# Patient Record
Sex: Female | Born: 1965 | Race: White | Hispanic: No | Marital: Married | Smoking: Former smoker
Health system: Southern US, Community
[De-identification: ages and names within clinical notes are randomized; demographics above are authoritative.]

## PROBLEM LIST (undated history)

## (undated) DIAGNOSIS — Z923 Personal history of irradiation: Secondary | ICD-10-CM

## (undated) DIAGNOSIS — K219 Gastro-esophageal reflux disease without esophagitis: Secondary | ICD-10-CM

## (undated) DIAGNOSIS — C50512 Malignant neoplasm of lower-outer quadrant of left female breast: Secondary | ICD-10-CM

## (undated) DIAGNOSIS — C801 Malignant (primary) neoplasm, unspecified: Secondary | ICD-10-CM

## (undated) DIAGNOSIS — Z9221 Personal history of antineoplastic chemotherapy: Secondary | ICD-10-CM

## (undated) DIAGNOSIS — Z803 Family history of malignant neoplasm of breast: Secondary | ICD-10-CM

## (undated) HISTORY — PX: BREAST LUMPECTOMY: SHX2

## (undated) HISTORY — PX: DILATION AND CURETTAGE OF UTERUS: SHX78

## (undated) HISTORY — DX: Malignant (primary) neoplasm, unspecified: C80.1

## (undated) HISTORY — DX: Family history of malignant neoplasm of breast: Z80.3

## (undated) HISTORY — DX: Gastro-esophageal reflux disease without esophagitis: K21.9

## (undated) HISTORY — DX: Malignant neoplasm of lower-outer quadrant of left female breast: C50.512

## (undated) HISTORY — PX: URETHRAL DILATION: SUR417

---

## 1998-08-23 ENCOUNTER — Ambulatory Visit (HOSPITAL_COMMUNITY): Admission: RE | Admit: 1998-08-23 | Discharge: 1998-08-23 | Payer: Self-pay | Admitting: Obstetrics and Gynecology

## 1998-12-08 ENCOUNTER — Inpatient Hospital Stay (HOSPITAL_COMMUNITY): Admission: AD | Admit: 1998-12-08 | Discharge: 1998-12-08 | Payer: Self-pay | Admitting: Obstetrics and Gynecology

## 1998-12-10 ENCOUNTER — Ambulatory Visit (HOSPITAL_COMMUNITY): Admission: RE | Admit: 1998-12-10 | Discharge: 1998-12-10 | Payer: Self-pay | Admitting: Obstetrics and Gynecology

## 1998-12-17 ENCOUNTER — Ambulatory Visit (HOSPITAL_COMMUNITY): Admission: AD | Admit: 1998-12-17 | Discharge: 1998-12-17 | Payer: Self-pay | Admitting: *Deleted

## 1999-01-26 ENCOUNTER — Other Ambulatory Visit: Admission: RE | Admit: 1999-01-26 | Discharge: 1999-01-26 | Payer: Self-pay | Admitting: Obstetrics and Gynecology

## 1999-10-15 ENCOUNTER — Inpatient Hospital Stay (HOSPITAL_COMMUNITY): Admission: AD | Admit: 1999-10-15 | Discharge: 1999-10-15 | Payer: Self-pay | Admitting: Obstetrics and Gynecology

## 1999-10-18 ENCOUNTER — Inpatient Hospital Stay (HOSPITAL_COMMUNITY): Admission: EM | Admit: 1999-10-18 | Discharge: 1999-10-18 | Payer: Self-pay | Admitting: Obstetrics and Gynecology

## 1999-10-18 ENCOUNTER — Encounter: Payer: Self-pay | Admitting: Obstetrics and Gynecology

## 1999-10-26 ENCOUNTER — Inpatient Hospital Stay (HOSPITAL_COMMUNITY): Admission: AD | Admit: 1999-10-26 | Discharge: 1999-10-29 | Payer: Self-pay | Admitting: Obstetrics and Gynecology

## 1999-11-02 ENCOUNTER — Encounter: Admission: RE | Admit: 1999-11-02 | Discharge: 2000-01-31 | Payer: Self-pay | Admitting: Obstetrics and Gynecology

## 1999-12-06 ENCOUNTER — Other Ambulatory Visit: Admission: RE | Admit: 1999-12-06 | Discharge: 1999-12-06 | Payer: Self-pay | Admitting: Obstetrics and Gynecology

## 2000-02-02 ENCOUNTER — Encounter: Admission: RE | Admit: 2000-02-02 | Discharge: 2000-05-02 | Payer: Self-pay | Admitting: Obstetrics and Gynecology

## 2001-04-07 ENCOUNTER — Other Ambulatory Visit: Admission: RE | Admit: 2001-04-07 | Discharge: 2001-04-07 | Payer: Self-pay | Admitting: Family Medicine

## 2001-05-29 ENCOUNTER — Encounter: Admission: RE | Admit: 2001-05-29 | Discharge: 2001-05-29 | Payer: Self-pay | Admitting: Family Medicine

## 2001-05-29 ENCOUNTER — Encounter: Payer: Self-pay | Admitting: Family Medicine

## 2001-09-18 ENCOUNTER — Other Ambulatory Visit: Admission: RE | Admit: 2001-09-18 | Discharge: 2001-09-18 | Payer: Self-pay | Admitting: Family Medicine

## 2002-10-29 ENCOUNTER — Other Ambulatory Visit: Admission: RE | Admit: 2002-10-29 | Discharge: 2002-10-29 | Payer: Self-pay | Admitting: Family Medicine

## 2003-11-25 ENCOUNTER — Other Ambulatory Visit: Admission: RE | Admit: 2003-11-25 | Discharge: 2003-11-25 | Payer: Self-pay | Admitting: Family Medicine

## 2005-01-18 ENCOUNTER — Other Ambulatory Visit: Admission: RE | Admit: 2005-01-18 | Discharge: 2005-01-18 | Payer: Self-pay | Admitting: Family Medicine

## 2006-02-14 ENCOUNTER — Other Ambulatory Visit: Admission: RE | Admit: 2006-02-14 | Discharge: 2006-02-14 | Payer: Self-pay | Admitting: Family Medicine

## 2007-03-27 ENCOUNTER — Other Ambulatory Visit: Admission: RE | Admit: 2007-03-27 | Discharge: 2007-03-27 | Payer: Self-pay | Admitting: Family Medicine

## 2013-07-02 ENCOUNTER — Ambulatory Visit (INDEPENDENT_AMBULATORY_CARE_PROVIDER_SITE_OTHER): Payer: BC Managed Care – PPO | Admitting: Nurse Practitioner

## 2013-07-02 ENCOUNTER — Encounter: Payer: Self-pay | Admitting: Nurse Practitioner

## 2013-07-02 VITALS — BP 126/77 | HR 72 | Temp 97.9°F | Ht 65.5 in | Wt 208.0 lb

## 2013-07-02 DIAGNOSIS — Z01419 Encounter for gynecological examination (general) (routine) without abnormal findings: Secondary | ICD-10-CM

## 2013-07-02 DIAGNOSIS — Z124 Encounter for screening for malignant neoplasm of cervix: Secondary | ICD-10-CM

## 2013-07-02 DIAGNOSIS — Z0000 Encounter for general adult medical examination without abnormal findings: Secondary | ICD-10-CM

## 2013-07-02 LAB — POCT URINALYSIS DIPSTICK
Bilirubin, UA: NEGATIVE
Blood, UA: NEGATIVE
Glucose, UA: NEGATIVE
Ketones, UA: NEGATIVE
Leukocytes, UA: NEGATIVE
Nitrite, UA: NEGATIVE
Spec Grav, UA: 1.01
Urobilinogen, UA: NEGATIVE
pH, UA: 7.5

## 2013-07-02 LAB — POCT CBC
Granulocyte percent: 63.3 %G (ref 37–80)
HCT, POC: 40.2 % (ref 37.7–47.9)
Hemoglobin: 14.3 g/dL (ref 12.2–16.2)
Lymph, poc: 2.9 (ref 0.6–3.4)
MCH, POC: 32.2 pg — AB (ref 27–31.2)
MCHC: 35.7 g/dL — AB (ref 31.8–35.4)
MCV: 90.2 fL (ref 80–97)
MPV: 8 fL (ref 0–99.8)
POC Granulocyte: 5.2 (ref 2–6.9)
POC LYMPH PERCENT: 35.4 %L (ref 10–50)
Platelet Count, POC: 299 10*3/uL (ref 142–424)
RBC: 4.5 M/uL (ref 4.04–5.48)
RDW, POC: 14.2 %
WBC: 8.2 10*3/uL (ref 4.6–10.2)

## 2013-07-02 LAB — POCT UA - MICROSCOPIC ONLY
Crystals, Ur, HPF, POC: NEGATIVE
Yeast, UA: NEGATIVE

## 2013-07-02 NOTE — Patient Instructions (Addendum)

## 2013-07-02 NOTE — Progress Notes (Signed)
  Subjective:    Patient ID: Shannon Logan, female    DOB: 1966/07/19, 47 y.o.   MRN: [id number]  HPI Pt here for annual physical with pap. Pt has no concerns or complaints at this time.     Review of Systems  All other systems reviewed and are negative.       Objective:   Physical Exam  Constitutional: She is oriented to person, place, and time. She appears well-developed and well-nourished.  HENT:  Head: Normocephalic.  Right Ear: External ear normal.  Left Ear: External ear normal.  Eyes: Pupils are equal, round, and reactive to light.  Neck: Normal range of motion. Neck supple. No thyromegaly present.  Cardiovascular: Normal rate, regular rhythm, normal heart sounds and intact distal pulses.   Pulmonary/Chest: Effort normal and breath sounds normal.  Abdominal: Soft. Bowel sounds are normal.  Musculoskeletal: Normal range of motion. She exhibits no edema and no tenderness.  Neurological: She is alert and oriented to person, place, and time. She has normal reflexes.  Skin: Skin is warm and dry.  Psychiatric: She has a normal mood and affect. Her behavior is normal. Judgment and thought content normal.      BP 126/77  Pulse 72  Temp(Src) 97.9 F (36.6 C) (Oral)  Ht 5' 5.5" (1.664 m)  Wt 208 lb (94.348 kg)  BMI 34.07 kg/m2     Assessment & Plan:  1. Annual physical exam Low fat diet and exercise encouraged - POCT urinalysis dipstick - POCT UA - Microscopic Only  2. Encounter for routine gynecological examination Pap pending   Orders Placed This Encounter  Procedures  . COMPLETE METABOLIC PANEL WITH GFR  . NMR Lipoprofile with Lipids  . Thyroid Panel With TSH  . POCT urinalysis dipstick  . POCT UA - Microscopic Only  . POCT CBC    Mary-Margaret Daphine Deutscher, FNP

## 2013-07-03 LAB — THYROID PANEL WITH TSH
Free Thyroxine Index: 2.9 (ref 1.0–3.9)
T3 Uptake: 35.7 % (ref 22.5–37.0)
T4, Total: 8.2 ug/dL (ref 5.0–12.5)
TSH: 3.704 u[IU]/mL (ref 0.350–4.500)

## 2013-07-03 LAB — COMPLETE METABOLIC PANEL WITH GFR
ALT: 24 U/L (ref 0–35)
AST: 23 U/L (ref 0–37)
Albumin: 4.3 g/dL (ref 3.5–5.2)
Alkaline Phosphatase: 117 U/L (ref 39–117)
BUN: 15 mg/dL (ref 6–23)
CO2: 32 mEq/L (ref 19–32)
Calcium: 9.5 mg/dL (ref 8.4–10.5)
Chloride: 106 mEq/L (ref 96–112)
Creat: 0.78 mg/dL (ref 0.50–1.10)
GFR, Est African American: >89 mL/min
GFR, Est Non African American: >89 mL/min
Glucose, Bld: 95 mg/dL (ref 70–99)
Potassium: 4.3 mEq/L (ref 3.5–5.3)
Sodium: 142 mEq/L (ref 135–145)
Total Bilirubin: 0.8 mg/dL (ref 0.3–1.2)
Total Protein: 7.2 g/dL (ref 6.0–8.3)

## 2013-07-05 LAB — PAP IG W/ RFLX HPV ASCU

## 2013-07-06 LAB — NMR LIPOPROFILE WITH LIPIDS
Cholesterol, Total: 190 mg/dL (ref <200)
HDL Particle Number: 31 umol/L (ref >=30.5)
HDL Size: 8.4 nm — ABNORMAL LOW (ref >=9.2)
HDL-C: 41 mg/dL (ref >=40)
LDL (calc): 114 mg/dL — ABNORMAL HIGH (ref <100)
LDL Particle Number: 1707 nmol/L — ABNORMAL HIGH (ref <1000)
LDL Size: 20.3 nm — ABNORMAL LOW (ref >20.5)
LP-IR Score: 81 — ABNORMAL HIGH (ref <=45)
Large HDL-P: 2 umol/L — ABNORMAL LOW (ref >=4.8)
Large VLDL-P: 5.6 nmol/L — ABNORMAL HIGH (ref <=2.7)
Small LDL Particle Number: 1059 nmol/L — ABNORMAL HIGH (ref <=527)
Triglycerides: 176 mg/dL — ABNORMAL HIGH (ref <150)
VLDL Size: 52.4 nm — ABNORMAL HIGH (ref <=46.6)

## 2013-08-27 ENCOUNTER — Telehealth: Payer: Self-pay | Admitting: Nurse Practitioner

## 2013-08-27 DIAGNOSIS — G8929 Other chronic pain: Secondary | ICD-10-CM

## 2013-08-27 DIAGNOSIS — D229 Melanocytic nevi, unspecified: Secondary | ICD-10-CM

## 2013-08-27 NOTE — Telephone Encounter (Signed)
Referrals made

## 2013-10-13 ENCOUNTER — Encounter: Payer: Self-pay | Admitting: Nurse Practitioner

## 2013-10-15 ENCOUNTER — Encounter: Payer: Self-pay | Admitting: Nurse Practitioner

## 2013-10-15 ENCOUNTER — Ambulatory Visit (INDEPENDENT_AMBULATORY_CARE_PROVIDER_SITE_OTHER): Payer: BC Managed Care – PPO | Admitting: Nurse Practitioner

## 2013-10-15 VITALS — BP 139/82 | HR 76 | Temp 97.5°F | Ht 65.5 in | Wt 204.5 lb

## 2013-10-15 DIAGNOSIS — Z1273 Encounter for screening for malignant neoplasm of ovary: Secondary | ICD-10-CM

## 2013-10-15 DIAGNOSIS — E785 Hyperlipidemia, unspecified: Secondary | ICD-10-CM

## 2013-10-15 NOTE — Patient Instructions (Signed)
Ovarian Cancer The ovaries are the parts of the female reproductive system that produce eggs. Women have two ovaries. They are located on either side of the uterus. Ovarian cancer is an abnormal growth of tissue (tumor) in one or both ovaries that is cancerous (malignant). Unlike noncancerous (benign) tumors, malignant tumors can spread to other parts of your body.  CAUSES  The exact cause of ovarian cancer is unknown. RISK FACTORS There are a number of risk factors that can increase your chances of getting ovarian cancer. They include:  Age. Ovarian cancer is most common in women aged 49 75 years.  Being Caucasian.  Personal or family history of endometrial, colon, breast, or ovarian cancer.  Having the BRCA 1 and BRCA 2 genes.  Using fertility medicines.  Starting menstruation before the age of 12 years.  Starting menopause after the age of 14 years.  Becoming pregnant for the first time at the age of 39 years or older.  Never being pregnant.  Having hormone replacement therapy.  Eating high amounts of animal fat. SYMPTOMS  Early ovarian cancer often does not cause symptoms. As the cancer grows, symptoms may include:  Unexplained weight loss.  Abdominal pain, swelling, or bloating.  Pain and pressure in the back and pelvis.  Abnormal vaginal bleeding.  Loss of appetite.  Frequent urination.  Pain during intercourse.  Fatigue. DIAGNOSIS  Your caregiver will ask about your medical history. He or she may also perform a number of procedures, such as:  A pelvic exam. Your caregiver will feel the organs in the pelvis for any lumps or changes in their shape or size.  Imaging tests, such as computed tomography (CT) scans, ultrasound tests, or magnetic resonance imaging (MRI).  Blood tests. Your cancer will be staged to determine its severity and extent. Staging is a careful attempt to find out the size of the tumor, whether the cancer has spread, and if so, to what  parts of the body. You may need to have more tests to determine the stage of your cancer. The test results will help determine what treatment plan is best for you. STAGES   Stage I. The cancer is found in only one or both ovaries.  Stage II. The cancer has spread to other parts of the pelvis, such as the uterus or fallopian tubes.  Stage III. The cancer has spread outside the pelvis to the abdominal cavity or to the lymph nodes in the abdomen.  Stage IV. The cancer has spread outside the abdomen to areas such as the liver or lungs. TREATMENT  Most women with ovarian cancer are treated with a combination of surgery and chemotherapy.   If the cancer is found at an early stage, surgery may be done to remove one ovary and its fallopian tube.  For more advanced cases, surgery may be done to remove the uterus, cervix, fallopian tubes, and ovaries. Your caregiver may also remove the lymph nodes near the tumor and some tissue in the abdomen.  Chemotherapy is the use of drugs to kill cancer cells. Chemotherapy may be used before or after the surgery. HOME CARE INSTRUCTIONS   Only take over-the-counter or prescription medicines as directed by your caregiver.  Maintain a healthy diet.  Consider joining a support group. If you are feeling stressed because you have ovarian cancer, a support group may help you learn to cope with the disease.  Seek advice to help you manage the treatment of side effects.  Keep all follow-up appointments as  directed by your caregiver. SEEK IMMEDIATE MEDICAL CARE IF:   You have new or worsening symptoms.  You have a fever during your chemotherapy treatment. Document Released: 10/22/2004 Document Revised: 11/18/2012 Document Reviewed: 08/20/2012 [facility] Patient Information 2014 [place], [place].

## 2013-10-15 NOTE — Progress Notes (Signed)
  Subjective:    Patient ID: Shannon Logan, female    DOB: [birth year], 47 y.o.   MRN: [id number]  Hyperlipidemia This is a chronic problem. The current episode started more than 1 year ago. The problem is uncontrolled. Recent lipid tests were reviewed and are high. She has no history of diabetes, hypothyroidism, liver disease, obesity or nephrotic syndrome. There are no known factors aggravating her hyperlipidemia. Pertinent negatives include no focal sensory loss, focal weakness, leg pain, myalgias or shortness of breath. Current antihyperlipidemic treatment includes diet change.   *patient concerned about blotting and slight pain with intercourse- would like to be checked for ovarian Cancer.    Review of Systems  Respiratory: Negative for shortness of breath.   Musculoskeletal: Negative for myalgias.  Neurological: Negative for focal weakness.  All other systems reviewed and are negative.       Objective:   Physical Exam  Constitutional: She is oriented to person, place, and time. She appears well-developed and well-nourished.  HENT:  Right Ear: External ear normal.  Left Ear: External ear normal.  Nose: Nose normal.  Mouth/Throat: Oropharynx is clear and moist.  Eyes: EOM are normal. Pupils are equal, round, and reactive to light.  Neck: Normal range of motion. Neck supple.  Cardiovascular: Normal rate and normal heart sounds.   Pulmonary/Chest: Effort normal and breath sounds normal.  Abdominal: Soft. Bowel sounds are normal.  Musculoskeletal: Normal range of motion.  Neurological: She is alert and oriented to person, place, and time.  Psychiatric: She has a normal mood and affect. Her behavior is normal. Judgment and thought content normal.    BP 139/82  Pulse 76  Temp(Src) 97.5 F (36.4 C) (Oral)  Ht 5' 5.5" (1.664 m)  Wt 204 lb 8 oz (92.761 kg)  BMI 33.5 kg/m2       Assessment & Plan:   1. Hyperlipidemia LDL goal < 100   2. Ovarian cancer screening     Orders Placed This Encounter  Procedures  . CMP14+EGFR  . NMR, lipoprofile  . CA 125     Continue all meds Labs pending Diet and exercise encouraged Health maintenance reviewed Follow up in 6 months  Mary-Margaret Daphine Deutscher, FNP

## 2013-10-17 LAB — NMR, LIPOPROFILE
Cholesterol: 171 mg/dL
HDL Cholesterol by NMR: 46 mg/dL
HDL Particle Number: 33.2 umol/L
LDL Particle Number: 1442 nmol/L — ABNORMAL HIGH
LDL Size: 20.3 nm — ABNORMAL LOW
LDLC SERPL CALC-MCNC: 103 mg/dL — ABNORMAL HIGH
LP-IR Score: 71 — ABNORMAL HIGH
Small LDL Particle Number: 742 nmol/L — ABNORMAL HIGH
Triglycerides by NMR: 109 mg/dL

## 2013-10-17 LAB — CMP14+EGFR
ALT: 30 [IU]/L (ref 0–32)
AST: 24 [IU]/L (ref 0–40)
Albumin/Globulin Ratio: 1.9 (ref 1.1–2.5)
Albumin: 4.6 g/dL (ref 3.5–5.5)
Alkaline Phosphatase: 121 [IU]/L — ABNORMAL HIGH (ref 39–117)
BUN/Creatinine Ratio: 19 (ref 9–23)
BUN: 15 mg/dL (ref 6–24)
CO2: 26 mmol/L (ref 18–29)
Calcium: 10.3 mg/dL — ABNORMAL HIGH (ref 8.7–10.2)
Chloride: 101 mmol/L (ref 97–108)
Creatinine, Ser: 0.78 mg/dL (ref 0.57–1.00)
GFR calc Af Amer: 104 mL/min/{1.73_m2}
GFR calc non Af Amer: 90 mL/min/{1.73_m2}
Globulin, Total: 2.4 g/dL (ref 1.5–4.5)
Glucose: 99 mg/dL (ref 65–99)
Potassium: 4.8 mmol/L (ref 3.5–5.2)
Sodium: 142 mmol/L (ref 134–144)
Total Bilirubin: 0.7 mg/dL (ref 0.0–1.2)
Total Protein: 7 g/dL (ref 6.0–8.5)

## 2013-12-27 ENCOUNTER — Telehealth: Payer: Self-pay | Admitting: Nurse Practitioner

## 2013-12-27 NOTE — Telephone Encounter (Signed)
appt made

## 2013-12-31 ENCOUNTER — Ambulatory Visit (INDEPENDENT_AMBULATORY_CARE_PROVIDER_SITE_OTHER): Payer: Self-pay | Admitting: Family Medicine

## 2013-12-31 ENCOUNTER — Encounter: Payer: Self-pay | Admitting: Family Medicine

## 2013-12-31 ENCOUNTER — Ambulatory Visit (INDEPENDENT_AMBULATORY_CARE_PROVIDER_SITE_OTHER): Payer: BC Managed Care – PPO

## 2013-12-31 VITALS — BP 125/77 | HR 74 | Temp 98.0°F | Wt 203.6 lb

## 2013-12-31 DIAGNOSIS — R0781 Pleurodynia: Secondary | ICD-10-CM

## 2013-12-31 DIAGNOSIS — R079 Chest pain, unspecified: Secondary | ICD-10-CM

## 2013-12-31 MED ORDER — NAPROXEN 500 MG PO TABS: 500.0000 mg | ORAL_TABLET | Freq: Two times a day (BID) | ORAL | Status: DC

## 2013-12-31 MED ORDER — CYCLOBENZAPRINE HCL 10 MG PO TABS: 10.0000 mg | ORAL_TABLET | Freq: Three times a day (TID) | ORAL | Status: DC | PRN

## 2013-12-31 NOTE — Progress Notes (Signed)
   Subjective:    Patient ID: Shannon Logan, female    DOB: 1966/04/25, 48 y.o.   MRN: [id number]  HPI  This 48 y.o. female presents for evaluation of pain in her left arm pit after she fell a few days ago. She has been seen by urgent care and she was rx'd naprosyn and muscle relaxer flexeril. She had xray of ribs and no fx.  Review of Systems  C/o left lateral chest wall discomfort No chest pain, SOB, HA, dizziness, vision change, N/V, diarrhea, constipation, dysuria, urinary urgency or frequency, myalgias, arthralgias or rash.     Objective:   Physical Exam  Vital signs noted  Well developed well nourished female.  HEENT - Head atraumatic Normocephalic                Eyes - PERRLA, Conjuctiva - clear Sclera- Clear EOMI Respiratory - Lungs CTA bilateral Cardiac - RRR S1 and S2 without murmur GI - Abdomen soft Nontender and bowel sounds active x 4 MS - TTP Left lateral intercostal area proximal left axilla    Left rib series - Negative for fx Assessment & Plan:  Rib pain on left side - Plan: DG Ribs Unilateral Left, naproxen (NAPROSYN) 500 MG tablet, cyclobenzaprine (FLEXERIL) 10 MG tablet  Lysbeth Penner FNP

## 2013-12-31 NOTE — Patient Instructions (Signed)

## 2015-07-11 ENCOUNTER — Encounter: Payer: Self-pay | Admitting: Family Medicine

## 2015-07-11 ENCOUNTER — Ambulatory Visit (INDEPENDENT_AMBULATORY_CARE_PROVIDER_SITE_OTHER): Payer: 59 | Admitting: Family Medicine

## 2015-07-11 VITALS — BP 116/70 | HR 69 | Temp 97.8°F | Ht 65.5 in | Wt 211.0 lb

## 2015-07-11 DIAGNOSIS — R300 Dysuria: Secondary | ICD-10-CM | POA: Diagnosis not present

## 2015-07-11 DIAGNOSIS — R1011 Right upper quadrant pain: Secondary | ICD-10-CM | POA: Insufficient documentation

## 2015-07-11 LAB — POCT URINALYSIS DIPSTICK
Bilirubin, UA: NEGATIVE
GLUCOSE UA: NEGATIVE
Ketones, UA: NEGATIVE
Leukocytes, UA: NEGATIVE
Nitrite, UA: NEGATIVE
PROTEIN UA: NEGATIVE
RBC UA: NEGATIVE
Urobilinogen, UA: NEGATIVE

## 2015-07-11 LAB — POCT UA - MICROSCOPIC ONLY
BACTERIA, U MICROSCOPIC: NEGATIVE
CASTS, UR, LPF, POC: NEGATIVE
CRYSTALS, UR, HPF, POC: NEGATIVE
Mucus, UA: NEGATIVE
YEAST UA: NEGATIVE

## 2015-07-11 MED ORDER — CIPROFLOXACIN HCL 250 MG PO TABS: 250.0000 mg | ORAL_TABLET | Freq: Two times a day (BID) | ORAL | Status: DC

## 2015-07-11 NOTE — Progress Notes (Signed)
   Subjective:    Patient ID: Shannon Logan, female    DOB: Jul 27, 1966, 49 y.o.   MRN: [id number]  HPI 49 year old female with dysuria and scant urination for the past 24 hours. She has a history of bladder infections. She's had no fever chills nausea or vomiting. She has been taking Azo for relief of symptoms. There are no active problems to display for this patient.  Outpatient Encounter Prescriptions as of 07/11/2015  Medication Sig  . Phenazopyridine HCl (AZO TABS PO) Take by mouth.  . [DISCONTINUED] cyclobenzaprine (FLEXERIL) 10 MG tablet Take 1 tablet (10 mg total) by mouth 3 (three) times daily as needed for muscle spasms.  . [DISCONTINUED] naproxen (NAPROSYN) 500 MG tablet Take 1 tablet (500 mg total) by mouth 2 (two) times daily with a meal.   No facility-administered encounter medications on file as of 07/11/2015.      Review of Systems  Respiratory: Negative.   Cardiovascular: Negative.   Gastrointestinal: Positive for abdominal pain.  Genitourinary: Positive for dysuria.       Objective:   Physical Exam  Constitutional: She appears well-developed and well-nourished.  Pulmonary/Chest: Effort normal.  Abdominal: Soft. She exhibits no distension. There is no tenderness.    BP 116/70 mmHg  Pulse 69  Temp(Src) 97.8 F (36.6 C) (Oral)  Ht 5' 5.5" (1.664 m)  Wt 211 lb (95.709 kg)  BMI 34.57 kg/m2       Assessment & Plan:  1. Dysuria Again Cipro pending result of culture since this has worked before. Asked her to drink plenty water and hold off on the days ago in an effort to gauge the effectiveness of Cipro - POCT urinalysis dipstick - POCT UA - Microscopic Only - Urine culture 2. Pain Pain is mostly right upper quadrant associated with bloating after meals. Will check for gallbladder disease. If ultrasound is negative refer to GI for further evaluation  Wardell Honour MD

## 2015-07-13 LAB — URINE CULTURE

## 2015-07-17 ENCOUNTER — Telehealth: Payer: Self-pay | Admitting: *Deleted

## 2015-07-17 NOTE — Telephone Encounter (Signed)
-----   Message from Wardell Honour, MD sent at 07/15/2015 11:05 PM EDT ----- E coli; Cipro will work

## 2015-07-17 NOTE — Telephone Encounter (Signed)
Tried to call pt regarding test results but vm full couldn't leave a message.

## 2015-07-20 ENCOUNTER — Telehealth: Payer: Self-pay | Admitting: *Deleted

## 2015-07-20 NOTE — Telephone Encounter (Signed)
Multiple attempts to call patient.  This encounter will now be closed

## 2015-07-20 NOTE — Telephone Encounter (Signed)
-----   Message from Wardell Honour, MD sent at 07/15/2015 11:05 PM EDT ----- E coli; Cipro will work

## 2015-07-21 ENCOUNTER — Telehealth: Payer: Self-pay | Admitting: Family Medicine

## 2015-07-28 ENCOUNTER — Telehealth: Payer: Self-pay | Admitting: Family Medicine

## 2015-07-28 ENCOUNTER — Ambulatory Visit (HOSPITAL_COMMUNITY)
Admission: RE | Admit: 2015-07-28 | Discharge: 2015-07-28 | Disposition: A | Discharge status: Home / Self Care | Payer: 59 | Source: Ambulatory Visit | Attending: Family Medicine | Admitting: Family Medicine

## 2015-07-28 DIAGNOSIS — K769 Liver disease, unspecified: Secondary | ICD-10-CM | POA: Insufficient documentation

## 2015-07-28 DIAGNOSIS — R140 Abdominal distension (gaseous): Secondary | ICD-10-CM | POA: Insufficient documentation

## 2015-07-28 DIAGNOSIS — R1011 Right upper quadrant pain: Secondary | ICD-10-CM | POA: Diagnosis not present

## 2015-07-28 DIAGNOSIS — R300 Dysuria: Secondary | ICD-10-CM

## 2015-07-28 NOTE — Telephone Encounter (Signed)
[facility] to [facility]

## 2015-07-31 ENCOUNTER — Telehealth: Payer: Self-pay | Admitting: Family Medicine

## 2015-07-31 NOTE — Telephone Encounter (Signed)
Patient called and stated she is still having abdominal bloating and pain.  Ultrasound was negative.  She would like to know what she needs to do now.

## 2015-07-31 NOTE — Telephone Encounter (Signed)
[facility] to X-ray

## 2015-08-01 NOTE — Telephone Encounter (Signed)
Plan was to refer to GI for further evaluation if ultrasound showed no gallstones

## 2015-08-02 ENCOUNTER — Other Ambulatory Visit: Payer: Self-pay | Admitting: *Deleted

## 2015-08-02 DIAGNOSIS — R1011 Right upper quadrant pain: Secondary | ICD-10-CM

## 2015-08-03 ENCOUNTER — Encounter (INDEPENDENT_AMBULATORY_CARE_PROVIDER_SITE_OTHER): Payer: Self-pay | Admitting: *Deleted

## 2015-08-31 ENCOUNTER — Telehealth: Payer: Self-pay | Admitting: Family Medicine

## 2015-09-01 ENCOUNTER — Encounter (INDEPENDENT_AMBULATORY_CARE_PROVIDER_SITE_OTHER): Payer: Self-pay | Admitting: *Deleted

## 2015-09-01 ENCOUNTER — Ambulatory Visit (INDEPENDENT_AMBULATORY_CARE_PROVIDER_SITE_OTHER): Payer: 59 | Admitting: Internal Medicine

## 2015-09-01 ENCOUNTER — Encounter (INDEPENDENT_AMBULATORY_CARE_PROVIDER_SITE_OTHER): Payer: Self-pay | Admitting: Internal Medicine

## 2015-09-01 VITALS — BP 128/76 | HR 72 | Temp 98.6°F | Ht 64.5 in | Wt 212.8 lb

## 2015-09-01 DIAGNOSIS — K760 Fatty (change of) liver, not elsewhere classified: Secondary | ICD-10-CM

## 2015-09-01 DIAGNOSIS — R1013 Epigastric pain: Secondary | ICD-10-CM | POA: Diagnosis not present

## 2015-09-01 LAB — HEPATITIS PANEL, ACUTE
HCV Ab: NEGATIVE
HEP A IGM: NONREACTIVE
HEP B C IGM: NONREACTIVE
Hepatitis B Surface Ag: NEGATIVE

## 2015-09-01 NOTE — Patient Instructions (Addendum)
Diet and exercise. Hida scan., Acute Hepatitis Panel

## 2015-09-01 NOTE — Progress Notes (Addendum)
   Subjective:    Patient ID: Shannon Logan, female    DOB: 1966/11/15, 49 y.o.   MRN: [id number]  HPI Referred to our office by Dr. Lillette Boxer. Miller for rt upper quadrant pain.  She has bloating after she eats. She says the bloating occurs immediately after eating. She says she will bloat so bad she will have pressure in her back. Symptoms for a couple of years.  Sometimes she has ruq pain.  She says she will have acid reflux depending on her eating habits. Appetite is good. No weight loss.  No lower abdominal pain.  She usually has a BM one a day and then she may not go but 1-2 times a week. No melena. She has tried Miralax but she will have diarrhea. Her father died with ischemic bowel.  One tattoo on rt foot and a belly ring.  07/28/2015 [place] RUQ:  Rt upper quadrant pain.  IMPRESSION: Liver demonstrates increase echogenicity with coarse echotexture consistent with fatty infiltration and/or hepatocellular disease. Exam otherwise negative. No gallstones noted.  CMP     Component Value Date/Time   NA 142 10/15/2013 1025   NA 142 07/02/2013 1448   K 4.8 10/15/2013 1025   CL 101 10/15/2013 1025   CO2 26 10/15/2013 1025   GLUCOSE 99 10/15/2013 1025   GLUCOSE 95 07/02/2013 1448   BUN 15 10/15/2013 1025   BUN 15 07/02/2013 1448   CREATININE 0.78 10/15/2013 1025   CREATININE 0.78 07/02/2013 1448   CALCIUM 10.3* 10/15/2013 1025   PROT 7.0 10/15/2013 1025   PROT 7.2 07/02/2013 1448   ALBUMIN 4.3 07/02/2013 1448   AST 24 10/15/2013 1025   ALT 30 10/15/2013 1025   ALKPHOS 121* 10/15/2013 1025   BILITOT 0.7 10/15/2013 1025   GFRNONAA 90 10/15/2013 1025   GFRNONAA >89 07/02/2013 1448   GFRAA 104 10/15/2013 1025   GFRAA >89 07/02/2013 1448        Review of Systems No past medical history on file.  Past Surgical History  Procedure Laterality Date  . Urethral dilation      Allergies  Allergen Reactions  . Asa [Aspirin]   . Prednisone   . Sulfa Antibiotics     No  current outpatient prescriptions on file prior to visit.   No current facility-administered medications on file prior to visit.        Objective:   Physical ExamBlood pressure 128/76, pulse 72, temperature 98.6 F (37 C), height 5' 4.5" (1.638 m), weight 212 lb 12.8 oz (96.525 kg). Alert and oriented. Skin warm and dry. Oral mucosa is moist.   . Sclera anicteric, conjunctivae is pink. Thyroid not enlarged. No cervical lymphadenopathy. Lungs clear. Heart regular rate and rhythm.  Abdomen is soft. Bowel sounds are positive. No hepatomegaly. No abdominal masses felt. No tenderness.  No edema to lower extremities.  .        Assessment & Plan:  Epigastric pain, bloating. Am going to rule out GB disease. She will be due for a screenin colonoscopy in October. Am going to hold colonoscopy till I do a HIDA. If HIDA scan normal, proceed with EGD and colonoscopy. GERD diet given to patient. Take Zantac 150mg  Daily 30 minutes before breakfast. Elevated ALP: Am going to get an Acute Hepatitis Panel.

## 2015-09-04 ENCOUNTER — Ambulatory Visit (INDEPENDENT_AMBULATORY_CARE_PROVIDER_SITE_OTHER): Payer: 59 | Admitting: Internal Medicine

## 2015-09-08 ENCOUNTER — Encounter (HOSPITAL_COMMUNITY)
Admission: RE | Admit: 2015-09-08 | Discharge: 2015-09-08 | Disposition: A | Discharge status: Home / Self Care | Payer: 59 | Source: Ambulatory Visit | Attending: Internal Medicine | Admitting: Internal Medicine

## 2015-09-08 ENCOUNTER — Encounter (HOSPITAL_COMMUNITY): Payer: Self-pay

## 2015-09-08 DIAGNOSIS — K760 Fatty (change of) liver, not elsewhere classified: Secondary | ICD-10-CM

## 2015-09-08 DIAGNOSIS — R1013 Epigastric pain: Secondary | ICD-10-CM

## 2015-09-08 MED ORDER — TECHNETIUM TC 99M MEBROFENIN IV KIT
5.0000 | PACK | Freq: Once | INTRAVENOUS | Status: DC | PRN
  Administered 2015-09-08: 5.1 via INTRAVENOUS
  Filled 2015-09-08: qty 6

## 2015-09-08 MED ORDER — SODIUM CHLORIDE 0.9 % IJ SOLN
INTRAMUSCULAR | Status: AC
  Filled 2015-09-08: qty 12

## 2015-09-08 MED ORDER — STERILE WATER FOR INJECTION IJ SOLN
INTRAMUSCULAR | Status: AC
  Administered 2015-09-08: 1.88 mL via INTRAVENOUS
  Filled 2015-09-08: qty 10

## 2015-09-08 MED ORDER — SINCALIDE 5 MCG IJ SOLR
INTRAMUSCULAR | Status: AC
  Administered 2015-09-08: 1.88 ug via INTRAVENOUS
  Filled 2015-09-08: qty 5

## 2015-09-13 ENCOUNTER — Telehealth (INDEPENDENT_AMBULATORY_CARE_PROVIDER_SITE_OTHER): Payer: Self-pay | Admitting: *Deleted

## 2015-09-13 ENCOUNTER — Encounter (INDEPENDENT_AMBULATORY_CARE_PROVIDER_SITE_OTHER): Payer: Self-pay | Admitting: *Deleted

## 2015-09-13 DIAGNOSIS — K760 Fatty (change of) liver, not elsewhere classified: Secondary | ICD-10-CM

## 2015-09-13 DIAGNOSIS — R1013 Epigastric pain: Secondary | ICD-10-CM

## 2015-09-13 NOTE — Telephone Encounter (Signed)
.  Per Shannon Logan patient is to have labs in 1 month.

## 2015-09-21 ENCOUNTER — Encounter (INDEPENDENT_AMBULATORY_CARE_PROVIDER_SITE_OTHER): Payer: Self-pay | Admitting: *Deleted

## 2015-09-22 ENCOUNTER — Ambulatory Visit (INDEPENDENT_AMBULATORY_CARE_PROVIDER_SITE_OTHER): Payer: 59 | Admitting: Pediatrics

## 2015-09-22 ENCOUNTER — Encounter: Payer: Self-pay | Admitting: Pediatrics

## 2015-09-22 VITALS — BP 135/80 | HR 69 | Temp 97.5°F | Ht 65.0 in | Wt 207.8 lb

## 2015-09-22 DIAGNOSIS — Z0000 Encounter for general adult medical examination without abnormal findings: Secondary | ICD-10-CM

## 2015-09-22 DIAGNOSIS — Z6834 Body mass index (BMI) 34.0-34.9, adult: Secondary | ICD-10-CM

## 2015-09-22 DIAGNOSIS — Z124 Encounter for screening for malignant neoplasm of cervix: Secondary | ICD-10-CM

## 2015-09-22 DIAGNOSIS — Z23 Encounter for immunization: Secondary | ICD-10-CM

## 2015-09-22 DIAGNOSIS — M25551 Pain in right hip: Secondary | ICD-10-CM | POA: Diagnosis not present

## 2015-09-22 DIAGNOSIS — Z6832 Body mass index (BMI) 32.0-32.9, adult: Secondary | ICD-10-CM | POA: Insufficient documentation

## 2015-09-22 NOTE — Patient Instructions (Signed)
Epley Maneuver Self-Care WHAT IS THE EPLEY MANEUVER? The Epley maneuver is an exercise you can do to relieve symptoms of benign paroxysmal positional vertigo (BPPV). This condition is often just referred to as vertigo. BPPV is caused by the movement of tiny crystals (canaliths) inside your inner ear. The accumulation and movement of canaliths in your inner ear causes a sudden spinning sensation (vertigo) when you move your head to certain positions. Vertigo usually lasts about 30 seconds. BPPV usually occurs in just one ear. If you get vertigo when you lie on your left side, you probably have BPPV in your left ear. Your health care provider can tell you which ear is involved.  BPPV may be caused by a head injury. Many people older than 50 get BPPV for unknown reasons. If you have been diagnosed with BPPV, your health care provider may teach you how to do this maneuver. BPPV is not life threatening (benign) and usually goes away in time.  WHEN SHOULD I PERFORM THE EPLEY MANEUVER? You can do this maneuver at home whenever you have symptoms of vertigo. You may do the Epley maneuver up to 3 times a day until your symptoms of vertigo go away. HOW SHOULD I DO THE EPLEY MANEUVER?  Sit on the edge of a bed or table with your back straight. Your legs should be extended or hanging over the edge of the bed or table.   Turn your head halfway toward the affected ear.   Lie backward quickly with your head turned until you are lying flat on your back. You may want to position a pillow under your shoulders.   Hold this position for 30 seconds. You may experience an attack of vertigo. This is normal. Hold this position until the vertigo stops.  Then turn your head to the opposite direction until your unaffected ear is facing the floor.   Hold this position for 30 seconds. You may experience an attack of vertigo. This is normal. Hold this position until the vertigo stops.  Now turn your whole body to the same  side as your head. Hold for another 30 seconds.   You can then sit back up. ARE THERE RISKS TO THIS MANEUVER? In some cases, you may have other symptoms (such as changes in your vision, weakness, or numbness). If you have these symptoms, stop doing the maneuver and call your health care provider. Even if doing these maneuvers relieves your vertigo, you may still have dizziness. Dizziness is the sensation of light-headedness but without the sensation of movement. Even though the Epley maneuver may relieve your vertigo, it is possible that your symptoms will return within 5 years. WHAT SHOULD I DO AFTER THIS MANEUVER? After doing the Epley maneuver, you can return to your normal activities. Ask your doctor if there is anything you should do at home to prevent vertigo. This may include:  Sleeping with two or more pillows to keep your head elevated.  Not sleeping on the side of your affected ear.  Getting up slowly from bed.  Avoiding sudden movements during the day.  Avoiding extreme head movement, like looking up or bending over.  Wearing a cervical collar to prevent sudden head movements. WHAT SHOULD I DO IF MY SYMPTOMS GET WORSE? Call your health care provider if your vertigo gets worse. Call your provider right way if you have other symptoms, including:   Nausea.  Vomiting.  Headache.  Weakness.  Numbness.  Vision changes.   This information is not intended  to replace advice given to you by your health care provider. Make sure you discuss any questions you have with your health care provider.   Document Released: 12/07/2013 Document Reviewed: 12/07/2013 Elsevier Interactive Patient Education 2016 [place] Band Syndrome With Rehab The iliotibial (IT) band is a tendon that connects the hip muscles to the shinbone (tibia) and to one of the bones of the pelvis (ileum). The IT band passes by the knee and is often irritated by the outer portion of the knee (lateral  femoral condyle). A fluid filled sac (bursa) exists between the tendon and the bone, to cushion and reduce friction. Overuse of the tendon may cause excessive friction, which results in IT band syndrome. This condition involves inflammation of the bursa (bursitis) and/or inflammation of the IT band (tendinitis). SYMPTOMS   Pain, tenderness, swelling, warmth, or redness over the IT band, at the outer knee (above the joint).  Pain that travels up or down the thigh or leg.  Initially, pain at the beginning of an exercise, that decreases once warmed up. Eventually, pain throughout the activity, getting worse as the activity continues. May cause the athlete to stop in the middle of training or competing.  Pain that gets worse when running down hills or stairs, on banked tracks, or next to the curb on the street.  Pain that increases when the foot of the affected leg hits the ground.  Possibly, a crackling sound (crepitation) when the tendon or bursa is moved or touched. CAUSES  IT band syndrome is caused by irritation of the IT band and the underlying bursa. This eventually results in inflammation and pain. IT band syndrome is an overuse injury.  RISK INCREASES WITH:  Sports with repetitive knee-bending activities (distance running, cycling).  Incorrect training techniques, including sudden changes in the intensity, frequency, or duration of training.  Not enough rest between workouts.  Poor strength and flexibility, especially a tight IT band.  Failure to warm up properly before activity.  Bow legs.  Arthritis of the knee. PREVENTION   Warm up and stretch properly before activity.  Allow for adequate recovery between workouts.  Maintain physical fitness:  Strength, flexibility, and endurance.  Cardiovascular fitness.  Learn and use proper training technique, including reducing running mileage, shortening stride, and avoiding running on hills and banked surfaces.  Wear arch  supports (orthotics), if you have flat feet. PROGNOSIS  If treated properly, IT band syndrome usually goes away within 6 weeks of treatment. RELATED COMPLICATIONS   Longer healing time, if not properly treated, or if not given enough time to heal.  Recurring inflammation of the tendon and bursa, that may result in a chronic condition.  Recurring symptoms, if activity is resumed too soon, with overuse, with a direct blow, or with poor training technique.  Inability to complete training or competition. TREATMENT  Treatment first involves the use of ice and medicine, to reduce pain and inflammation. The use of strengthening and stretching exercises may help reduce pain with activity. These exercises may be performed at home or with a therapist. For individuals with flat feet, an arch support (orthotic) may be helpful. Some individuals find that wearing a knee sleeve or compression bandage around the knee during workouts provides some relief. Certain training techniques, such as adjusting stride length, avoiding running on hills or stairs, changing the direction you run on a circular or banked track, or changing the side of the road you run on, if you run next to the curb,  may help decrease symptoms of IT band syndrome. Cyclists may need to change the seat height or foot position on their bicycles. An injection of cortisone into the bursa may be recommended. Surgery to remove the inflamed bursa and/or part of the IT band is only considered after at least 6 months of non-surgical treatment.  MEDICATION   If pain medicine is needed, nonsteroidal anti-inflammatory medicines (aspirin and ibuprofen), or other minor pain relievers (acetaminophen), are often advised.  Do not take pain medicine for 7 days before surgery.  Prescription pain relievers may be given, if your caregiver thinks they are needed. Use only as directed and only as much as you need.  Corticosteroid injections may be given by your  caregiver. These injections should be reserved for the most serious cases, because they may only be given a certain number of times. HEAT AND COLD  Cold treatment (icing) should be applied for 10 to 15 minutes every 2 to 3 hours for inflammation and pain, and immediately after activity that aggravates your symptoms. Use ice packs or an ice massage.  Heat treatment may be used before performing stretching and strengthening activities prescribed by your caregiver, physical therapist, or athletic trainer. Use a heat pack or a warm water soak. SEEK MEDICAL CARE IF:   Symptoms get worse or do not improve in 2 to 4 weeks, despite treatment.  New, unexplained symptoms develop. (Drugs used in treatment may produce side effects.) EXERCISES  RANGE OF MOTION (ROM) AND STRETCHING EXERCISES - Iliotibial Band Syndrome These exercises may help you when beginning to rehabilitate your injury. Your symptoms may go away with or without further involvement from your physician, physical therapist or athletic trainer. While completing these exercises, remember:   Restoring tissue flexibility helps normal motion to return to the joints. This allows healthier, less painful movement and activity.  An effective stretch should be held for at least 30 seconds.  A stretch should never be painful. You should only feel a gentle lengthening or release in the stretched tissue. STRETCH - Quadriceps, Prone   Lie on your stomach on a firm surface, such as a bed or padded floor.  Bend your right / left knee and grasp your ankle. If you are unable to reach your ankle or pant leg, use a belt around your foot to lengthen your reach.  Gently pull your heel toward your buttocks. Your knee should not slide out to the side. You should feel a stretch in the front of your thigh and knee.  Hold this position for __________ seconds. Repeat __________ times. Complete this stretch __________ times per day.  STRETCH - Iliotibial  Band  On the floor or bed, lie on your side, so your right / left leg is on top. Bend your knee and grab your ankle.  Slowly bring your knee back so that your thigh is in line with your trunk. Keep your heel at your buttocks and gently arch your back, so your head, shoulders and hips line up.  Slowly lower your leg so that your knee approaches the floor or bed, until you feel a gentle stretch on the outside of your right / left thigh. If you do not feel a stretch and your knee will not fall farther, place the heel of your opposite foot on top of your knee, and pull your thigh down farther.  Hold this stretch for __________ seconds. Repeat __________ times. Complete this stretch __________ times per day. STRENGTHENING EXERCISES - Iliotibial Band Syndrome Improving  the flexibility of the IT band will best relieve your discomfort due to IT band syndrome. Strengthening exercises, however, can help improve both muscle endurance and joint mechanics, reducing the factors that can contribute to this condition. Your physician, physical therapist or athletic trainer may provide you with exercises that train specific muscle groups that are especially weak. The following exercises target muscles that are often weak in people who have IT band syndrome. STRENGTH - Hip Abductors, Straight Leg Raises  Be aware of your form throughout the entire exercise, so that you exercise the correct muscles. Poor form means that you are not strengthening the correct muscles.  Lie on your side, so that your head, shoulders, knee and hip line up. You may bend your lower knee to help maintain your balance. Your right / left leg should be on top.  Roll your hips slightly forward, so that your hips are stacked directly over each other and your right / left knee is facing forward.  Lift your top leg up 4-6 inches, leading with your heel. Be sure that your foot does not drift forward and that your knee does not roll toward the  ceiling.  Hold this position for __________ seconds. You should feel the muscles in your outer hip lifting (you may not notice this until your leg begins to tire).  Slowly lower your leg to the starting position. Allow the muscles to fully relax before beginning the next repetition. Repeat __________ times. Complete this exercise __________ times per day.  STRENGTH - Quad/VMO, Isometric  Sit in a chair with your right / left knee slightly bent. With your fingertips, feel the VMO muscle (just above the inside of your knee). The VMO is important in controlling the position of your kneecap.  Keeping your fingertips on this muscle. Without actually moving your leg, attempt to drive your knee down, as if straightening your leg. You should feel your VMO tense. If you have a difficult time, you may wish to try the same exercise on your healthy knee first.  Tense this muscle as hard as you can, without increasing any knee pain.  Hold for __________ seconds. Relax the muscles slowly and completely between each repetition. Repeat __________ times. Complete this exercise __________ times per day.    This information is not intended to replace advice given to you by your health care provider. Make sure you discuss any questions you have with your health care provider.   Document Released: 12/02/2005 Document Revised: 12/23/2014 Document Reviewed: 03/16/2009 Elsevier Interactive Patient Education [employer].

## 2015-09-22 NOTE — Progress Notes (Signed)
    Subjective:    Patient ID: Shannon Logan, female    DOB: [birth year], 49 y.o.   MRN: [id number]  HPI: KIAYA Logan is a 49 y.o. female presenting on 09/22/2015 for Annual Exam  Pain in R shoulder after throwing shingles for several hours two days ago. Pain in R hip comes and goes, ibuprofen and tylenol arthritis help. Ongoing for some time. Walks slower than usual. Burning in her toes comes and goes Plantar fasciitis that prevents her form walking more than 1-2 miles Abnormal pap smear many years ago. Last one was normal. LMP several years ago   Relevant past medical, surgical, family and social history reviewed and updated as indicated. Interim medical history since our last visit reviewed. Allergies and medications reviewed and updated.   ROS: Per HPI unless specifically indicated above  Past Medical History Patient Active Problem List   Diagnosis Date Noted  . BMI 34.0-34.9,adult 09/22/2015  . Abdominal pain, right upper quadrant 07/11/2015    Current Outpatient Prescriptions  Medication Sig Dispense Refill  . ibuprofen (ADVIL,MOTRIN) 200 MG tablet Take 200 mg by mouth every 6 (six) hours as needed.    . ranitidine (ZANTAC) 150 MG tablet Take 150 mg by mouth daily.      No current facility-administered medications for this visit.       Objective:    BP 135/80 mmHg  Pulse 69  Temp(Src) 97.5 F (36.4 C) (Oral)  Ht 5\' 5"  (1.651 m)  Wt 207 lb 12.8 oz (94.257 kg)  BMI 34.58 kg/m2  Wt Readings from Last 3 Encounters:  09/22/15 207 lb 12.8 oz (94.257 kg)  09/01/15 212 lb 12.8 oz (96.525 kg)  07/11/15 211 lb (95.709 kg)     Gen: NAD, alert, cooperative with exam, NCAT EYES: EOMI, no scleral injection or icterus ENT:  TMs pearly gray b/l, OP without erythema LYMPH: no cervical LAD CV: NRRR, normal S1/S2, no murmur, DP pulses 2+ b/l Resp: CTABL, no wheezes, normal WOB Abd: +BS, soft, NTND. no guarding or organomegaly Ext: No edema, warm Neuro: Alert and  oriented, CNIII-XII intact. strength equal b/l UE and LE, coodrination grossly normal MSK: normal ROM hips b/l, no pain. No point tenderness over bursitis. Pain with R hip abduction.     Assessment & Plan:   Shannon Logan was seen today for annual exam. Overall doing well, has started eating better, losing weight. Follows with GI for fatty liver.   BMI 34.0-34.9,adult Has lost 9 lbs over past 3 weeks, started low fat diet, toldshe has fatty liver by her GI doctor. Avoiding fried foods Walking some, has a hard time with plantar fascitiis Getting 10,000 steps a day  Screening for cervical cancer -     Pap IG and HPV (high risk) DNA detection  Other orders -     Flu Vaccine QUAD 36+ mos IM  Dizzyness-- lasts for a couple of seconds, sometimes comes on when she turns her head fast. Noticing it more last two weeks. Gave handout for epley maneuver  R IT band weakness: gave exercises, RTC if not improving  Follow up plan: Return in about 6 months (around 03/22/2016) for follow up.  Assunta Found, MD [facility] 09/22/2015, 10:25 AM

## 2015-09-22 NOTE — Assessment & Plan Note (Signed)
Has lost 9 lbs over past 3 weeks, started low fat diet. Avoiding fried foods WAlking some, has a hard time with plantar fascitiis Getting 10,000 steps a day

## 2015-09-22 NOTE — Addendum Note (Signed)
Addended by: [name] on: 09/22/2015 05:11 PM   Modules accepted: Orders, SmartSet

## 2015-09-23 LAB — BMP8+EGFR
BUN/Creatinine Ratio: 20 (ref 9–23)
BUN: 14 mg/dL (ref 6–24)
CALCIUM: 9.5 mg/dL (ref 8.7–10.2)
CO2: 23 mmol/L (ref 18–29)
Chloride: 100 mmol/L (ref 97–108)
Creatinine, Ser: 0.71 mg/dL (ref 0.57–1.00)
GFR calc Af Amer: 116 mL/min/{1.73_m2} (ref >59)
GFR, EST NON AFRICAN AMERICAN: 100 mL/min/{1.73_m2} (ref >59)
Glucose: 106 mg/dL — ABNORMAL HIGH (ref 65–99)
POTASSIUM: 4.4 mmol/L (ref 3.5–5.2)
Sodium: 140 mmol/L (ref 134–144)

## 2015-09-23 LAB — LIPID PANEL
CHOLESTEROL TOTAL: 173 mg/dL (ref 100–199)
Chol/HDL Ratio: 4.6 ratio units — ABNORMAL HIGH (ref 0.0–4.4)
HDL: 38 mg/dL — ABNORMAL LOW (ref >39)
LDL CALC: 96 mg/dL (ref 0–99)
Triglycerides: 194 mg/dL — ABNORMAL HIGH (ref 0–149)
VLDL Cholesterol Cal: 39 mg/dL (ref 5–40)

## 2015-09-25 ENCOUNTER — Telehealth (INDEPENDENT_AMBULATORY_CARE_PROVIDER_SITE_OTHER): Payer: Self-pay | Admitting: *Deleted

## 2015-09-25 NOTE — Telephone Encounter (Signed)
Jeane said she was to call Terri if she had anymore problems to add the EGD to the TCS. The return phone number is [phone]. If Karna Christmas would please give her a call.

## 2015-09-25 NOTE — Telephone Encounter (Signed)
I have spoke with patient

## 2015-09-26 ENCOUNTER — Other Ambulatory Visit (INDEPENDENT_AMBULATORY_CARE_PROVIDER_SITE_OTHER): Payer: Self-pay | Admitting: Internal Medicine

## 2015-09-26 ENCOUNTER — Telehealth (INDEPENDENT_AMBULATORY_CARE_PROVIDER_SITE_OTHER): Payer: Self-pay | Admitting: *Deleted

## 2015-09-26 DIAGNOSIS — R1011 Right upper quadrant pain: Secondary | ICD-10-CM

## 2015-09-26 DIAGNOSIS — Z1211 Encounter for screening for malignant neoplasm of colon: Secondary | ICD-10-CM

## 2015-09-26 MED ORDER — SUPREP BOWEL PREP KIT 17.5-3.13-1.6 GM/177ML PO SOLN: 1.0000 | Freq: Once | ORAL | Status: DC

## 2015-09-26 NOTE — Telephone Encounter (Signed)
Patient needs suprep 

## 2015-09-28 LAB — PAP IG AND HPV HIGH-RISK
HPV, HIGH-RISK: NEGATIVE
PAP Smear Comment: 0

## 2015-10-09 ENCOUNTER — Telehealth (INDEPENDENT_AMBULATORY_CARE_PROVIDER_SITE_OTHER): Payer: Self-pay | Admitting: *Deleted

## 2015-10-09 ENCOUNTER — Encounter (INDEPENDENT_AMBULATORY_CARE_PROVIDER_SITE_OTHER): Payer: Self-pay | Admitting: *Deleted

## 2015-10-09 DIAGNOSIS — Z1211 Encounter for screening for malignant neoplasm of colon: Secondary | ICD-10-CM

## 2015-10-09 NOTE — Telephone Encounter (Signed)
Needs trilyte, suprep not covered

## 2015-10-10 MED ORDER — PEG 3350-KCL-NA BICARB-NACL 420 G PO SOLR: 4000.0000 mL | Freq: Once | ORAL | Status: DC

## 2015-10-13 ENCOUNTER — Encounter: Payer: Self-pay | Admitting: *Deleted

## 2015-10-13 LAB — HEPATIC FUNCTION PANEL
ALT: 20 U/L (ref 6–29)
AST: 18 U/L (ref 10–35)
Albumin: 4.3 g/dL (ref 3.6–5.1)
Alkaline Phosphatase: 111 U/L (ref 33–130)
BILIRUBIN INDIRECT: 0.9 mg/dL (ref 0.2–1.2)
Bilirubin, Direct: 0.1 mg/dL (ref <=0.2)
TOTAL PROTEIN: 6.9 g/dL (ref 6.1–8.1)
Total Bilirubin: 1 mg/dL (ref 0.2–1.2)

## 2015-10-26 ENCOUNTER — Ambulatory Visit (HOSPITAL_COMMUNITY)
Admission: RE | Admit: 2015-10-26 | Discharge: 2015-10-26 | Disposition: A | Discharge status: Home / Self Care | Payer: 59 | Source: Ambulatory Visit | Attending: Internal Medicine | Admitting: Internal Medicine

## 2015-10-26 ENCOUNTER — Encounter (HOSPITAL_COMMUNITY)
Admission: RE | Disposition: A | Discharge status: Home / Self Care | Payer: Self-pay | Source: Ambulatory Visit | Attending: Internal Medicine

## 2015-10-26 ENCOUNTER — Encounter (HOSPITAL_COMMUNITY): Payer: Self-pay | Admitting: *Deleted

## 2015-10-26 DIAGNOSIS — K648 Other hemorrhoids: Secondary | ICD-10-CM | POA: Diagnosis not present

## 2015-10-26 DIAGNOSIS — K6389 Other specified diseases of intestine: Secondary | ICD-10-CM | POA: Diagnosis not present

## 2015-10-26 DIAGNOSIS — K644 Residual hemorrhoidal skin tags: Secondary | ICD-10-CM | POA: Diagnosis not present

## 2015-10-26 DIAGNOSIS — Z87891 Personal history of nicotine dependence: Secondary | ICD-10-CM | POA: Insufficient documentation

## 2015-10-26 DIAGNOSIS — R140 Abdominal distension (gaseous): Secondary | ICD-10-CM | POA: Diagnosis not present

## 2015-10-26 DIAGNOSIS — R1011 Right upper quadrant pain: Secondary | ICD-10-CM | POA: Diagnosis not present

## 2015-10-26 DIAGNOSIS — K449 Diaphragmatic hernia without obstruction or gangrene: Secondary | ICD-10-CM | POA: Diagnosis not present

## 2015-10-26 DIAGNOSIS — Z79899 Other long term (current) drug therapy: Secondary | ICD-10-CM | POA: Diagnosis not present

## 2015-10-26 DIAGNOSIS — Z1211 Encounter for screening for malignant neoplasm of colon: Secondary | ICD-10-CM

## 2015-10-26 DIAGNOSIS — K2210 Ulcer of esophagus without bleeding: Secondary | ICD-10-CM | POA: Diagnosis not present

## 2015-10-26 HISTORY — PX: COLONOSCOPY: SHX5424

## 2015-10-26 HISTORY — PX: ESOPHAGOGASTRODUODENOSCOPY: SHX5428

## 2015-10-26 SURGERY — EGD (ESOPHAGOGASTRODUODENOSCOPY)
Anesthesia: Moderate Sedation

## 2015-10-26 MED ORDER — MEPERIDINE HCL 50 MG/ML IJ SOLN
INTRAMUSCULAR | Status: AC
  Filled 2015-10-26: qty 1

## 2015-10-26 MED ORDER — MIDAZOLAM HCL 5 MG/5ML IJ SOLN
INTRAMUSCULAR | Status: AC
  Filled 2015-10-26: qty 10

## 2015-10-26 MED ORDER — SODIUM CHLORIDE 0.9 % IV SOLN
INTRAVENOUS | Status: DC
  Administered 2015-10-26: 12:00:00 via INTRAVENOUS

## 2015-10-26 MED ORDER — PANTOPRAZOLE SODIUM 40 MG PO TBEC: 40.0000 mg | DELAYED_RELEASE_TABLET | Freq: Every day | ORAL | Status: DC

## 2015-10-26 MED ORDER — MEPERIDINE HCL 50 MG/ML IJ SOLN
INTRAMUSCULAR | Status: DC | PRN
  Administered 2015-10-26 (×2): 25 mg

## 2015-10-26 MED ORDER — MIDAZOLAM HCL 5 MG/5ML IJ SOLN
INTRAMUSCULAR | Status: DC | PRN
Start: 2015-10-26 — End: 2015-10-26
  Administered 2015-10-26 (×2): 2 mg via INTRAVENOUS
  Administered 2015-10-26 (×2): 1 mg via INTRAVENOUS
  Administered 2015-10-26 (×2): 2 mg via INTRAVENOUS

## 2015-10-26 MED ORDER — BUTAMBEN-TETRACAINE-BENZOCAINE 2-2-14 % EX AERO
INHALATION_SPRAY | CUTANEOUS | Status: DC | PRN
  Administered 2015-10-26: 2 via TOPICAL

## 2015-10-26 NOTE — Op Note (Signed)
EGD PROCEDURE REPORT  PATIENT:  Shannon Logan  MR#:  [id number] Birthdate:  11/08/1966, 49 y.o., female Endoscopist:  Dr. Rogene Houston, MD Referred By:  Dr. [name], MD Procedure Date: 10/26/2015  Procedure:   EGD & Colonoscopy  Indications: Patient is 49 year old Caucasian female who presents with chronic right upper quadrant postprandial pain along with bloating. Ultrasound negative for cholelithiasis and HIDA scan revealed normal EF. She also has intermittent heartburn controlled with when necessary Zantac. She is undergoing diagnostic EGD followed by average risk screening colonoscopy.             Informed Consent:  The risks, benefits, alternatives & imponderables which include, but are not limited to, bleeding, infection, perforation, drug reaction and potential missed lesion have been reviewed.  The potential for biopsy, lesion removal, esophageal dilation, etc. have also been discussed.  Questions have been answered.  All parties agreeable.  Please see history & physical in medical record for more information.  Medications:  Demerol 50 mg IV Versed 100 mg IV Cetacaine spray topically for oropharyngeal anesthesia  EGD  Description of procedure:  The endoscope was introduced through the mouth and advanced to the second portion of the duodenum without difficulty or limitations. The mucosal surfaces were surveyed very carefully during advancement of the scope and upon withdrawal.  Findings:  Esophagus: Mucosa of the proximal and middle third was normal. Distally 3 erosions noted. One erosion is at GE junction and the other 2 erosions involving distal esophagus extending to GE junction. GEJ:  37 cm Hiatus:  39 cm Stomach:  Stomach was empty and distended very well with insufflation. Folds in the proximal stomach were normal. Examination mucosa at body, antrum, pyloric channel, angularis fundus and cardia was normal. Duodenum:  Normal bulbar and post bulbar mucosa  appeared  Therapeutic/Diagnostic Maneuvers Performed:  None  COLONOSCOPY Description of procedure:  After a digital rectal exam was performed, that colonoscope was advanced from the anus through the rectum and colon to the area of the cecum, ileocecal valve and appendiceal orifice. The cecum was deeply intubated. These structures were well-seen and photographed for the record. From the level of the cecum and ileocecal valve, the scope was slowly and cautiously withdrawn. The mucosal surfaces were carefully surveyed utilizing scope tip to flexion to facilitate fold flattening as needed. The scope was pulled down into the rectum where a thorough exam including retroflexion was performed.  Findings:   Prep excellent. Normal mucosa of cecum, ascending colon, hepatic flexure, transverse colon, splenic flexure, descending and sigmoid colon. Normal rectal mucosa. Hemorrhoids noted below the dentate line along with two papillae.  Therapeutic/Diagnostic Maneuvers Performed: None  Complications:  None   Cecal Withdrawal Time:  9 minutes  EBL; None  Impression:  ED findings: Erosive reflux esophagitis. Small sliding hiatal hernia. No evidence of gastritis or peptic ulcer disease.  Colonoscopy findings: Normal colonoscopy except external hemorrhoids and anal papillae.  Recommendations:  Anti-reflux measures reinforced. Discontinue Zantac. Pantoprazole 40 mg by mouth 30 minutes before breakfast daily. Office visit in 4 months  Shannon Logan  10/26/2015 1:13 PM  CC: Dr. [name], MD & Dr. Rayne Du ref. provider found

## 2015-10-26 NOTE — Discharge Instructions (Signed)
Discontinue ranitidine or Zantac. Resume other medications as before. Pantoprazole 40 mg by mouth 30 minutes before breakfast daily. Resume usual diet. Anti-reflux measures. No driving for 24 hours. Office visit in 4 months.         Colonoscopy, Care After These instructions give you information on caring for yourself after your procedure. Your doctor may also give you more specific instructions. Call your doctor if you have any problems or questions after your procedure. HOME CARE  Do not drive for 24 hours.  Do not sign important papers or use machinery for 24 hours.  You may shower.  You may go back to your usual activities, but go slower for the first 24 hours.  Take rest breaks often during the first 24 hours.  Walk around or use warm packs on your belly (abdomen) if you have belly cramping or gas.  Drink enough fluids to keep your pee (urine) clear or pale yellow.  Resume your normal diet. Avoid heavy or fried foods.  Avoid drinking alcohol for 24 hours or as told by your doctor.  Only take medicines as told by your doctor. If a tissue sample (biopsy) was taken during the procedure:   Do not take aspirin or blood thinners for 7 days, or as told by your doctor.  Do not drink alcohol for 7 days, or as told by your doctor.  Eat soft foods for the first 24 hours. GET HELP IF: You still have a small amount of blood in your poop (stool) 2-3 days after the procedure. GET HELP RIGHT AWAY IF:  You have more than a small amount of blood in your poop.  You see clumps of tissue (blood clots) in your poop.  Your belly is puffy (swollen).  You feel sick to your stomach (nauseous) or throw up (vomit).  You have a fever.  You have belly pain that gets worse and medicine does not help. MAKE SURE YOU:  Understand these instructions.  Will watch your condition.  Will get help right away if you are not doing well or get worse.   This information is not intended  to replace advice given to you by your health care provider. Make sure you discuss any questions you have with your health care provider.   Document Released: 01/04/2011 Document Revised: 12/07/2013 Document Reviewed: 08/09/2013 Elsevier Interactive Patient Education 2016 [place].   Esophagogastroduodenoscopy, Care After Refer to this sheet in the next few weeks. These instructions provide you with information about caring for yourself after your procedure. Your health care provider may also give you more specific instructions. Your treatment has been planned according to current medical practices, but problems sometimes occur. Call your health care provider if you have any problems or questions after your procedure. WHAT TO EXPECT AFTER THE PROCEDURE After your procedure, it is typical to feel:  Soreness in your throat.  Pain with swallowing.  Sick to your stomach (nauseous).  Bloated.  Dizzy.  Fatigued. HOME CARE INSTRUCTIONS  Do not eat or drink anything until the numbing medicine (local anesthetic) has worn off and your gag reflex has returned. You will know that the local anesthetic has worn off when you can swallow comfortably.  Do not drive or operate machinery until directed by your health care provider.  Take medicines only as directed by your health care provider. SEEK MEDICAL CARE IF:   You cannot stop coughing.  You are not urinating at all or less than usual. SEEK IMMEDIATE MEDICAL CARE IF:  You have difficulty swallowing.  You cannot eat or drink.  You have worsening throat or chest pain.  You have dizziness or lightheadedness or you faint.  You have nausea or vomiting.  You have chills.  You have a fever.  You have severe abdominal pain.  You have black, tarry, or bloody stools.   This information is not intended to replace advice given to you by your health care provider. Make sure you discuss any questions you have with your health care  provider.   Document Released: 11/18/2012 Document Revised: 12/23/2014 Document Reviewed: 11/18/2012 Elsevier Interactive Patient Education 2016 [place].     Gastroesophageal Reflux Disease, Adult Normally, food travels down the esophagus and stays in the stomach to be digested. However, when a person has gastroesophageal reflux disease (GERD), food and stomach acid move back up into the esophagus. When this happens, the esophagus becomes sore and inflamed. Over time, GERD can create small holes (ulcers) in the lining of the esophagus.  CAUSES This condition is caused by a problem with the muscle between the esophagus and the stomach (lower esophageal sphincter, or LES). Normally, the LES muscle closes after food passes through the esophagus to the stomach. When the LES is weakened or abnormal, it does not close properly, and that allows food and stomach acid to go back up into the esophagus. The LES can be weakened by certain dietary substances, medicines, and medical conditions, including:  Tobacco use.  Pregnancy.  Having a hiatal hernia.  Heavy alcohol use.  Certain foods and beverages, such as coffee, chocolate, onions, and peppermint. RISK FACTORS This condition is more likely to develop in:  People who have an increased body weight.  People who have connective tissue disorders.  People who use NSAID medicines. SYMPTOMS Symptoms of this condition include:  Heartburn.  Difficult or painful swallowing.  The feeling of having a lump in the throat.  Abitter taste in the mouth.  Bad breath.  Having a large amount of saliva.  Having an upset or bloated stomach.  Belching.  Chest pain.  Shortness of breath or wheezing.  Ongoing (chronic) cough or a night-time cough.  Wearing away of tooth enamel.  Weight loss. Different conditions can cause chest pain. Make sure to see your health care provider if you experience chest pain. DIAGNOSIS Your health care  provider will take a medical history and perform a physical exam. To determine if you have mild or severe GERD, your health care provider may also monitor how you respond to treatment. You may also have other tests, including:  An endoscopy toexamine your stomach and esophagus with a small camera.  A test thatmeasures the acidity level in your esophagus.  A test thatmeasures how much pressure is on your esophagus.  A barium swallow or modified barium swallow to show the shape, size, and functioning of your esophagus. TREATMENT The goal of treatment is to help relieve your symptoms and to prevent complications. Treatment for this condition may vary depending on how severe your symptoms are. Your health care provider may recommend:  Changes to your diet.  Medicine.  Surgery. HOME CARE INSTRUCTIONS Diet  Follow a diet as recommended by your health care provider. This may involve avoiding foods and drinks such as:  Coffee and tea (with or without caffeine).  Drinks that containalcohol.  Energy drinks and sports drinks.  Carbonated drinks or sodas.  Chocolate and cocoa.  Peppermint and mint flavorings.  Garlic and onions.  Horseradish.  Spicy and  acidic foods, including peppers, chili powder, curry powder, vinegar, hot sauces, and barbecue sauce.  Citrus fruit juices and citrus fruits, such as oranges, lemons, and limes.  Tomato-based foods, such as red sauce, chili, salsa, and pizza with red sauce.  Fried and fatty foods, such as donuts, french fries, potato chips, and high-fat dressings.  High-fat meats, such as hot dogs and fatty cuts of red and white meats, such as rib eye steak, sausage, ham, and bacon.  High-fat dairy items, such as whole milk, butter, and cream cheese.  Eat small, frequent meals instead of large meals.  Avoid drinking large amounts of liquid with your meals.  Avoid eating meals during the 2-3 hours before bedtime.  Avoid lying down right  after you eat.  Do not exercise right after you eat. General Instructions  Pay attention to any changes in your symptoms.  Take over-the-counter and prescription medicines only as told by your health care provider. Do not take aspirin, ibuprofen, or other NSAIDs unless your health care provider told you to do so.  Do not use any tobacco products, including cigarettes, chewing tobacco, and e-cigarettes. If you need help quitting, ask your health care provider.  Wear loose-fitting clothing. Do not wear anything tight around your waist that causes pressure on your abdomen.  Raise (elevate) the head of your bed 6 inches (15cm).  Try to reduce your stress, such as with yoga or meditation. If you need help reducing stress, ask your health care provider.  If you are overweight, reduce your weight to an amount that is healthy for you. Ask your health care provider for guidance about a safe weight loss goal.  Keep all follow-up visits as told by your health care provider. This is important. SEEK MEDICAL CARE IF:  You have new symptoms.  You have unexplained weight loss.  You have difficulty swallowing, or it hurts to swallow.  You have wheezing or a persistent cough.  Your symptoms do not improve with treatment.  You have a hoarse voice. SEEK IMMEDIATE MEDICAL CARE IF:  You have pain in your arms, neck, jaw, teeth, or back.  You feel sweaty, dizzy, or light-headed.  You have chest pain or shortness of breath.  You vomit and your vomit looks like blood or coffee grounds.  You faint.  Your stool is bloody or black.  You cannot swallow, drink, or eat.   This information is not intended to replace advice given to you by your health care provider. Make sure you discuss any questions you have with your health care provider.   Document Released: 09/11/2005 Document Revised: 08/23/2015 Document Reviewed: 03/29/2015 Elsevier Interactive Patient Education [employer].

## 2015-10-26 NOTE — H&P (Signed)
Shannon Logan is an 49 y.o. female.   Chief Complaint: Patient is here for EGD and colonoscopy. HPI: Patient is 49 year old Caucasian female who presents with several month history of right upper quadrant abdominal pain and postprandial bloating. Workup for liver retractor disease has been negative. She had ultrasound negative for cholelithiasis and HIDA scan with CCK revealing EF of over 90% without symptom reproduction. She is feeling better since she is made dietary changes and eating less fatty foods. She denies nausea vomiting melena or rectal bleeding. She is undergoing diagnostic EGD and screening colonoscopy. Family history is negative for CRC.  History reviewed. No pertinent past medical history.  Past Surgical History  Procedure Laterality Date  . Urethral dilation    . Dilation and curettage of uterus      Family History  Problem Relation Age of Onset  . Hypertension Mother   . Hyperlipidemia Mother   . Heart disease Father   . Hypertension Father   . Diabetes Father    Social History:  reports that she has quit smoking. She has never used smokeless tobacco. She reports that she drinks alcohol. She reports that she does not use illicit drugs.  Allergies:  Allergies  Allergen Reactions  . Asa [Aspirin]   . Prednisone   . Sulfa Antibiotics     Medications Prior to Admission  Medication Sig Dispense Refill  . ibuprofen (ADVIL,MOTRIN) 200 MG tablet Take 200 mg by mouth every 6 (six) hours as needed.    . polyethylene glycol-electrolytes (TRILYTE) 420 G solution Take 4,000 mLs by mouth once. 4000 mL 0  . ranitidine (ZANTAC) 150 MG tablet Take 150 mg by mouth daily.     [place] Gunning BOWEL PREP SOLN Take 1 kit by mouth once. 1 Bottle 0    No results found for this or any previous visit (from the past 48 hour(s)). No results found.  ROS  Blood pressure 128/78, temperature 98.2 F (36.8 C), temperature source Oral, resp. rate 20, height 5' 4.5" (1.638 m), weight 198 lb  (89.812 kg), SpO2 99 %. Physical Exam  Constitutional: She appears well-developed and well-nourished.  HENT:  Mouth/Throat: Oropharynx is clear and moist.  Eyes: Conjunctivae are normal. No scleral icterus.  Neck: No thyromegaly present.  Cardiovascular: Normal rate, regular rhythm and normal heart sounds.   No murmur heard. Respiratory: Effort normal and breath sounds normal.  GI:  Abdomen is symmetrical and soft with mild tenderness below the right costal margin and midepigastrium to the right midline. No organomegaly or masses.  Musculoskeletal: She exhibits no edema.  Lymphadenopathy:    She has no cervical adenopathy.  Neurological: She is alert.  Skin: Skin is warm and dry.     Assessment/Plan Right upper quadrant abdominal pain and postprandial bloating. Ultrasound and HIDA scan he CCK unremarkable. Diagnostic EGD and average risk screening colonoscopy.  Saturnino Liew U 10/26/2015, 12:27 PM

## 2015-10-31 ENCOUNTER — Encounter (HOSPITAL_COMMUNITY): Payer: Self-pay | Admitting: Internal Medicine

## 2015-11-02 ENCOUNTER — Encounter (INDEPENDENT_AMBULATORY_CARE_PROVIDER_SITE_OTHER): Payer: Self-pay | Admitting: *Deleted

## 2016-01-26 ENCOUNTER — Encounter: Payer: Self-pay | Admitting: Family Medicine

## 2016-01-26 ENCOUNTER — Ambulatory Visit (INDEPENDENT_AMBULATORY_CARE_PROVIDER_SITE_OTHER): Payer: BLUE CROSS/BLUE SHIELD | Admitting: Family Medicine

## 2016-01-26 VITALS — BP 121/74 | HR 86 | Temp 97.1°F | Ht 64.5 in | Wt 208.0 lb

## 2016-01-26 DIAGNOSIS — J0111 Acute recurrent frontal sinusitis: Secondary | ICD-10-CM

## 2016-01-26 MED ORDER — FLUCONAZOLE 150 MG PO TABS: 150.0000 mg | ORAL_TABLET | Freq: Once | ORAL | Status: DC

## 2016-01-26 MED ORDER — AMOXICILLIN-POT CLAVULANATE 875-125 MG PO TABS: 1.0000 | ORAL_TABLET | Freq: Two times a day (BID) | ORAL | Status: DC

## 2016-01-26 MED ORDER — FLUCONAZOLE 150 MG PO TABS
150.0000 mg | ORAL_TABLET | Freq: Once | ORAL | Status: DC
Start: 2016-01-26 — End: 2016-01-26

## 2016-01-26 NOTE — Patient Instructions (Addendum)
Great to meet you!  Be sure to finish all of your antibiotics, this helps reduce resistance Eat yogurt once daily while taking it.    Use fluconazole if you develop a yeast infection  Sinusitis, Adult Sinusitis is redness, soreness, and puffiness (inflammation) of the air pockets in the bones of your face (sinuses). The redness, soreness, and puffiness can cause air and mucus to get trapped in your sinuses. This can allow germs to grow and cause an infection.  HOME CARE   Drink enough fluids to keep your pee (urine) clear or pale yellow.  Use a humidifier in your home.  Run a hot shower to create steam in the bathroom. Sit in the bathroom with the door closed. Breathe in the steam 3-4 times a day.  Put a warm, moist washcloth on your face 3-4 times a day, or as told by your doctor.  Use salt water sprays (saline sprays) to wet the thick fluid in your nose. This can help the sinuses drain.  Only take medicine as told by your doctor. GET HELP RIGHT AWAY IF:   Your pain gets worse.  You have very bad headaches.  You are sick to your stomach (nauseous).  You throw up (vomit).  You are very sleepy (drowsy) all the time.  Your face is puffy (swollen).  Your vision changes.  You have a stiff neck.  You have trouble breathing. MAKE SURE YOU:   Understand these instructions.  Will watch your condition.  Will get help right away if you are not doing well or get worse.   This information is not intended to replace advice given to you by your health care provider. Make sure you discuss any questions you have with your health care provider.   Document Released: 05/20/2008 Document Revised: 12/23/2014 Document Reviewed: 07/07/2012 Elsevier Interactive Patient Education [employer].

## 2016-01-26 NOTE — Progress Notes (Signed)
   HPI  Patient presents today here for acute visit.  Patient's lines overlie 6 weeks she's had off and on sinus pressure, cough, and fatigue.  She explains that often on she would get better and then repeat symptoms again in 3-4 days. She last improved about 6 days ago and had onset of symptoms again about 4 days ago. She describes bilateral sinus tenderness and pressure. Her cough has improved since the very beginning of the illness. She denies dyspnea, chest pain. She has persistent cough productive of thick sputum.  PMH: Smoking status noted ROS: Per HPI  Objective: BP 121/74 mmHg  Pulse 86  Temp(Src) 97.1 F (36.2 C) (Oral)  Ht 5' 4.5" (1.638 m)  Wt 208 lb (94.348 kg)  BMI 35.16 kg/m2 Gen: NAD, alert, cooperative with exam HEENT: NCAT, right-sided frontal sinus tenderness to palpation, TMs normal bilaterally, nares clear, oropharynx clear CV: RRR, good S1/S2, no murmur Resp: Nonlabored, soft crackles in the left base Ext: No edema, warm Neuro: Alert and oriented, No gross deficits  Assessment and plan:  # Acute sinusitis With crackles in her left base that are persistent after several deep inspirations I'll go ahead and cover for pneumonia as well. Augmentin Discussed daily over, given Diflucan in case she develops signs of yeast infection Return to clinic if worsening or does not improve as expected     Meds ordered this encounter  Medications  . amoxicillin-clavulanate (AUGMENTIN) 875-125 MG tablet    Sig: Take 1 tablet by mouth 2 (two) times daily.    Dispense:  20 tablet    Refill:  0  . DISCONTD: fluconazole (DIFLUCAN) 150 MG tablet    Sig: Take 1 tablet (150 mg total) by mouth once. Repeat in 3 days    Dispense:  1 tablet    Refill:  0  . fluconazole (DIFLUCAN) 150 MG tablet    Sig: Take 1 tablet (150 mg total) by mouth once. Repeat in 3 days    Dispense:  2 tablet    Refill:  [place], MD [facility] 01/26/2016,  1:17 PM

## 2016-03-01 ENCOUNTER — Encounter (INDEPENDENT_AMBULATORY_CARE_PROVIDER_SITE_OTHER): Payer: Self-pay | Admitting: Internal Medicine

## 2016-03-01 ENCOUNTER — Ambulatory Visit (INDEPENDENT_AMBULATORY_CARE_PROVIDER_SITE_OTHER): Payer: BLUE CROSS/BLUE SHIELD | Admitting: Internal Medicine

## 2016-03-01 VITALS — BP 130/76 | HR 72 | Temp 98.6°F | Ht 65.0 in | Wt 211.0 lb

## 2016-03-01 DIAGNOSIS — K2100 Gastro-esophageal reflux disease with esophagitis, without bleeding: Secondary | ICD-10-CM

## 2016-03-01 DIAGNOSIS — R748 Abnormal levels of other serum enzymes: Secondary | ICD-10-CM | POA: Diagnosis not present

## 2016-03-01 MED ORDER — PANTOPRAZOLE SODIUM 40 MG PO TBEC: 40.0000 mg | DELAYED_RELEASE_TABLET | Freq: Every day | ORAL | Status: DC

## 2016-03-01 NOTE — Patient Instructions (Signed)
OV in 1 year.  

## 2016-03-01 NOTE — Progress Notes (Signed)
Subjective:    Patient ID: Shannon Logan, female    DOB: 1966-03-21, 50 y.o.   MRN: [id number]  HPI Here today for f/u. She was last seen in January. Referred by Dr. Alain Honey for rt upper quadrant pain. She had had symptoms for years.  HIDA scan and US abdomen normal.  She underwent an EGD/screening colonoscopy in November of 2016.  EGD revealed erosive reflux esophagitis. No evidence of gastritis or PUD. Small sliding hernia.  She tells me she is doing good.  She is taking her Protonix every day. Her rt upper quadrant pain has resolved. She avoid fatty foods. Her appetite is good. No weight.    Her father died with ischemic bowel.  One tattoo on rt foot and a belly ring.    11/10/2-16: Procedure: EGD & Colonoscopy  Indications: Patient is 50 year old Caucasian female who presents with chronic right upper quadrant postprandial pain along with bloating. Ultrasound negative for cholelithiasis and HIDA scan revealed normal EF. She also has intermittent heartburn controlled with when necessary Zantac. She is undergoing diagnostic EGD followed by average risk screening colonoscopy.   Impression:  ED findings: Erosive reflux esophagitis. Small sliding hiatal hernia. No evidence of gastritis or peptic ulcer disease.  Colonoscopy findings: Normal colonoscopy except external hemorrhoids and anal papillae.   09/08/2015 HIDA scan:  IMPRESSION: No cystic duct obstruction. No CBD obstruction. Post CCK gallbladder ejection fraction 96%.   07/28/2015 [place] RUQ: Rt upper quadrant pain.  IMPRESSION: Liver demonstrates increase echogenicity with coarse echotexture consistent with fatty infiltration and/or hepatocellular disease. Exam otherwise negative. No gallstones noted.   Review of Systems Past Medical History  Diagnosis Date  . GERD (gastroesophageal reflux disease)     Past Surgical History  Procedure Laterality Date  . Urethral dilation    . Dilation and curettage of  uterus    . Esophagogastroduodenoscopy N/A 10/26/2015    Procedure: ESOPHAGOGASTRODUODENOSCOPY (EGD);  Surgeon: Rogene Houston, MD;  Location: [facility];  Service: Endoscopy;  Laterality: N/A;  1:00  . Colonoscopy N/A 10/26/2015    Procedure: COLONOSCOPY;  Surgeon: Rogene Houston, MD;  Location: [facility];  Service: Endoscopy;  Laterality: N/A;    Allergies  Allergen Reactions  . Asa [Aspirin]   . Prednisone   . Sulfa Antibiotics     Current Outpatient Prescriptions on File Prior to Visit  Medication Sig Dispense Refill  . ibuprofen (ADVIL,MOTRIN) 200 MG tablet Take 200 mg by mouth every 6 (six) hours as needed.     No current facility-administered medications on file prior to visit.   Current Outpatient Prescriptions  Medication Sig Dispense Refill  . acetaminophen (TYLENOL) 500 MG tablet Take 500 mg by mouth every 6 (six) hours as needed.    Marland Kitchen ibuprofen (ADVIL,MOTRIN) 200 MG tablet Take 200 mg by mouth every 6 (six) hours as needed.    . pantoprazole (PROTONIX) 40 MG tablet Take 1 tablet (40 mg total) by mouth daily before breakfast. 90 tablet 6   No current facility-administered medications for this visit.         Objective:   Physical Exam Blood pressure 130/76, pulse 72, temperature 98.6 F (37 C), height 5\' 5"  (1.651 m), weight 211 lb (95.709 kg).  Alert and oriented. Skin warm and dry. Oral mucosa is moist.   . Sclera anicteric, conjunctivae is pink. Thyroid not enlarged. No cervical lymphadenopathy. Lungs clear. Heart regular rate and rhythm.  Abdomen is soft. Bowel sounds are positive. No hepatomegaly.  No abdominal masses felt. No tenderness.  No edema to lower extremities.        Assessment & Plan:  Erosive esophagitis. She is doing much better since starting the Protonix. She is following a GERD diet for the most part. Elevated liver enzymes: Last enzymes by me were normal. Will continue to monitor.

## 2016-03-14 ENCOUNTER — Encounter (INDEPENDENT_AMBULATORY_CARE_PROVIDER_SITE_OTHER): Payer: Self-pay | Admitting: Internal Medicine

## 2016-03-14 ENCOUNTER — Ambulatory Visit: Payer: 59 | Admitting: Pediatrics

## 2016-03-15 ENCOUNTER — Ambulatory Visit: Payer: 59 | Admitting: Pediatrics

## 2016-03-21 ENCOUNTER — Ambulatory Visit (INDEPENDENT_AMBULATORY_CARE_PROVIDER_SITE_OTHER): Payer: BLUE CROSS/BLUE SHIELD | Admitting: Internal Medicine

## 2016-03-22 ENCOUNTER — Ambulatory Visit: Payer: 59 | Admitting: Pediatrics

## 2016-04-05 ENCOUNTER — Encounter: Payer: Self-pay | Admitting: Family Medicine

## 2016-04-05 ENCOUNTER — Ambulatory Visit (INDEPENDENT_AMBULATORY_CARE_PROVIDER_SITE_OTHER): Payer: BLUE CROSS/BLUE SHIELD | Admitting: Family Medicine

## 2016-04-05 VITALS — BP 118/71 | HR 73 | Temp 98.4°F | Ht 65.0 in | Wt 206.0 lb

## 2016-04-05 DIAGNOSIS — E781 Pure hyperglyceridemia: Secondary | ICD-10-CM

## 2016-04-05 DIAGNOSIS — Z6834 Body mass index (BMI) 34.0-34.9, adult: Secondary | ICD-10-CM | POA: Diagnosis not present

## 2016-04-05 DIAGNOSIS — Z1231 Encounter for screening mammogram for malignant neoplasm of breast: Secondary | ICD-10-CM

## 2016-04-05 DIAGNOSIS — R7301 Impaired fasting glucose: Secondary | ICD-10-CM | POA: Diagnosis not present

## 2016-04-05 LAB — BAYER DCA HB A1C WAIVED: HB A1C: 5.9 % (ref <7.0)

## 2016-04-05 NOTE — Progress Notes (Signed)
BP 118/71 mmHg  Pulse 73  Temp(Src) 98.4 F (36.9 C) (Oral)  Ht '5\' 5"'  (1.651 m)  Wt 206 lb (93.441 kg)  BMI 34.28 kg/m2   Subjective:    Patient ID: Shannon Logan, female    DOB: 12/19/1965, 50 y.o.   MRN: [id number]  HPI: Shannon Logan is a 50 y.o. female presenting on 04/05/2016 for No chief complaint on file.   HPI Recheck on cholesterol and elevated glucose On her past labs 6 months ago she had a slightly elevated fasting glucose and elevated triglyceride levels. She was recommended diet and exercise and she has been trying to change her diet has started bleeding. She has lost 6 pounds over the past 6 months per her and is trying to continue that. During her office visit in October with [place] though her weight was 207 and today it is 206.  Relevant past medical, surgical, family and social history reviewed and updated as indicated. Interim medical history since our last visit reviewed. Allergies and medications reviewed and updated.  Review of Systems  Constitutional: Negative for fever and chills.  HENT: Negative for congestion, ear discharge and ear pain.   Eyes: Negative for redness and visual disturbance.  Respiratory: Negative for chest tightness and shortness of breath.   Cardiovascular: Negative for chest pain and leg swelling.  Genitourinary: Negative for dysuria and difficulty urinating.  Musculoskeletal: Negative for back pain and gait problem.  Skin: Negative for rash.  Neurological: Negative for light-headedness and headaches.  Psychiatric/Behavioral: Negative for behavioral problems and agitation.  All other systems reviewed and are negative.   Per HPI unless specifically indicated above     Medication List       This list is accurate as of: 04/05/16  8:50 AM.  Always use your most recent med list.               acetaminophen 500 MG tablet  Commonly known as:  TYLENOL  Take 500 mg by mouth every 6 (six) hours as needed.     ibuprofen 200 MG  tablet  Commonly known as:  ADVIL,MOTRIN  Take 200 mg by mouth every 6 (six) hours as needed.     pantoprazole 40 MG tablet  Commonly known as:  PROTONIX  Take 1 tablet (40 mg total) by mouth daily before breakfast.           Objective:    BP 118/71 mmHg  Pulse 73  Temp(Src) 98.4 F (36.9 C) (Oral)  Ht '5\' 5"'  (1.651 m)  Wt 206 lb (93.441 kg)  BMI 34.28 kg/m2  Wt Readings from Last 3 Encounters:  04/05/16 206 lb (93.441 kg)  03/01/16 211 lb (95.709 kg)  01/26/16 208 lb (94.348 kg)    Physical Exam  Constitutional: She is oriented to person, place, and time. She appears well-developed and well-nourished. No distress.  Eyes: Conjunctivae and EOM are normal. Pupils are equal, round, and reactive to light.  Neck: Neck supple. No thyromegaly present.  Cardiovascular: Normal rate, regular rhythm, normal heart sounds and intact distal pulses.   No murmur heard. Pulmonary/Chest: Effort normal and breath sounds normal. No respiratory distress. She has no wheezes.  Musculoskeletal: Normal range of motion. She exhibits no edema or tenderness.  Lymphadenopathy:    She has no cervical adenopathy.  Neurological: She is alert and oriented to person, place, and time. Coordination normal.  Skin: Skin is warm and dry. No rash noted. She is not diaphoretic.  Psychiatric: She  has a normal mood and affect. Her behavior is normal.  Nursing note and vitals reviewed.   Results for orders placed or performed in visit on 09/22/15  Lipid panel  Result Value Ref Range   Cholesterol, Total 173 100 - 199 mg/dL   Triglycerides 194 (H) 0 - 149 mg/dL   HDL 38 (L) >39 mg/dL   VLDL Cholesterol Cal 39 5 - 40 mg/dL   LDL Calculated 96 0 - 99 mg/dL   Chol/HDL Ratio 4.6 (H) 0.0 - 4.4 ratio units  BMP8+EGFR  Result Value Ref Range   Glucose 106 (H) 65 - 99 mg/dL   BUN 14 6 - 24 mg/dL   Creatinine, Ser 0.71 0.57 - 1.00 mg/dL   GFR calc non Af Amer 100 >59 mL/min/1.73   GFR calc Af Amer 116 >59  mL/min/1.73   BUN/Creatinine Ratio 20 9 - 23   Sodium 140 134 - 144 mmol/L   Potassium 4.4 3.5 - 5.2 mmol/L   Chloride 100 97 - 108 mmol/L   CO2 23 18 - 29 mmol/L   Calcium 9.5 8.7 - 10.2 mg/dL  Pap IG and HPV (high risk) DNA detection  Result Value Ref Range   DIAGNOSIS: Comment    Specimen adequacy: Comment    CLINICIAN PROVIDED ICD10: Comment    Performed by: Comment    QC reviewed by: Comment    PAP SMEAR COMMENT .    Note: Comment    Test Methodology Comment    HPV, high-risk Negative Negative      Assessment & Plan:   Problem List Items Addressed This Visit      Other   BMI 34.0-34.9,adult   Hypertriglyceridemia   Relevant Orders   Lipase    Other Visit Diagnoses    Visit for screening mammogram    -  Primary    Relevant Orders    MM Digital Screening    Elevated fasting glucose        Relevant Orders    BMP8+EGFR    Bayer DCA Hb A1c Waived        Follow up plan: Return in about 6 months (around 10/05/2016), or if symptoms worsen or fail to improve, for Recheck cholesterol and annual exam.  Counseling provided for all of the vaccine components Orders Placed This Encounter  Procedures  . MM Digital Screening  . BMP8+EGFR  . Lipase  . Bayer [facility] Hb A1c [place], MD [facility] 04/05/2016, 8:50 AM

## 2016-04-06 LAB — BMP8+EGFR
BUN / CREAT RATIO: 14 (ref 9–23)
BUN: 12 mg/dL (ref 6–24)
CALCIUM: 9.8 mg/dL (ref 8.7–10.2)
CHLORIDE: 99 mmol/L (ref 96–106)
CO2: 22 mmol/L (ref 18–29)
CREATININE: 0.86 mg/dL (ref 0.57–1.00)
GFR calc Af Amer: 91 mL/min/{1.73_m2} (ref >59)
GFR calc non Af Amer: 79 mL/min/{1.73_m2} (ref >59)
GLUCOSE: 110 mg/dL — AB (ref 65–99)
Potassium: 4.3 mmol/L (ref 3.5–5.2)
Sodium: 140 mmol/L (ref 134–144)

## 2016-04-06 LAB — LIPASE: LIPASE: 23 U/L (ref 0–59)

## 2016-06-18 ENCOUNTER — Encounter: Payer: Self-pay | Admitting: Pediatrics

## 2016-07-15 ENCOUNTER — Encounter: Payer: BLUE CROSS/BLUE SHIELD | Admitting: *Deleted

## 2016-09-27 ENCOUNTER — Ambulatory Visit (INDEPENDENT_AMBULATORY_CARE_PROVIDER_SITE_OTHER): Payer: BLUE CROSS/BLUE SHIELD | Admitting: Pediatrics

## 2016-09-27 ENCOUNTER — Encounter: Payer: Self-pay | Admitting: Pediatrics

## 2016-09-27 VITALS — BP 115/69 | HR 65 | Temp 98.0°F | Ht 65.0 in | Wt 194.0 lb

## 2016-09-27 DIAGNOSIS — Z6832 Body mass index (BMI) 32.0-32.9, adult: Secondary | ICD-10-CM

## 2016-09-27 DIAGNOSIS — Z0000 Encounter for general adult medical examination without abnormal findings: Secondary | ICD-10-CM | POA: Diagnosis not present

## 2016-09-27 DIAGNOSIS — Z23 Encounter for immunization: Secondary | ICD-10-CM | POA: Diagnosis not present

## 2016-09-27 DIAGNOSIS — E781 Pure hyperglyceridemia: Secondary | ICD-10-CM

## 2016-09-27 NOTE — Progress Notes (Signed)
  Subjective:   Patient ID: [name], female    DOB: [birth year], 50 y.o.   MRN: [id number] CC: Annual Exam  HPI: TOIA MICALE is a 50 y.o. female presenting for Annual Exam  Sinus congestion she takes OTC meds for Helps some  In July at three days of being dizzy at the beach Seen in the ED, told to follow up for a stress test with her PCP Had some chest pain at the time, she says normal EKG, CT scan at the time Yesterday had tightening in her chest at work when it was very stressful  BMI elevated: Lost about 25 lbs since she started  One diet pepsi a day Water mostly Walking regularly  Son getting married 1 year, trying to lose weight  Regular bowel movements, no blood in stool  Relevant past medical, surgical, family and social history reviewed. Allergies and medications reviewed and updated. History  Smoking Status  . Former Smoker  Smokeless Tobacco  . Never Used    Comment: quit smoking 07/23/2008 after smoking 30 yrs. (1/2 pack a day)   ROS: All systems negative other than what is in HPI  Objective:    BP 115/69   Pulse 65   Temp 98 F (36.7 C) (Oral)   Ht _0  (1.651 m)   Wt 194 lb (88 kg)   BMI 32.28 kg/m   Wt Readings from Last 3 Encounters:  09/27/16 194 lb (88 kg)  04/05/16 206 lb (93.4 kg)  03/01/16 211 lb (95.7 kg)    Gen: NAD, alert, cooperative with exam, NCAT EYES: EOMI, no conjunctival injection, or no icterus ENT:  TMs pearly gray b/l, OP without erythema LYMPH: no cervical LAD CV: NRRR, normal S1/S2, no murmur, distal pulses 2+ b/l Resp: CTABL, no wheezes, normal WOB Abd: +BS, soft, NTND. no guarding or organomegaly Ext: No edema, warm Neuro: Alert and oriented, strength equal b/l UE and LE, coordination grossly normal MSK: normal muscle bulk  Assessment & Plan:  Antwonette was seen today for annual exam.  Diagnoses and all orders for this visit:  Encounter for preventive health examination Will ocme back for fasting blood  work -     CMP14+EGFR; Future -     Bayer DCA Hb A1c Waived; Future -     Lipid panel; Future -     Tdap vaccine greater than or equal to 7yo IM  Encounter for immunization -     Flu Vaccine QUAD 36+ mos IM  BMI 32.0-32.9,adult Cont lifestyle changes, has already lost significant weight, losing more slowly now she says but feels much better  Hypertriglyceridemia Check fasting labs, not on medication now  Follow up plan: 1 yr Assunta Found, MD [place]

## 2016-10-04 ENCOUNTER — Telehealth: Payer: Self-pay | Admitting: Pediatrics

## 2016-10-10 ENCOUNTER — Other Ambulatory Visit: Payer: Self-pay | Admitting: *Deleted

## 2016-10-10 ENCOUNTER — Encounter: Payer: Self-pay | Admitting: Pediatrics

## 2016-10-10 DIAGNOSIS — E781 Pure hyperglyceridemia: Secondary | ICD-10-CM

## 2016-10-10 DIAGNOSIS — R7303 Prediabetes: Secondary | ICD-10-CM | POA: Insufficient documentation

## 2016-10-22 ENCOUNTER — Telehealth: Payer: Self-pay | Admitting: Pediatrics

## 2016-10-22 NOTE — Telephone Encounter (Signed)
Pt found breast lump but had sharp pain and now lump is gone  pt is also having lip and arm numbness appt scheduled for evaluation Instructed pt that if sxs worsen to call to be seen at earlier time

## 2016-10-25 ENCOUNTER — Ambulatory Visit (INDEPENDENT_AMBULATORY_CARE_PROVIDER_SITE_OTHER): Payer: BLUE CROSS/BLUE SHIELD | Admitting: Pediatrics

## 2016-10-25 ENCOUNTER — Encounter: Payer: Self-pay | Admitting: Pediatrics

## 2016-10-25 VITALS — BP 125/81 | HR 70 | Temp 97.8°F | Ht 65.0 in | Wt 195.0 lb

## 2016-10-25 DIAGNOSIS — N6320 Unspecified lump in the left breast, unspecified quadrant: Secondary | ICD-10-CM | POA: Diagnosis not present

## 2016-10-25 DIAGNOSIS — R202 Paresthesia of skin: Secondary | ICD-10-CM | POA: Diagnosis not present

## 2016-10-25 DIAGNOSIS — K5900 Constipation, unspecified: Secondary | ICD-10-CM | POA: Diagnosis not present

## 2016-10-25 DIAGNOSIS — T148XXA Other injury of unspecified body region, initial encounter: Secondary | ICD-10-CM

## 2016-10-25 LAB — BAYER DCA HB A1C WAIVED: HB A1C: 5.7 % (ref <7.0)

## 2016-10-25 NOTE — Progress Notes (Signed)
  Subjective:   Patient ID: Shannon Logan, female    DOB: 1966/11/03, 50 y.o.   MRN: [id number] CC: Lump right breast  HPI: Shannon Logan is a 50 y.o. female presenting for Lump right breast  Lump about 2-3 years ago after falling hard on L side/breast Hurt for a while, needed muscle relaxers at the time Has had lump in area since then Reaching for something and turning at same time to the R a couple days and felt something popping  Has been taking several new supplements from [facility], vitamin E, a combination supplement with caffeine, probiotics Since starting the supplements she has bene having intermittent slight tingling in fingers, face Probiotics have been helping with constipation  Relevant past medical, surgical, family and social history reviewed. Allergies and medications reviewed and updated. History  Smoking Status  . Former Smoker  Smokeless Tobacco  . Never Used    Comment: quit smoking 07/23/2008 after smoking 30 yrs. (1/2 pack a day)   ROS: Per HPI   Objective:    BP 125/81   Pulse 70   Temp 97.8 F (36.6 C) (Oral)   Ht '5\' 5"'$  (1.651 m)   Wt 195 lb (88.5 kg)   BMI 32.45 kg/m   Wt Readings from Last 3 Encounters:  10/25/16 195 lb (88.5 kg)  09/27/16 194 lb (88 kg)  04/05/16 206 lb (93.4 kg)    Gen: NAD, alert, cooperative with exam, NCAT EYES: EOMI, no conjunctival injection, or no icterus LYMPH: no cervical LAD Breast: L lateral lower quadrant with apprx 1 cm lump CV: NRRR, normal S1/S2, no murmur, distal pulses 2+ b/l Resp: CTABL, no wheezes, normal WOB Abd: +BS, soft, NTND. no guarding or organomegaly Ext: No edema, warm Neuro: Alert and oriented, strength equal b/l UE and LE, sensation normal throughout MSK: TTP along intercostal musclesL side  Assessment & Plan:  Ronda was seen today for lump right breast.  Diagnoses and all orders for this visit:  Left breast lump Also due for screening mammogram -     MM Digital Diagnostic Unilat L;  Future  Paresthesia of both hands Stop supplements Ok to restart the probiotics if symptoms stop Let me know if symptoms ongoing -     CMP14+EGFR -     Bayer DCA Hb A1c Waived -     Lipid panel  Muscle strain L side, Rest, ice, tylenol prn  Constipation, unspecified constipation type Increase water intake, discussed OTC meds, let me know if not improving  Follow up plan: Return in about 3 months (around 01/25/2017). Assunta Found, MD [place]

## 2016-10-26 LAB — CMP14+EGFR
A/G RATIO: 1.7 (ref 1.2–2.2)
ALT: 19 IU/L (ref 0–32)
AST: 16 IU/L (ref 0–40)
Albumin: 4.5 g/dL (ref 3.5–5.5)
Alkaline Phosphatase: 126 IU/L — ABNORMAL HIGH (ref 39–117)
BILIRUBIN TOTAL: 0.9 mg/dL (ref 0.0–1.2)
BUN/Creatinine Ratio: 15 (ref 9–23)
BUN: 13 mg/dL (ref 6–24)
CALCIUM: 9.5 mg/dL (ref 8.7–10.2)
CHLORIDE: 102 mmol/L (ref 96–106)
CO2: 23 mmol/L (ref 18–29)
Creatinine, Ser: 0.89 mg/dL (ref 0.57–1.00)
GFR calc Af Amer: 87 mL/min/{1.73_m2} (ref >59)
GFR, EST NON AFRICAN AMERICAN: 75 mL/min/{1.73_m2} (ref >59)
Globulin, Total: 2.7 g/dL (ref 1.5–4.5)
Glucose: 114 mg/dL — ABNORMAL HIGH (ref 65–99)
POTASSIUM: 5 mmol/L (ref 3.5–5.2)
Sodium: 144 mmol/L (ref 134–144)
Total Protein: 7.2 g/dL (ref 6.0–8.5)

## 2016-10-26 LAB — LIPID PANEL
CHOLESTEROL TOTAL: 209 mg/dL — AB (ref 100–199)
Chol/HDL Ratio: 4.4 ratio units (ref 0.0–4.4)
HDL: 47 mg/dL (ref >39)
LDL Calculated: 136 mg/dL — ABNORMAL HIGH (ref 0–99)
TRIGLYCERIDES: 128 mg/dL (ref 0–149)
VLDL Cholesterol Cal: 26 mg/dL (ref 5–40)

## 2016-10-30 NOTE — Telephone Encounter (Signed)
-----   Message from Worthy Rancher, MD sent at 10/29/2016  5:50 PM EST ----- Regarding: RE: treadmill  Yes okay to schedule for one ----- Message ----- From: Thana Ates, LPN Sent: [id number]  11:33 AM To: Georga Kaufmann, LPN, Theron Arista, RN, # Subject: treadmill                                      Please review for treadmill

## 2016-11-13 ENCOUNTER — Telehealth: Payer: Self-pay

## 2016-11-14 ENCOUNTER — Other Ambulatory Visit: Payer: Self-pay | Admitting: Pediatrics

## 2016-11-14 DIAGNOSIS — N6320 Unspecified lump in the left breast, unspecified quadrant: Secondary | ICD-10-CM

## 2016-11-14 NOTE — Telephone Encounter (Signed)
LMOM as per DOR with appointment date/time of 11/19/16 at 10:40 with an arrival time of 10:20

## 2016-11-15 ENCOUNTER — Encounter: Payer: Self-pay | Admitting: Pediatrics

## 2016-11-15 ENCOUNTER — Ambulatory Visit (INDEPENDENT_AMBULATORY_CARE_PROVIDER_SITE_OTHER): Payer: BLUE CROSS/BLUE SHIELD | Admitting: Pediatrics

## 2016-11-15 VITALS — BP 118/77 | HR 73 | Temp 97.1°F | Resp 20 | Ht 65.0 in | Wt 198.2 lb

## 2016-11-15 DIAGNOSIS — H6690 Otitis media, unspecified, unspecified ear: Secondary | ICD-10-CM

## 2016-11-15 DIAGNOSIS — J209 Acute bronchitis, unspecified: Secondary | ICD-10-CM | POA: Diagnosis not present

## 2016-11-15 DIAGNOSIS — N63 Unspecified lump in unspecified breast: Secondary | ICD-10-CM | POA: Diagnosis not present

## 2016-11-15 MED ORDER — AZITHROMYCIN 250 MG PO TABS: ORAL_TABLET | ORAL | 0 refills | Status: DC

## 2016-11-15 NOTE — Progress Notes (Signed)
  Subjective:   Patient ID: Shannon Logan, female    DOB: 04/24/1966, 50 y.o.   MRN: [id number] CC: Cough and Chest congestion  HPI: Shannon Logan is a 50 y.o. female presenting for Cough and Chest congestion  2 weeks of coughing Staying the same Taking mucinex regularly Some mucus on tonsils Hurts when she coughs Mostly non-productive Did have some ear pain R side Was waking up sweating before Minimal nose congestion Still with pain in throat  Skin lesion on right side of head just inside hairline Has been there for over a year Not changing Not itching, no bleeding  Breast lump has imaging scheduled for next week  Relevant past medical, surgical, family and social history reviewed. Allergies and medications reviewed and updated. History  Smoking Status  . Former Smoker  Smokeless Tobacco  . Never Used    Comment: quit smoking 07/23/2008 after smoking 30 yrs. (1/2 pack a day)   ROS: Per HPI   Objective:    BP 118/77   Pulse 73   Temp 97.1 F (36.2 C) (Oral)   Resp 20   Ht 5\' 5"  (1.651 m)   Wt 198 lb 3.2 oz (89.9 kg)   SpO2 96%   BMI 32.98 kg/m   Wt Readings from Last 3 Encounters:  11/15/16 198 lb 3.2 oz (89.9 kg)  10/25/16 195 lb (88.5 kg)  09/27/16 194 lb (88 kg)    Gen: NAD, alert, cooperative with exam, NCAT, congested EYES: EOMI, no conjunctival injection, or no icterus ENT:  R TM red, L TM dull, injected, OP with mild erythema LYMPH: no cervical LAD CV: NRRR, normal S1/S2, no murmur, distal pulses 2+ b/l Resp: CTABL, no wheezes, normal WOB Ext: No edema, warm Neuro: Alert and oriented Skin: raised slightly rough flat topped papule inside hairline L side of forehead, no friability, no redness or irritation  Assessment & Plan:  Brittiny was seen today for cough and chest congestion.  Diagnoses and all orders for this visit:  Acute bronchitis, unspecified organism ongoin gsymptoms 2 weeks Treat for bronchitis as below, also will cover AOM -      azithromycin (ZITHROMAX) 250 MG tablet; Take 2 the first day and then one each day after.  Acute otitis media, unspecified otitis media type  Breast lump Has imaging scheduled next week  Follow up plan: No Follow-up on file. Assunta Found, MD [place]

## 2016-11-15 NOTE — Telephone Encounter (Signed)
Appointment scheduled.

## 2016-11-19 ENCOUNTER — Ambulatory Visit
Admission: RE | Admit: 2016-11-19 | Discharge: 2016-11-19 | Disposition: A | Discharge status: Home / Self Care | Payer: BLUE CROSS/BLUE SHIELD | Source: Ambulatory Visit | Attending: Pediatrics | Admitting: Pediatrics

## 2016-11-19 ENCOUNTER — Other Ambulatory Visit: Payer: Self-pay | Admitting: Pediatrics

## 2016-11-19 DIAGNOSIS — N6320 Unspecified lump in the left breast, unspecified quadrant: Secondary | ICD-10-CM

## 2016-11-21 ENCOUNTER — Other Ambulatory Visit: Payer: Self-pay | Admitting: Pediatrics

## 2016-11-21 ENCOUNTER — Ambulatory Visit (INDEPENDENT_AMBULATORY_CARE_PROVIDER_SITE_OTHER): Payer: BLUE CROSS/BLUE SHIELD

## 2016-11-21 ENCOUNTER — Ambulatory Visit
Admission: RE | Admit: 2016-11-21 | Discharge: 2016-11-21 | Disposition: A | Discharge status: Home / Self Care | Payer: BLUE CROSS/BLUE SHIELD | Source: Ambulatory Visit | Attending: Pediatrics | Admitting: Pediatrics

## 2016-11-21 DIAGNOSIS — E781 Pure hyperglyceridemia: Secondary | ICD-10-CM

## 2016-11-21 DIAGNOSIS — N6320 Unspecified lump in the left breast, unspecified quadrant: Secondary | ICD-10-CM

## 2016-11-22 DIAGNOSIS — C801 Malignant (primary) neoplasm, unspecified: Secondary | ICD-10-CM

## 2016-11-22 HISTORY — DX: Malignant (primary) neoplasm, unspecified: C80.1

## 2016-11-27 LAB — EXERCISE TOLERANCE TEST
CHL CUP MPHR: 169 {beats}/min
CHL CUP RESTING HR STRESS: 83 {beats}/min
CHL RATE OF PERCEIVED EXERTION: 6
Estimated workload: 7.1 METS
Exercise duration (min): 6 min
Exercise duration (sec): 13 s
Peak HR: 158 {beats}/min
Percent HR: 93 %

## 2016-12-04 ENCOUNTER — Other Ambulatory Visit: Payer: Self-pay | Admitting: Surgery

## 2016-12-04 DIAGNOSIS — Z853 Personal history of malignant neoplasm of breast: Secondary | ICD-10-CM

## 2016-12-10 ENCOUNTER — Encounter: Payer: Self-pay | Admitting: Radiation Oncology

## 2016-12-13 ENCOUNTER — Encounter: Payer: Self-pay | Admitting: Genetic Counselor

## 2016-12-13 ENCOUNTER — Telehealth: Payer: Self-pay | Admitting: Genetic Counselor

## 2016-12-13 NOTE — Telephone Encounter (Signed)
Appt scheduled w/Karen Florene Glen for 02/19/17 at 2pm. Aware to arrive 30 minutes early. Demographics verified. Letter mailed.

## 2016-12-25 ENCOUNTER — Encounter (INDEPENDENT_AMBULATORY_CARE_PROVIDER_SITE_OTHER): Payer: Self-pay | Admitting: Internal Medicine

## 2016-12-26 ENCOUNTER — Telehealth: Payer: Self-pay | Admitting: Pediatrics

## 2016-12-31 NOTE — Progress Notes (Signed)
Location of Breast Cancer: Left Breast  Histology per Pathology Report:  11/21/16 Diagnosis Breast, left, needle core biopsy, 3:30 o'clock, 8 cm fn - INVASIVE DUCTAL CARCINOMA, GRADE 2.  Receptor Status: ER(NEG), PR (NEG), Her2-neu (NEG), Ki-(40%)  Did patient present with symptoms or was this found on screening mammography?: She self palpated the mass.   Past/Anticipated interventions by surgeon, if any: Dr. Ninfa Linden, surgery is not scheduled at this time. Her surgery is currently scheduled for 01/22/17, but she reports to me that she is not scheduled to see genetics until March, and this may delay her surgery.   Past/Anticipated interventions by medical oncology, if any:  She has an appointment with Dr. Whitney Muse 01/09/17  Lymphedema issues, if any:  N/A  Pain issues, if any:  She has pain related to a bladder infection which is relieved with AZO.  SAFETY ISSUES:  Prior radiation? No  Pacemaker/ICD? No  Possible current pregnancy? No  Is the patient on methotrexate? No  Current Complaints / other details:  BP 137/87   Pulse 79   Temp 97.9 F (36.6 C)   Ht _0  (1.651 m)   Wt 205 lb 9.6 oz (93.3 kg)   SpO2 97% Comment: room air  BMI 34.21 kg/m     Wt Readings from Last 3 Encounters:  01/03/17 205 lb 9.6 oz (93.3 kg)  11/15/16 198 lb 3.2 oz (89.9 kg)  10/25/16 195 lb (88.5 kg)       Shannon Logan, Stephani Police, RN 12/31/2016,12:14 PM

## 2017-01-02 ENCOUNTER — Encounter (HOSPITAL_COMMUNITY): Payer: BLUE CROSS/BLUE SHIELD | Admitting: Genetic Counselor

## 2017-01-02 ENCOUNTER — Other Ambulatory Visit (HOSPITAL_COMMUNITY): Payer: BLUE CROSS/BLUE SHIELD

## 2017-01-02 NOTE — Telephone Encounter (Signed)
Labs viewed via [employer]

## 2017-01-03 ENCOUNTER — Ambulatory Visit (INDEPENDENT_AMBULATORY_CARE_PROVIDER_SITE_OTHER): Payer: BLUE CROSS/BLUE SHIELD | Admitting: Pediatrics

## 2017-01-03 ENCOUNTER — Ambulatory Visit (HOSPITAL_BASED_OUTPATIENT_CLINIC_OR_DEPARTMENT_OTHER): Payer: BLUE CROSS/BLUE SHIELD | Admitting: Genetic Counselor

## 2017-01-03 ENCOUNTER — Encounter: Payer: Self-pay | Admitting: Pediatrics

## 2017-01-03 ENCOUNTER — Ambulatory Visit
Admission: RE | Admit: 2017-01-03 | Discharge: 2017-01-03 | Disposition: A | Discharge status: Home / Self Care | Payer: BLUE CROSS/BLUE SHIELD | Source: Ambulatory Visit | Attending: Radiation Oncology | Admitting: Radiation Oncology

## 2017-01-03 ENCOUNTER — Other Ambulatory Visit: Payer: BLUE CROSS/BLUE SHIELD

## 2017-01-03 ENCOUNTER — Encounter: Payer: Self-pay | Admitting: Radiation Oncology

## 2017-01-03 ENCOUNTER — Encounter: Payer: Self-pay | Admitting: Genetic Counselor

## 2017-01-03 VITALS — BP 125/75 | HR 74 | Temp 97.3°F | Ht 65.0 in | Wt 208.6 lb

## 2017-01-03 DIAGNOSIS — N3090 Cystitis, unspecified without hematuria: Secondary | ICD-10-CM

## 2017-01-03 DIAGNOSIS — C50512 Malignant neoplasm of lower-outer quadrant of left female breast: Secondary | ICD-10-CM

## 2017-01-03 DIAGNOSIS — Z170 Estrogen receptor positive status [ER+]: Secondary | ICD-10-CM

## 2017-01-03 DIAGNOSIS — Z803 Family history of malignant neoplasm of breast: Secondary | ICD-10-CM | POA: Insufficient documentation

## 2017-01-03 DIAGNOSIS — R399 Unspecified symptoms and signs involving the genitourinary system: Secondary | ICD-10-CM | POA: Diagnosis not present

## 2017-01-03 DIAGNOSIS — Z87891 Personal history of nicotine dependence: Secondary | ICD-10-CM | POA: Insufficient documentation

## 2017-01-03 HISTORY — DX: Malignant neoplasm of lower-outer quadrant of left female breast: C50.512

## 2017-01-03 LAB — URINALYSIS, COMPLETE
BILIRUBIN UA: NEGATIVE
Glucose, UA: NEGATIVE
Ketones, UA: NEGATIVE
LEUKOCYTES UA: NEGATIVE
Nitrite, UA: POSITIVE — AB
PH UA: 5 (ref 5.0–7.5)
PROTEIN UA: NEGATIVE
Specific Gravity, UA: <1.005 — ABNORMAL LOW (ref 1.005–1.030)
Urobilinogen, Ur: 0.2 mg/dL (ref 0.2–1.0)

## 2017-01-03 LAB — MICROSCOPIC EXAMINATION: RENAL EPITHEL UA: NONE SEEN /HPF

## 2017-01-03 MED ORDER — CIPROFLOXACIN HCL 250 MG PO TABS: ORAL_TABLET | ORAL | 0 refills | Status: DC

## 2017-01-03 NOTE — Progress Notes (Signed)
Radiation Oncology         [phone] ________________________________  Initial Outpatient Consultation  Name: Shannon Logan MRN: [id number]  Date: 01/03/2017  DOB: May 25, 1966  [id number] Carlean Jews Evette Doffing, MD  Coralie Keens, MD   REFERRING PHYSICIAN: Coralie Keens, MD  DIAGNOSIS:    ICD-9-CM ICD-10-CM   1. 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 female who presented with self palpated abnormality on 11/19/16. She underwent tomo mammography, notable for a highly suspicious left breast mass with calcifications at the 3:30 position. It measured 1.4 cm on ultrasound. Ultrasound of left axilla was negative. The right breast was negative on mammography. Biopsy of the left breast mass on 11/21/16 showed triple negative invasive ductal carcinoma with characteristics as described above in the diagnosis.  She sees Dr. Whitney Muse on 01/09/17. Apparently, she is not scheduled to see Genetics until March, but she reports she is hoping to move that appointment forward. Dr. Ninfa Linden has scheduled surgery for 01/22/17. Family history is notable for two great aunts and a great grandmother having breast cancer, all on the paternal side of the family, with history of breast cancer. The patient's mother is still living and has never had cancers. The patient's father has no history of cancer. The patient has 1 daughter, who has no history of cancer. 3 paternal aunts and 2 maternal aunts, none of which have a history of cancer.  On review of systems, the patient denies any lymphedema issues. She denies pain, other than that related to a bladder infection which is relieved with AZO. Otherwise  the patient is without complaint. She denies headaches. She reports a sore in her mouth, which comes and goes. She denies bladder or bowel concerns, other than her recent bladder infection.   PREVIOUS RADIATION THERAPY: No  PAST MEDICAL HISTORY:  has a past medical history of GERD (gastroesophageal reflux disease).    PAST SURGICAL HISTORY: Past Surgical History:  Procedure Laterality Date  . COLONOSCOPY N/A 10/26/2015   Procedure: COLONOSCOPY;  Surgeon: Rogene Houston, MD;  Location: [facility];  Service: Endoscopy;  Laterality: N/A;  . DILATION AND CURETTAGE OF UTERUS    . ESOPHAGOGASTRODUODENOSCOPY N/A 10/26/2015   Procedure: ESOPHAGOGASTRODUODENOSCOPY (EGD);  Surgeon: Rogene Houston, MD;  Location: [facility];  Service: Endoscopy;  Laterality: N/A;  1:00  . URETHRAL DILATION      FAMILY HISTORY: family history includes Diabetes in her father; Heart disease in her father; Hyperlipidemia in her mother; Hypertension in her father and mother.  SOCIAL HISTORY:  reports that she quit smoking about 8 years ago. She has never used smokeless tobacco. She reports that she drinks alcohol. She reports that she does not use drugs.  ALLERGIES: Asa [aspirin]; Prednisone; and Sulfa antibiotics  MEDICATIONS:  Current Outpatient Prescriptions  Medication Sig Dispense Refill  . ibuprofen (ADVIL,MOTRIN) 200 MG tablet Take 200 mg by mouth every 6 (six) hours as needed.    . pantoprazole (PROTONIX) 40 MG tablet Take 1 tablet (40 mg total) by mouth daily before breakfast. 90 tablet 6  . UNABLE TO FIND Med Name: probiotic    . acetaminophen (TYLENOL) 500 MG tablet Take 500  mg by mouth every 6 (six) hours as needed.     No current facility-administered medications for this encounter.     REVIEW OF SYSTEMS: A 10+ POINT REVIEW OF SYSTEMS WAS OBTAINED including neurology, dermatology, psychiatry, cardiac, respiratory, lymph, extremities, GI, GU, Musculoskeletal, constitutional, breasts,  reproductive, HEENT.  All pertinent positives are noted in the HPI.  All others are negative.   PHYSICAL EXAM:  height is '5\' 5"'  (1.651 m) and weight is 205 lb 9.6 oz (93.3 kg). Her temperature is 97.9 F (36.6 C). Her blood pressure is 137/87 and her pulse is 79. Her oxygen saturation is 97%.   General: Alert and oriented, in no acute distress. HEENT: Oropharynx and oral cavity are clear, other than a very superficial patch of erythema on the right lateral hard palate.  Neck: Neck is supple, no palpable cervical or supraclavicular lymphadenopathy or masses. Heart: Regular in rate and rhythm with no murmurs. Chest: Clear to auscultation bilaterally. Abdomen: Soft and nontender. Extremities: No edema. Lymphatics: see Neck Exam Skin: No concerning lesions. Breasts: On examination of the left breast, in the 3:30 position of the left breast there is a mass laterally which measures about 1 cm in greatest dimension and is fairly subtle. There are no other palpable masses in the breasts or axillary regions.   ECOG = 0  0 - Asymptomatic (Fully active, able to carry on all predisease activities without restriction)  1 - Symptomatic but completely ambulatory (Restricted in physically strenuous activity but ambulatory and able to carry out work of a light or sedentary nature. For example, light housework, office work)  2 - Symptomatic, <50% in bed during the day (Ambulatory and capable of all self care but unable to carry out any work activities. Up and about more than 50% of waking hours)  3 - Symptomatic, >50% in bed, but not bedbound (Capable of only limited self-care, confined to bed or chair 50% or more of waking hours)  4 - Bedbound (Completely disabled. Cannot carry on any self-care. Totally confined to bed or chair)  5 - Death   Eustace Pen MM, Creech RH, Tormey DC, et al. [phone]). "Toxicity and response criteria of the [facility]". [place] Oncol. 5 (6):  649-55   LABORATORY DATA:  Lab Results  Component Value Date   WBC 8.2 07/02/2013   HGB 14.3 07/02/2013   HCT 40.2 07/02/2013   MCV 90.2 07/02/2013   CMP     Component Value Date/Time   NA 144 10/25/2016 0910   K 5.0 10/25/2016 0910   CL 102 10/25/2016 0910   CO2 23 10/25/2016 0910   GLUCOSE 114 (H) 10/25/2016 0910   GLUCOSE 95 07/02/2013 1448   BUN 13 10/25/2016 0910   CREATININE 0.89 10/25/2016 0910   CREATININE 0.78 07/02/2013 1448   CALCIUM 9.5 10/25/2016 0910   PROT 7.2 10/25/2016 0910   ALBUMIN 4.5 10/25/2016 0910   AST 16 10/25/2016 0910   ALT 19 10/25/2016 0910   ALKPHOS 126 (H) 10/25/2016 0910   BILITOT 0.9 10/25/2016 0910   GFRNONAA 75 10/25/2016 0910   GFRNONAA >89 07/02/2013 1448   GFRAA 87 10/25/2016 0910   GFRAA >89 07/02/2013 1448         RADIOGRAPHY:  As above    IMPRESSION/PLAN: Stage IA, T1cN0M0 Left Breast Invasive Ductal Carcinoma (triple negative) Grade 2.  It was a pleasure meeting the patient today. We discussed the risks, benefits, and side effects of radiotherapy. I recommend radiotherapy to  the Left Breast to reduce her risk of locoregional recurrence by 2/3.  We discussed that radiation would take approximately 6 weeks to complete and that I would give the patient a few weeks to heal following surgery before starting treatment planning. If chemotherapy were to be given, this would precede radiotherapy. We spoke about acute effects including skin irritation and fatigue as well as much less common late effects including internal organ injury or irritation. We spoke about the latest technology including the breathhold technique that is used to minimize the risk of late effects for patients undergoing radiotherapy to the breast or chest wall. No guarantees of treatment were given. The patient is enthusiastic about proceeding with treatment. A consent form was discussed, signed, and placed in the patient's chart. I look forward to participating in the  patient's care.  I will await her referral back to me for postoperative follow-up and eventual CT simulation/treatment planning.  I will ask to see if genetics appointment can be moved up.  Re: the sore over the hard palate.  It is benign appearing. I advised her to discuss re-fitting of her mouth guard with her dentist, and she states this causes the sore to form   __________________________________________   Eppie Gibson, MD   This document serves as a record of services personally performed by Eppie Gibson, MD. It was created on her behalf by Maryla Morrow, a trained medical scribe. The creation of this record is based on the scribe's personal observations and the provider's statements to them. This document has been checked and approved by the attending provider.

## 2017-01-03 NOTE — Progress Notes (Addendum)
REFERRING PROVIDER: [name], MD [place],  [id number]   Ancil Linsey, MD  Coralie Keens, MD  PRIMARY PROVIDER:  [name], MD  PRIMARY REASON FOR VISIT:  1. Malignant neoplasm of lower-outer quadrant of left breast of female, estrogen receptor positive ([place])   2. Family history of breast cancer      HISTORY OF PRESENT ILLNESS:   Ms. Lobban, a 51 y.o. female, was seen for a [facility] cancer genetics consultation at the request of Dr. Ninfa Linden due to a personal and family history of cancer.  Ms. Fannin presents to clinic today to discuss the possibility of a hereditary predisposition to cancer, genetic testing, and to further clarify her future cancer risks, as well as potential cancer risks for family members.   In January 2018, at the age of 67, Ms. 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 Ms. Skyles is scheduled for surgery on January 22, 2017.   CANCER HISTORY:   No history exists.     HORMONAL RISK FACTORS:  Menarche was at age 37.  First live birth at age 71.  OCP use for approximately 20-25 years.  Ovaries intact: yes.  Hysterectomy: no.  Menopausal status: postmenopausal.  HRT use: 0 years. Colonoscopy: yes; normal. Mammogram within the last year: yes. Number of breast biopsies: 1. Up to date with pelvic exams:  yes. Any excessive radiation exposure in the past:  no  Past Medical History:  Diagnosis Date  . Family history of breast cancer   . GERD (gastroesophageal reflux disease)     Past Surgical History:  Procedure Laterality Date  . COLONOSCOPY N/A 10/26/2015   Procedure: COLONOSCOPY;  Surgeon: Rogene Houston, MD;  [facility]: [facility];  Service: Endoscopy;  Laterality: N/A;  . DILATION AND CURETTAGE OF UTERUS    . ESOPHAGOGASTRODUODENOSCOPY N/A 10/26/2015   Procedure: ESOPHAGOGASTRODUODENOSCOPY (EGD);  Surgeon: Rogene Houston, MD;  Location: [facility];  Service: Endoscopy;   Laterality: N/A;  1:00  . URETHRAL DILATION      Social History   Social History  . Marital status: Married    Spouse name: [name]  . Number of children: 3  . Years of education: N/A   Social History Main Topics  . Smoking status: Former Smoker    Quit date: 07/23/2008  . Smokeless tobacco: Never Used     Comment: quit smoking 07/23/2008 after smoking 30 yrs. (1/2 pack a day)  . Alcohol use 0.0 oz/week     Comment: rare  . Drug use: No  . Sexual activity: Yes   Other Topics Concern  . None   Social History Narrative  . None     FAMILY HISTORY:  We obtained a detailed, 4-generation family history.  Significant diagnoses are listed below: Family History  Problem Relation Age of Onset  . Hypertension Mother   . Hyperlipidemia Mother   . Heart disease Father   . Hypertension Father   . Diabetes Father   . Aneurysm Maternal Grandmother   . Stroke Maternal Grandfather   . Heart disease Paternal Grandfather   . Breast cancer Other     PGF's sister  . Breast cancer Other     PGF's sister  . Breast cancer Other     PGF's aunt (great, great aunt)  . Breast cancer Other     PGM's mother    The patient had three children.  One son died in a house  fire at age 54.  She has one brother who is cancer free.  Her father died from sepsis at 67.  Her mother is alive and well.  She had four brother and two sisters, none who had cancer.  There is no other reported cancer on the maternal side.  The patient's father had two brothers and three sisters, none who had cancer.  His parents died of a heart attack and old age.  His mother's mother had breast cancer at an unknown age, and his father had two sisters and an aunt who had breast cancer.  Ms. Knodel is unaware of previous family history of genetic testing for hereditary cancer risks. Patient's maternal ancestors are of Caucasian descent, and paternal ancestors are of [place], [place] and [place] descent. There is no reported Ashkenazi  Jewish ancestry. There is no known consanguinity.  GENETIC COUNSELING ASSESSMENT: BYRON TIPPING is a 51 y.o. female with a personal and family history of breast cancer which is somewhat suggestive of a hereditary cancer syndrome and predisposition to cancer. We, therefore, discussed and recommended the following at today's visit.   DISCUSSION: We discussed that about 5-10% of breast cancer is hereditary with most cases due to BRCA mutations.  Individuals with triple negative disease are at a higher risk for BRCA1 mutations, but also mutations in other genes such as PALB2.  Other hereditary genes associated with breast cancer include ATM and CHEK2.  We reviewed the characteristics, features and inheritance patterns of hereditary cancer syndromes. We also discussed genetic testing, including the appropriate family members to test, the process of testing, insurance coverage and turn-around-time for results. We discussed the implications of a negative, positive and/or variant of uncertain significant result. We recommended Ms. Calvi pursue genetic testing for the [employer]. The Breast/GYN gene panel offered by [facility] includes sequencing and rearrangement analysis for the following 23 genes:  ATM, BARD1, BRCA1, BRCA2, BRIP1, CDH1, CHEK2, EPCAM, FANCC, MLH1, MSH2, MSH6, MUTYH, NBN, NF1, PALB2, PMS2, POLD1, PTEN, RAD51C, RAD51D, RECQL, and TP53.     Based on Ms. Fredin's personal and family history of cancer, she meets medical criteria for genetic testing. Despite that she meets criteria, she may still have an out of pocket cost. We discussed that if her out of pocket cost for testing is over $100, the laboratory will call and confirm whether she wants to proceed with testing.  If the out of pocket cost of testing is less than $100 she will be billed by the genetic testing laboratory.   PLAN: After considering the risks, benefits, and limitations, Ms. Schollmeyer  provided informed consent to pursue genetic  testing and the blood sample was sent to [facility] for analysis of the [employer]  The results have been placed on a [facility], so results should be available within approximately 2-3 weeks' time, at which point they will be disclosed by telephone to Ms. Minkoff, as will any additional recommendations warranted by these results. Ms. Muratalla will receive a summary of her genetic counseling visit and a copy of her results once available. This information will also be available in Epic. We encouraged Ms. Hounshell to remain in contact with cancer genetics annually so that we can continuously update the family history and inform her of any changes in cancer genetics and testing that may be of benefit for her family. Ms. Carberry questions were answered to her satisfaction today. Our contact information was provided should additional questions or concerns arise.  Lastly, we encouraged Ms.  Lange to remain in contact with cancer genetics annually so that we can continuously update the family history and inform her of any changes in cancer genetics and testing that may be of benefit for this family.   Ms.  Cutsforth questions were answered to her satisfaction today. Our contact information was provided should additional questions or concerns arise. Thank you for the referral and allowing [place] to share in the care of your patient.   Neshawn Aird P. Florene Glen, [place], [facility] Certified Genetic Counselor [email or web address] phone: [phone]  The patient was seen for a total of 30 minutes in face-to-face genetic counseling.  This patient was discussed with Drs. Magrinat, Lindi Adie and/or Burr Medico who agrees with the above.    _______________________________________________________________________ For Office Staff:  Number of people involved in session: 2 Was an Intern/ student involved with case: no

## 2017-01-03 NOTE — Progress Notes (Signed)
  Subjective:   Patient ID: Shannon Logan, female    DOB: 03/13/1966, 51 y.o.   MRN: [id number] CC: Urinary Frequency; Urinary Urgency; and burning with urination  HPI: Shannon Logan is a 51 y.o. female presenting for Urinary Frequency; Urinary Urgency; and burning with urination  Burning for the past 5 days Has burning without the Azo Been taking cranberry pills as well As soon as stops the azo, burning returns, bothering her a lot Appetite slightly down No abd pain No back pain  Has upcoming surgery for new breast ca beginning of Feb Met with genetic counseling this morning, feels like all of her questions have been answered.  Numbness tingling in L arm is mostly gone now that she is not wearing underwire   Relevant past medical, surgical, family and social history reviewed. Allergies and medications reviewed and updated. History  Smoking Status  . Former Smoker  . Quit date: 07/23/2008  Smokeless Tobacco  . Never Used    Comment: quit smoking 07/23/2008 after smoking 30 yrs. (1/2 pack a day)   ROS: Per HPI   Objective:    BP 125/75   Pulse 74   Temp 97.3 F (36.3 C) (Oral)   Ht _0  (1.651 m)   Wt 208 lb 9.6 oz (94.6 kg)   BMI 34.71 kg/m   Wt Readings from Last 3 Encounters:  01/03/17 208 lb 9.6 oz (94.6 kg)  01/03/17 205 lb 9.6 oz (93.3 kg)  11/15/16 198 lb 3.2 oz (89.9 kg)    Gen: NAD, alert, cooperative with exam, NCAT EYES: EOMI, no conjunctival injection, or no icterus CV: NRRR, normal S1/S2, no murmur, distal pulses 2+ b/l Resp: CTABL, no wheezes, normal WOB Abd: +BS, soft, NTND. no guarding or organomegaly, no CVA tenderness Ext: No edema, warm Neuro: Alert and oriented  Assessment & Plan:  Shannon Logan was seen today for urinary frequency, urinary urgency and burning with urination.  Diagnoses and all orders for this visit:  UTI symptoms -     Urinalysis, Complete  Cystitis -     Urine culture -     ciprofloxacin (CIPRO) 250 MG tablet; Take BID for  3 days.  Other orders -     Microscopic Examination  Follow up plan: Return if symptoms worsen or fail to improve. Assunta Found, MD [place]

## 2017-01-05 LAB — URINE CULTURE

## 2017-01-08 ENCOUNTER — Ambulatory Visit (HOSPITAL_COMMUNITY): Payer: BLUE CROSS/BLUE SHIELD | Admitting: Hematology & Oncology

## 2017-01-09 ENCOUNTER — Encounter (HOSPITAL_COMMUNITY): Payer: BLUE CROSS/BLUE SHIELD | Attending: Hematology & Oncology | Admitting: Hematology & Oncology

## 2017-01-09 ENCOUNTER — Encounter (HOSPITAL_COMMUNITY): Payer: Self-pay | Admitting: Hematology & Oncology

## 2017-01-09 VITALS — BP 132/72 | HR 62 | Temp 98.3°F | Resp 16 | Ht 64.5 in | Wt 204.7 lb

## 2017-01-09 DIAGNOSIS — Z17421 Hormone receptor negative with human epidermal growth factor receptor 2 negative status: Secondary | ICD-10-CM

## 2017-01-09 DIAGNOSIS — Z803 Family history of malignant neoplasm of breast: Secondary | ICD-10-CM

## 2017-01-09 DIAGNOSIS — C50512 Malignant neoplasm of lower-outer quadrant of left female breast: Secondary | ICD-10-CM

## 2017-01-09 DIAGNOSIS — Z87891 Personal history of nicotine dependence: Secondary | ICD-10-CM

## 2017-01-09 DIAGNOSIS — C50912 Malignant neoplasm of unspecified site of left female breast: Secondary | ICD-10-CM | POA: Diagnosis not present

## 2017-01-09 DIAGNOSIS — Z171 Estrogen receptor negative status [ER-]: Secondary | ICD-10-CM

## 2017-01-09 DIAGNOSIS — C50919 Malignant neoplasm of unspecified site of unspecified female breast: Secondary | ICD-10-CM

## 2017-01-09 NOTE — Patient Instructions (Addendum)
[place] at [facility] Discharge Instructions  RECOMMENDATIONS MADE BY THE CONSULTANT AND ANY TEST RESULTS WILL BE SENT TO YOUR REFERRING PHYSICIAN.  You were seen today by Dr. Whitney Muse Follow up after your surgery    Thank you for choosing [place] at [facility] to provide your oncology and hematology care.  To afford each patient quality time with our provider, please arrive at least 15 minutes before your scheduled appointment time.    If you have a lab appointment with the [facility] please come in thru the  Main Entrance and check in at the main information desk  You need to re-schedule your appointment should you arrive 10 or more minutes late.  We strive to give you quality time with our providers, and arriving late affects you and other patients whose appointments are after yours.  Also, if you no show three or more times for appointments you may be dismissed from the clinic at the providers discretion.     Again, thank you for choosing [facility].  Our hope is that these requests will decrease the amount of time that you wait before being seen by our physicians.       _____________________________________________________________  Should you have questions after your visit to [facility], please contact our office at [phone] between the hours of 8:30 a.m. and 4:30 p.m.  Voicemails left after 4:30 p.m. will not be returned until the following business day.  For prescription refill requests, have your pharmacy contact our office.       Resources For Cancer Patients and their Caregivers ? American Cancer Society: Can assist with transportation, wigs, general needs, runs Look Good Feel Better.        9802072134 ? Cancer Care: Provides financial assistance, online support groups, medication/co-pay assistance.  1-800-813-HOPE 416 423 9724) ? [place] Assists [place]  cancer patients and their families through emotional , educational and financial support.  978 540 9470 ? [place] DSS Where to apply for food stamps, Medicaid and utility assistance. (617) 477-6180 ? [facility]: Transportation to medical appointments. 856-628-6058 ? Social Security Administration: May apply for disability if have a Stage IV cancer. (845) 539-9015 564-001-1036 ? [employer], Disability and Transit Services: Assists with nutrition, care and transit needs. [place] Support Programs: @10RELATIVEDAYS @ > Cancer Support Group  2nd Tuesday of the month 1pm-2pm, Journey Room  > Creative Journey  3rd Tuesday of the month 1130am-1pm, Journey Room  > Look Good Feel Better  1st Wednesday of the month 10am-12 noon, Journey Room (Call [place] to register [phone])

## 2017-01-09 NOTE — Progress Notes (Signed)
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 female is here because of referral by Dr. Coralie Keens MD for evaluation of triple negative left breast cancer.   As per Dr. Trevor Mace notes on 12/05/16 " The patent is a 51 year old female who presents with breast cancer. This patient is referred by Dr. Assunta Found after the recent diagnosis of a left breast cancer. She has no previous history of breast cancer or need for biopsies. She does have a family history of 2 aunts having had breast cancer. She had mammograms and ultrasounds showing a mass as well as calcifications. The total size of the area is 2.5 cm. She had a stereo guided biopsy showing it to be an invasive ductal carcinoma which was triple negative."   She presents to the clinic today unaccompanied for evaluation of triple negative left breast cancer.   She is scheduled for surgery on Jan 22, 2017. She is scheduled for port placement at the time of surgery as well.  She doesn't remember how long ago she had a mammogram. She notes that she did have one scheduled in August but missed it. She thinks it has been at least 5 years and she doesn't get them regularly. She does periodic self breast exams and she found the lump while doing one of them one day after coming back from the doctor for a physical.   She stated that she was informed about her triple negative left breast cancer from her doctor.   She states they did an ECHO on her [place] when she felt really dizzy.   Reports she had a stress test done right before she found out she had cancer at [facility] in December. She had it done on a treadmill. She reports her stress  test was normal.   She notes that she is schedule for genetic testing. She understands that the results of this will affect her surgical choices. For now she is scheduled for a lumpectomy and sentinel node procedure. She understands that she needs chemotherapy as well.    HORMONAL RISK FACTORS:  Menarche was at age 53.  First live birth at age 15.  OCP use for approximately 20-25 years.  Ovaries intact: yes.  Hysterectomy: no.  Menopausal status: postmenopausal.  HRT use: 0 years. Colonoscopy: yes; normal. Mammogram within the last year: yes. Number of breast biopsies: 1. Up to date with pelvic exams:  yes. Any excessive radiation exposure in the past:  no   MEDICAL HISTORY:  Past Medical History:  Diagnosis Date  . Cancer ([facility]) 11/22/2016   Breast   . Family history of breast cancer   . GERD (gastroesophageal reflux disease)     SURGICAL HISTORY: Past Surgical History:  Procedure Laterality Date  . COLONOSCOPY N/A 10/26/2015   Procedure: COLONOSCOPY;  Surgeon: Rogene Houston, MD;  [facility];  Service: Endoscopy;  Laterality: N/A;  . DILATION AND CURETTAGE OF UTERUS    . ESOPHAGOGASTRODUODENOSCOPY N/A 10/26/2015   Procedure: ESOPHAGOGASTRODUODENOSCOPY (EGD);  Surgeon: Rogene Houston, MD;  Location: [facility];  Service: Endoscopy;  Laterality: N/A;  1:00  . URETHRAL DILATION      SOCIAL HISTORY: Social  History   Social History  . Marital status: Married    Spouse name: [name]  . Number of children: 3  . Years of education: N/A   Occupational History  . Not on file.   Social History Main Topics  . Smoking status: Former Smoker    Quit date: 07/23/2008  . Smokeless tobacco: Never Used     Comment: quit smoking 07/23/2008 after smoking 30 yrs. (1/2 pack a day)  . Alcohol use 0.0 oz/week     Comment: rare  . Drug use: No  . Sexual activity: Yes   Other Topics Concern  . Not on file   Social History Narrative  . No narrative on file    Social Hx:  Married 25 years 3 children Former smoker. Quit 07/24/2008 occasionally alcohol use Hobbies: travels with her daughter who plays travel softball, crochets  She works as a Art therapist Has a fish, dog, and cat.  FAMILY HISTORY: Family History  Problem Relation Age of Onset  . Hypertension Mother   . Hyperlipidemia Mother   . Heart disease Father   . Hypertension Father   . Diabetes Father   . Aneurysm Maternal Grandmother   . Stroke Maternal Grandfather   . Heart disease Paternal Grandfather   . Breast cancer Other     PGF's sister  . Breast cancer Other     PGF's sister  . Breast cancer Other     PGF's aunt (great, great aunt)  . Breast cancer Other     PGM's mother   Family Hx:  The patient had three children.  One son died in a house fire at age 81.  She has one brother who is cancer free.  Her father died from sepsis at 80.  Her mother is alive and well.  She had four brother and two sisters, none who had cancer.  There is no other reported cancer on the maternal side.  The patient's father had two brothers and three sisters, none who had cancer.  His parents died of a heart attack and old age.  His mother's mother had breast cancer at an unknown age, and his father had two sisters and an aunt who had breast cancer.  Ms. Schmuhl is unaware of previous family history of genetic testing for hereditary cancer risks. Patient's maternal ancestors are of Caucasian descent, and paternal ancestors are of [place], [place] and [place] descent. There is no reported Ashkenazi Jewish ancestry. There is no known consanguinity.  1 brother- healthy  ALLERGIES:  is allergic to asa [aspirin]; prednisone; and sulfa antibiotics.  MEDICATIONS:  Current Outpatient Prescriptions  Medication Sig Dispense Refill  . acetaminophen (TYLENOL) 500 MG tablet Take 500 mg by mouth every 6 (six) hours as needed.    . ciprofloxacin (CIPRO) 250 MG tablet Take BID 20 tablet 0  .  ibuprofen (ADVIL,MOTRIN) 200 MG tablet Take 200 mg by mouth every 6 (six) hours as needed.    . pantoprazole (PROTONIX) 40 MG tablet Take 1 tablet (40 mg total) by mouth daily before breakfast. 90 tablet 6  . UNABLE TO FIND Med Name: probiotic     No current facility-administered medications for this visit.     Review of Systems  Constitutional: Negative.   HENT: Negative.   Eyes: Negative.   Respiratory: Negative.   Cardiovascular: Negative.   Gastrointestinal: Negative.   Genitourinary: Negative.   Musculoskeletal: Negative.   Skin: Negative.   Neurological: Negative.   Endo/Heme/Allergies: Negative.   Psychiatric/Behavioral: Negative.  All other systems reviewed and are negative.  14 point ROS was done and is otherwise as detailed above or in HPI   PHYSICAL EXAMINATION: ECOG PERFORMANCE STATUS: 0 - Asymptomatic  Vitals:   01/09/17 1524  BP: 132/72  Pulse: 62  Resp: 16  Temp: 98.3 F (36.8 C)   Filed Weights   01/09/17 1524  Weight: 204 lb 11.2 oz (92.9 kg)     Physical Exam  Constitutional: She is oriented to person, place, and time and well-developed, well-nourished, and in no distress.  HENT:  Head: Normocephalic and atraumatic.  Mouth/Throat: No oropharyngeal exudate.  Eyes: Conjunctivae and EOM are normal. Pupils are equal, round, and reactive to light. No scleral icterus.  Neck: Normal range of motion. Neck supple.  Cardiovascular: Normal rate, regular rhythm and normal heart sounds.   Pulmonary/Chest: Effort normal and breath sounds normal. No respiratory distress. She has no wheezes.    Abdominal: Soft. Bowel sounds are normal. She exhibits no distension and no mass. There is no tenderness. There is no rebound and no guarding.  Musculoskeletal: Normal range of motion. She exhibits no edema.  Lymphadenopathy:    She has no cervical adenopathy.  Neurological: She is alert and oriented to person, place, and time. No cranial nerve deficit. Gait normal.   Skin: Skin is warm and dry.  Psychiatric: Mood, memory, affect and judgment normal.  Nursing note and vitals reviewed.   LABORATORY DATA:  I have reviewed the data as listed Lab Results  Component Value Date   WBC 8.2 07/02/2013   HGB 14.3 07/02/2013   HCT 40.2 07/02/2013   MCV 90.2 07/02/2013   CMP     Component Value Date/Time   NA 144 10/25/2016 0910   K 5.0 10/25/2016 0910   CL 102 10/25/2016 0910   CO2 23 10/25/2016 0910   GLUCOSE 114 (H) 10/25/2016 0910   GLUCOSE 95 07/02/2013 1448   BUN 13 10/25/2016 0910   CREATININE 0.89 10/25/2016 0910   CREATININE 0.78 07/02/2013 1448   CALCIUM 9.5 10/25/2016 0910   PROT 7.2 10/25/2016 0910   ALBUMIN 4.5 10/25/2016 0910   AST 16 10/25/2016 0910   ALT 19 10/25/2016 0910   ALKPHOS 126 (H) 10/25/2016 0910   BILITOT 0.9 10/25/2016 0910   GFRNONAA 75 10/25/2016 0910   GFRNONAA >89 07/02/2013 1448   GFRAA 87 10/25/2016 0910   GFRAA >89 07/02/2013 1448     RADIOGRAPHIC STUDIES: I have personally reviewed the radiological images as listed and agreed with the findings in the report. No results found.  Study Highlights     Blood pressure demonstrated a normal response to exercise.  Upsloping ST segment depression ST segment depression of 0.5 mm was noted during stress in the V4 and V5 leads, beginning at 5 minutes of stress, ending at 7 minutes of stress, and returning to baseline after less than 1 minute of recovery.  No T wave inversion was noted during stress.   Nonspecific upsloping ST changes in V4 and V5. Asymptomatic throughout, this does not indicate an abnormal stress test. Repeat testing if symptoms arise or in 2-3 years Caryl Pina, MD [facility] 11/27/2016, 8:07 AM    Addendum   ADDENDUM REPORT: 11/22/2016 11:34  ADDENDUM: Pathology revealed grade II invasive ductal carcinoma in the left breast. This was found to be concordant by Dr. Marin Olp. Pathology results were  discussed with the patient by telephone. The patient reported doing well after the biopsy with tenderness at  the site. Post biopsy instructions and care were reviewed and questions were answered. The patient was encouraged to call [facility] for any additional concerns. Surgical consultation has been arranged with Dr. Nedra Hai at [facility] on December 04, 2016.  Pathology results reported by Susa Raring RN, BSN on 11/22/2016.   Electronically Signed   By: Marin Olp M.D.   On: 11/22/2016 11:34   Addended by Marin Olp, MD on 11/22/2016 1:08 PM    Study Result   CLINICAL DATA:  Patient presents for ultrasound-guided core needle biopsy of a 1.4 cm mass with irregular shape and borders and associated microcalcifications over the 3:30 position of the left breast 8 cm from the nipple.  EXAM: ULTRASOUND GUIDED LEFT BREAST CORE NEEDLE BIOPSY  COMPARISON:  Previous exam(s).  FINDINGS: I met with the patient and we discussed the procedure of ultrasound-guided biopsy, including benefits and alternatives. We discussed the high likelihood of a successful procedure. We discussed the risks of the procedure, including infection, bleeding, tissue injury, clip migration, and inadequate sampling. Informed written consent was given. The usual time-out protocol was performed immediately prior to the procedure.  Using sterile technique and 1% Lidocaine as local anesthetic, under direct ultrasound visualization, a 12 gauge spring-loaded biopsy device was used to perform biopsy of the targeted mass at the 3:30 position of the left breast using a inferior to superior approach. Three adequate tissue specimens were obtained. At the conclusion of the procedure a coil shaped tissue marker clip was deployed into the biopsy cavity. Follow up 2 view mammogram was performed and dictated separately.  IMPRESSION: Ultrasound  guided biopsy of highly suspicious left breast mass. No apparent complications.  Electronically Signed: By: Marin Olp M.D. On: 11/21/2016 16:34     Study Result   CLINICAL DATA:  Patient is post ultrasound-guided core needle biopsy of a 1.4 cm spiculated mass over the 3:30 position of the left breast 8 cm from the nipple.  EXAM: DIAGNOSTIC LEFT MAMMOGRAM POST ULTRASOUND BIOPSY  COMPARISON:  Previous exam(s).  FINDINGS: Mammographic images were obtained following ultrasound guided biopsy of the targeted mass at the 3:30 position. Images demonstrates satisfactory placement of a coil shaped metallic clip within the biopsied mass over the outer lower left breast.  IMPRESSION: Satisfactory placement of a coil shaped metallic clip post ultrasound core needle biopsy left breast mass.  Final Assessment: Post Procedure Mammograms for Marker Placement   Electronically Signed   By: Marin Olp M.D.   On: 11/21/2016 16:42   PATHOLOGY:        ASSESSMENT & PLAN:  Triple negative carcinoma L breast History Tobacco Use  She was provided with information from the NCCN and our breast cancer navigation book. We reviewed the different types of breast cancer. WE discussed current guidelines regarding the need for chemotherapy in triple negative disease.  She has already had genetic counseling. She is awaiting final results. Currently she is scheduled for an upcoming lumpectomy, sentinel node and port placement. See below:  As per Dr. Trevor Mace note on 12/05/16: " Assessment & Plan Breast cancer, left 9174.9   C50.912)  Today's Impression:  This is a patient with a triple negative left breast cancer. I discussed the diagnosis with the patient and her family. I gave them a copy of the pathology report. She is triple negative. We discussed her in the multidisciplinary breast cancer conference. She is a candidate for breast conservation. I discussed lumpectomy with  postoperative radiation versus mastectomy with her. We discussed sentinel lymph node biopsy. We also discussed Port-A-Cath Insertion. It is also been recommended that she be referred for genetic counseling. We discussed mastectomies bilaterally if she was genetically positive. After a long discussion, she was to go ahead and be scheduled for visits with both medical and radiation oncology as well as genetic testing. I will also tentatively schedule her for a left breast lumpectomy with sentinel lymph node biopsy and a Port-A-Cath insertion."   Mammography reviewed. Results noted above.  We did discuss chemotherapy today. I have discussed the risks and benefits of chemotherapy including the risks of nausea/ vomiting, risk of infection from low WBC count, fatigue due to chemo or anemia, bruising or bleeding due to low platelets, mouth sores, loss/ change in taste and decreased appetite. Liver and kidney function will be monitored through out chemotherapy as abnormalities in liver and kidney function may be a side effect of treatment.  Cardiac dysfunction due to Adriamycin and Risk of permanent bone marrow dysfunction and leukemia due to chemo were also discussed.  Patient will be set up for chemotherapy education class prior to starting chemotherapy.  I informed her about meeting [facility] for chemotherapy teaching classes. She will meet [place] [facility] today.   RTC 1 week post-surgery.  All questions were answered. The patient knows to call the clinic with any problems, questions or concerns.  This document serves as a record of services personally performed by Ancil Linsey, MD. It was created on her behalf by Shirlean Mylar, a trained medical scribe. The creation of this record is based on the scribe's personal observations and the provider's statements to them. This document has been checked and approved by the attending provider.  I have reviewed the above documentation for accuracy and completeness  and I agree with the above.  This note was electronically signed.   Kelby Fam. Gertie Broerman, M.D.  01/09/2017 4:41 PM

## 2017-01-09 NOTE — Progress Notes (Signed)
Met with pt face to face.  Introduced myself and explain a little bit about my role as the patient navigator.  Pt given a card with all my information on it.  Told pt to call if they had any questions or concerns.  Pt verbalized understanding.   Pt will follow up after lumpectomy.  Pt will have port placed at surgery also.

## 2017-01-15 ENCOUNTER — Other Ambulatory Visit: Payer: Self-pay | Admitting: Surgery

## 2017-01-15 DIAGNOSIS — Z853 Personal history of malignant neoplasm of breast: Secondary | ICD-10-CM

## 2017-01-16 ENCOUNTER — Encounter (HOSPITAL_BASED_OUTPATIENT_CLINIC_OR_DEPARTMENT_OTHER): Payer: Self-pay | Admitting: *Deleted

## 2017-01-16 ENCOUNTER — Telehealth: Payer: Self-pay | Admitting: Genetic Counselor

## 2017-01-16 ENCOUNTER — Other Ambulatory Visit: Payer: Self-pay | Admitting: Surgery

## 2017-01-16 NOTE — Telephone Encounter (Signed)
Revealed negative genetic testing.  Explained that we do not know why she has cancer or why there is cancer in the family.  It does not appear that there is a mutation in the genes we looked at that explains this.  Discussed that there could be other genes that we have not tested, or in the future our technology could improve to the point where we realize that we are missing mutations. Therefore, it will be important for her to keep in contact with genetics so that if updated testing is needed she would know about it.

## 2017-01-16 NOTE — Telephone Encounter (Signed)
LM on VM with good news.  Asked that she CB. 

## 2017-01-20 ENCOUNTER — Ambulatory Visit: Payer: Self-pay | Admitting: Genetic Counselor

## 2017-01-20 DIAGNOSIS — Z1379 Encounter for other screening for genetic and chromosomal anomalies: Secondary | ICD-10-CM

## 2017-01-20 DIAGNOSIS — C50512 Malignant neoplasm of lower-outer quadrant of left female breast: Secondary | ICD-10-CM

## 2017-01-20 DIAGNOSIS — Z170 Estrogen receptor positive status [ER+]: Principal | ICD-10-CM

## 2017-01-20 DIAGNOSIS — Z803 Family history of malignant neoplasm of breast: Secondary | ICD-10-CM

## 2017-01-20 NOTE — Progress Notes (Signed)
HPI: Ms. Pownall was previously seen in the [facility] clinic due to a personal and family history of cancer and concerns regarding a hereditary predisposition to cancer. Please refer to our prior cancer genetics clinic note for more information regarding Ms. Ciulla's medical, social and family histories, and our assessment and recommendations, at the time. Ms. Cleere recent genetic test results were disclosed to her, as were recommendations warranted by these results. These results and recommendations are discussed in more detail below.  CANCER HISTORY:   No history exists.    FAMILY HISTORY:  We obtained a detailed, 4-generation family history.  Significant diagnoses are listed below: Family History  Problem Relation Age of Onset  . Hypertension Mother   . Hyperlipidemia Mother   . Heart disease Father   . Hypertension Father   . Diabetes Father   . Aneurysm Maternal Grandmother   . Stroke Maternal Grandfather   . Heart disease Paternal Grandfather   . Breast cancer Other     PGF's sister  . Breast cancer Other     PGF's sister  . Breast cancer Other     PGF's aunt (great, great aunt)  . Breast cancer Other     PGM's mother    The patient had three children.  One son died in a house fire at age 49.  She has one brother who is cancer free.  Her father died from sepsis at 86.  Her mother is alive and well.  She had four brother and two sisters, none who had cancer.  There is no other reported cancer on the maternal side.  The patient's father had two brothers and three sisters, none who had cancer.  His parents died of a heart attack and old age.  His mother's mother had breast cancer at an unknown age, and his father had two sisters and an aunt who had breast cancer.  Ms. Stradford is unaware of previous family history of genetic testing for hereditary cancer risks. Patient's maternal ancestors are of Caucasian descent, and paternal ancestors are of [place], [place] and  [place] descent. There is no reported Ashkenazi Jewish ancestry. There is no known consanguinity.  GENETIC TEST RESULTS: Genetic testing reported out on January 15, 2017 through the [facility] cancer panel found no deleterious mutations. Negative genetic testing for the MSH2 inversion analysis (B[place] inversion).  The Breast/GYN gene panel offered by [facility] includes sequencing and rearrangement analysis for the following 23 genes:  ATM, BARD1, BRCA1, BRCA2, BRIP1, CDH1, CHEK2, EPCAM, FANCC, MLH1, MSH2, MSH6, MUTYH, NBN, NF1, PALB2, PMS2, POLD1, PTEN, RAD51C, RAD51D, RECQL, and TP53.   The test report has been scanned into EPIC and is located under the Molecular Pathology section of the Results Review tab.   We discussed with Ms. Cueto that since the current genetic testing is not perfect, it is possible there may be a gene mutation in one of these genes that current testing cannot detect, but that chance is small. We also discussed, that it is possible that another gene that has not yet been discovered, or that we have not yet tested, is responsible for the cancer diagnoses in the family, and it is, therefore, important to remain in touch with cancer genetics in the future so that we can continue to offer Ms. Paulo the most up to date genetic testing.   CANCER SCREENING RECOMMENDATIONS: This result is reassuring and indicates that Ms. Babino likely does not have an increased risk for a future cancer due to  a mutation in one of these genes. This normal test also suggests that Ms. Swords's cancer was most likely not due to an inherited predisposition associated with one of these genes.  Most cancers happen by chance and this negative test suggests that her cancer falls into this category.  We, therefore, recommended she continue to follow the cancer management and screening guidelines provided by her oncology and primary healthcare provider.   RECOMMENDATIONS FOR FAMILY MEMBERS: Women in this family might be at  some increased risk of developing cancer, over the general population risk, simply due to the family history of cancer. We recommended women in this family have a yearly mammogram beginning at age 22, or 33 years younger than the earliest onset of cancer, an annual clinical breast exam, and perform monthly breast self-exams. Women in this family should also have a gynecological exam as recommended by their primary provider. All family members should have a colonoscopy by age 4.  FOLLOW-UP: Lastly, we discussed with Ms. Muscarella that cancer genetics is a rapidly advancing field and it is possible that new genetic tests will be appropriate for her and/or her family members in the future. We encouraged her to remain in contact with cancer genetics on an annual basis so we can update her personal and family histories and let her know of advances in cancer genetics that may benefit this family.   Our contact number was provided. Ms. Abate questions were answered to her satisfaction, and she knows she is welcome to call us at anytime with additional questions or concerns.   Roma Kayser, MS, [facility] Certified Genetic Counselor [email or web address]

## 2017-01-21 ENCOUNTER — Ambulatory Visit
Admission: RE | Admit: 2017-01-21 | Discharge: 2017-01-21 | Disposition: A | Discharge status: Home / Self Care | Payer: BLUE CROSS/BLUE SHIELD | Source: Ambulatory Visit | Attending: Surgery | Admitting: Surgery

## 2017-01-21 DIAGNOSIS — Z853 Personal history of malignant neoplasm of breast: Secondary | ICD-10-CM

## 2017-01-21 NOTE — Pre-Procedure Instructions (Signed)
Boost Breeze 8 oz. given to pt. with instructions to drink by 0430 DOS; pt. voiced understanding.

## 2017-01-21 NOTE — H&P (Signed)
Shannon Logan  Location: [facility] Surgery Patient #: [id number] DOB: [birth year] Married / Language: English / Race: White Female   History of Present Illness (Alfie Rideaux A. Ninfa Linden MD;  The patient is a 51 year old female who presents with breast cancer. This patient is referred by Dr. Assunta Found after the recent diagnosis of a left breast cancer. She is actually palpated the mass herself. It had been several years since her last mammogram. She denies nipple discharge. She has no previous history of breast cancer or need for biopsies. She does have a family history of 2 aunts having had breast cancer. She is otherwise healthy without complaints. She had mammograms and ultrasounds showing a mass as well as calcifications. The total size of the area is 2.5 cm. 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   Past Surgical History Benjiman Core, [place];  Breast Biopsy  Left. Oral Surgery   Diagnostic Studies History Benjiman Core, [place];  Colonoscopy  1-5 years ago Mammogram  >3 years ago Pap Smear  1-5 years ago  Allergies (Armen Eulas Post, CMA;  ASPIRIN  PREDNISONE  Sulfa Antibiotics   Medication History (Armen Eulas Post, CMA Pantoprazole Sodium (40MG  Tablet DR, Oral) Active. Tylenol (500MG  Capsule, Oral) Active. Ibuprofen (200MG  Tablet, Oral) Active. Medications Reconciled  Social History Benjiman Core, CMA;  Alcohol use  Occasional alcohol use. Caffeine use  Carbonated beverages, Tea. No drug use  Tobacco use  Former smoker.  Family History Benjiman Core, [place]; Breast Cancer  Family Members In General. Cerebrovascular Accident  Family Members In General. Diabetes Mellitus  Family Members In General, Father. Heart Disease  Family Members In General. Hypertension  Father, Mother. Ischemic Bowel Disease  Father. Migraine Headache  Family Members In General. Thyroid problems  Family Members In  General, Mother.  Pregnancy / Birth History Benjiman Core, [place]; Age at menarche  52 years. Contraceptive History  Oral contraceptives. Gravida  6 Irregular periods  Length (months) of breastfeeding  3-6 Maternal age  79-20 Para  3  Other Problems (Armen Eulas Post, [place];  Breast Cancer  Gastroesophageal Reflux Disease  Heart murmur  Lump In Breast     Review of Systems ([facility] CMA;  General Present- Night Sweats. Not Present- Appetite Loss, Chills, Fatigue, Fever, Weight Gain and Weight Loss. Skin Not Present- Change in Wart/Mole, Dryness, Hives, Jaundice, New Lesions, Non-Healing Wounds, Rash and Ulcer. HEENT Present- Seasonal Allergies. Not Present- Earache, Hearing Loss, Hoarseness, Nose Bleed, Oral Ulcers, Ringing in the Ears, Sinus Pain, Sore Throat, Visual Disturbances, Wears glasses/contact lenses and Yellow Eyes. Respiratory Present- Snoring. Not Present- Bloody sputum, Chronic Cough, Difficulty Breathing and Wheezing. Breast Present- Breast Mass. Not Present- Breast Pain, Nipple Discharge and Skin Changes. Cardiovascular Present- Leg Cramps and Palpitations. Not Present- Chest Pain, Difficulty Breathing Lying Down, Rapid Heart Rate, Shortness of Breath and Swelling of Extremities. Gastrointestinal Present- Bloating and Excessive gas. Not Present- Abdominal Pain, Bloody Stool, Change in Bowel Habits, Chronic diarrhea, Constipation, Difficulty Swallowing, Gets full quickly at meals, Hemorrhoids, Indigestion, Nausea, Rectal Pain and Vomiting. Female Genitourinary Not Present- Frequency, Nocturia, Painful Urination, Pelvic Pain and Urgency. Musculoskeletal Not Present- Back Pain, Joint Pain, Joint Stiffness, Muscle Pain, Muscle Weakness and Swelling of Extremities. Neurological Present- Numbness and Tingling. Not Present- Decreased Memory, Fainting, Headaches, Seizures, Tremor, Trouble walking and Weakness. Psychiatric Not Present- Anxiety, Bipolar, Change in Sleep  Pattern, Depression, Fearful and Frequent crying. Endocrine Not Present- Cold Intolerance, Excessive Hunger,  Hair Changes, Heat Intolerance, Hot flashes and New Diabetes. Hematology Not Present- Blood Thinners, Easy Bruising, Excessive bleeding, Gland problems, HIV and Persistent Infections.  Vitals  Weight: 201.5 lb Height: 64in Body Surface Area: 1.96 m Body Mass Index: 34.59 kg/m  Temp.: 98.82F  Pulse: 65 (Regular)  P.OX: 98% (Room air) BP: 146/82 (Sitting, Left Arm, Standard)    Physical Exam  General Mental Status-Alert. General Appearance-Consistent with stated age. Hydration-Well hydrated. Voice-Normal.  Head and Neck Head-normocephalic, atraumatic with no lesions or palpable masses. Trachea-midline. Thyroid Gland Characteristics - normal size and consistency.  Eye Eyeball - Bilateral-Extraocular movements intact. Sclera/Conjunctiva - Bilateral-No scleral icterus.  Chest and Lung Exam Chest and lung exam reveals -quiet, even and easy respiratory effort with no use of accessory muscles and on auscultation, normal breath sounds, no adventitious sounds and normal vocal resonance. Inspection Chest Wall - Normal. Back - normal.  Breast Breast - Left-Symmetric, Non Tender, No Dimpling, No Inflammation, No Lumpectomy scars, No Mastectomy scars, No Peau d' Orange. Note: There is a 2 cm mass which is mobile laterally at the 3:30 position of the left breast. There is ecchymosis from the biopsy. There are no enlarged lymph nodes in the axilla. Breast - Right-Normal.  Cardiovascular Cardiovascular examination reveals -normal heart sounds, regular rate and rhythm with no murmurs and normal pedal pulses bilaterally.  Abdomen Inspection Inspection of the abdomen reveals - No Hernias. Skin - Scar - no surgical scars. Palpation/Percussion Palpation and Percussion of the abdomen reveal - Soft, Non Tender, No Rebound tenderness, No Rigidity  (guarding) and No hepatosplenomegaly. Auscultation Auscultation of the abdomen reveals - Bowel sounds normal.  Neurologic Neurologic evaluation reveals -alert and oriented x 3 with no impairment of recent or remote memory. Mental Status-Normal.  Musculoskeletal Normal Exam - Left-Upper Extremity Strength Normal and Lower Extremity Strength Normal. Normal Exam - Right-Upper Extremity Strength Normal and Lower Extremity Strength Normal.  Lymphatic Head & Neck  General Head & Neck Lymphatics: Bilateral - Description - Normal. Axillary  General Axillary Region: Bilateral - Description - Normal. Tenderness - Non Tender. Femoral & Inguinal  Generalized Femoral & Inguinal Lymphatics: Bilateral - Description - Normal. Tenderness - Non Tender.    Assessment & Plan   BREAST CANCER, LEFT (C50.912)  Impression: This is a patient with a triple negative left breast cancer. I discussed the diagnosis with the patient and her family. I gave them a copy of the pathology report. She is triple negative. We discussed her in the multidisciplinary breast cancer conference. She is a candidate for breast conservation. I discussed lumpectomy with postoperative radiation versus mastectomy with her. We discussed sentinel lymph node biopsy. We also discussed Port-A-Cath insertion. It is also been recommended that she be referred for genetic counseling. We discussed mastectomies bilaterally if she was genetically positive. After a long discussion, she was to go ahead and be scheduled for visits with both medical and radiation oncology as well as genetic testing. I will also tentatively schedule her for a left breast lumpectomy with sentinel lymph node biopsy and a Port-A-Cath insertion.

## 2017-01-22 ENCOUNTER — Encounter (HOSPITAL_BASED_OUTPATIENT_CLINIC_OR_DEPARTMENT_OTHER): Payer: Self-pay | Admitting: Certified Registered"

## 2017-01-22 ENCOUNTER — Ambulatory Visit (HOSPITAL_BASED_OUTPATIENT_CLINIC_OR_DEPARTMENT_OTHER): Payer: BLUE CROSS/BLUE SHIELD | Admitting: Certified Registered"

## 2017-01-22 ENCOUNTER — Ambulatory Visit (HOSPITAL_COMMUNITY): Payer: BLUE CROSS/BLUE SHIELD

## 2017-01-22 ENCOUNTER — Encounter (HOSPITAL_COMMUNITY)
Admission: RE | Admit: 2017-01-22 | Discharge: 2017-01-22 | Disposition: A | Discharge status: Home / Self Care | Payer: BLUE CROSS/BLUE SHIELD | Source: Ambulatory Visit | Attending: Surgery | Admitting: Surgery

## 2017-01-22 ENCOUNTER — Encounter (HOSPITAL_BASED_OUTPATIENT_CLINIC_OR_DEPARTMENT_OTHER)
Admission: RE | Disposition: A | Discharge status: Home / Self Care | Payer: Self-pay | Source: Ambulatory Visit | Attending: Surgery

## 2017-01-22 ENCOUNTER — Ambulatory Visit (HOSPITAL_BASED_OUTPATIENT_CLINIC_OR_DEPARTMENT_OTHER)
Admission: RE | Admit: 2017-01-22 | Discharge: 2017-01-22 | Disposition: A | Discharge status: Home / Self Care | Payer: BLUE CROSS/BLUE SHIELD | Source: Ambulatory Visit | Attending: Surgery | Admitting: Surgery

## 2017-01-22 ENCOUNTER — Ambulatory Visit
Admission: RE | Admit: 2017-01-22 | Discharge: 2017-01-22 | Disposition: A | Discharge status: Home / Self Care | Payer: BLUE CROSS/BLUE SHIELD | Source: Ambulatory Visit | Attending: Surgery | Admitting: Surgery

## 2017-01-22 DIAGNOSIS — Z8379 Family history of other diseases of the digestive system: Secondary | ICD-10-CM

## 2017-01-22 DIAGNOSIS — Z853 Personal history of malignant neoplasm of breast: Secondary | ICD-10-CM

## 2017-01-22 DIAGNOSIS — Z87891 Personal history of nicotine dependence: Secondary | ICD-10-CM | POA: Insufficient documentation

## 2017-01-22 DIAGNOSIS — Z95828 Presence of other vascular implants and grafts: Secondary | ICD-10-CM

## 2017-01-22 DIAGNOSIS — Z823 Family history of stroke: Secondary | ICD-10-CM

## 2017-01-22 DIAGNOSIS — D0512 Intraductal carcinoma in situ of left breast: Secondary | ICD-10-CM

## 2017-01-22 DIAGNOSIS — Z79899 Other long term (current) drug therapy: Secondary | ICD-10-CM | POA: Insufficient documentation

## 2017-01-22 DIAGNOSIS — K219 Gastro-esophageal reflux disease without esophagitis: Secondary | ICD-10-CM | POA: Insufficient documentation

## 2017-01-22 DIAGNOSIS — Z419 Encounter for procedure for purposes other than remedying health state, unspecified: Secondary | ICD-10-CM

## 2017-01-22 DIAGNOSIS — Z820 Family history of epilepsy and other diseases of the nervous system: Secondary | ICD-10-CM

## 2017-01-22 DIAGNOSIS — Z833 Family history of diabetes mellitus: Secondary | ICD-10-CM | POA: Insufficient documentation

## 2017-01-22 DIAGNOSIS — Z8249 Family history of ischemic heart disease and other diseases of the circulatory system: Secondary | ICD-10-CM

## 2017-01-22 DIAGNOSIS — Z8349 Family history of other endocrine, nutritional and metabolic diseases: Secondary | ICD-10-CM

## 2017-01-22 HISTORY — PX: PORTACATH PLACEMENT: SHX2246

## 2017-01-22 HISTORY — PX: BREAST LUMPECTOMY WITH RADIOACTIVE SEED AND SENTINEL LYMPH NODE BIOPSY: SHX6550

## 2017-01-22 LAB — POCT HEMOGLOBIN-HEMACUE: Hemoglobin: 14.3 g/dL (ref 12.0–15.0)

## 2017-01-22 SURGERY — BREAST LUMPECTOMY WITH RADIOACTIVE SEED AND SENTINEL LYMPH NODE BIOPSY
Anesthesia: General | Site: Chest | Laterality: Right

## 2017-01-22 MED ORDER — MIDAZOLAM HCL 2 MG/2ML IJ SOLN
1.0000 mg | INTRAMUSCULAR | Status: DC | PRN
  Administered 2017-01-22: 2 mg via INTRAVENOUS
  Administered 2017-01-22: 1 mg via INTRAVENOUS

## 2017-01-22 MED ORDER — LIDOCAINE HCL (CARDIAC) 20 MG/ML IV SOLN
INTRAVENOUS | Status: DC | PRN
  Administered 2017-01-22: 30 mg via INTRAVENOUS

## 2017-01-22 MED ORDER — BUPIVACAINE-EPINEPHRINE (PF) 0.5% -1:200000 IJ SOLN
INTRAMUSCULAR | Status: AC
  Filled 2017-01-22: qty 30

## 2017-01-22 MED ORDER — PROPOFOL 10 MG/ML IV BOLUS
INTRAVENOUS | Status: AC
  Filled 2017-01-22: qty 20

## 2017-01-22 MED ORDER — ONDANSETRON HCL 4 MG/2ML IJ SOLN
INTRAMUSCULAR | Status: AC
  Filled 2017-01-22: qty 2

## 2017-01-22 MED ORDER — LIDOCAINE 2% (20 MG/ML) 5 ML SYRINGE
INTRAMUSCULAR | Status: AC
  Filled 2017-01-22: qty 5

## 2017-01-22 MED ORDER — ACETAMINOPHEN 650 MG RE SUPP: 650.0000 mg | RECTAL | Status: DC | PRN

## 2017-01-22 MED ORDER — OXYCODONE HCL 5 MG PO TABS
5.0000 mg | ORAL_TABLET | ORAL | Status: DC | PRN
  Administered 2017-01-22: 5 mg via ORAL

## 2017-01-22 MED ORDER — FENTANYL CITRATE (PF) 100 MCG/2ML IJ SOLN
INTRAMUSCULAR | Status: AC
  Filled 2017-01-22: qty 2

## 2017-01-22 MED ORDER — ONDANSETRON HCL 4 MG/2ML IJ SOLN
INTRAMUSCULAR | Status: DC | PRN
  Administered 2017-01-22: 4 mg via INTRAVENOUS

## 2017-01-22 MED ORDER — MIDAZOLAM HCL 2 MG/2ML IJ SOLN
INTRAMUSCULAR | Status: AC
  Filled 2017-01-22: qty 2

## 2017-01-22 MED ORDER — CHLORHEXIDINE GLUCONATE CLOTH 2 % EX PADS: 6.0000 | MEDICATED_PAD | Freq: Once | CUTANEOUS | Status: DC

## 2017-01-22 MED ORDER — PROPOFOL 10 MG/ML IV BOLUS
INTRAVENOUS | Status: DC | PRN
  Administered 2017-01-22: 150 mg via INTRAVENOUS

## 2017-01-22 MED ORDER — HEPARIN (PORCINE) IN NACL 2-0.9 UNIT/ML-% IJ SOLN
INTRAMUSCULAR | Status: AC
  Filled 2017-01-22: qty 500

## 2017-01-22 MED ORDER — LACTATED RINGERS IV SOLN
INTRAVENOUS | Status: DC
  Administered 2017-01-22 (×2): via INTRAVENOUS

## 2017-01-22 MED ORDER — SCOPOLAMINE 1 MG/3DAYS TD PT72: 1.0000 | MEDICATED_PATCH | Freq: Once | TRANSDERMAL | Status: DC | PRN

## 2017-01-22 MED ORDER — PROMETHAZINE HCL 25 MG/ML IJ SOLN: 6.2500 mg | INTRAMUSCULAR | Status: DC | PRN

## 2017-01-22 MED ORDER — BUPIVACAINE-EPINEPHRINE (PF) 0.5% -1:200000 IJ SOLN
INTRAMUSCULAR | Status: DC | PRN
Start: 2017-01-22 — End: 2017-01-22
  Administered 2017-01-22: 30 mL

## 2017-01-22 MED ORDER — DEXAMETHASONE SODIUM PHOSPHATE 4 MG/ML IJ SOLN
INTRAMUSCULAR | Status: DC | PRN
  Administered 2017-01-22: 4 mg via INTRAVENOUS

## 2017-01-22 MED ORDER — HYDROMORPHONE HCL 1 MG/ML IJ SOLN: 0.2500 mg | INTRAMUSCULAR | Status: DC | PRN

## 2017-01-22 MED ORDER — OXYCODONE HCL 5 MG PO TABS
ORAL_TABLET | ORAL | Status: AC
  Filled 2017-01-22: qty 1

## 2017-01-22 MED ORDER — SODIUM CHLORIDE 0.9 % IV SOLN: 250.0000 mL | INTRAVENOUS | Status: DC | PRN

## 2017-01-22 MED ORDER — MORPHINE SULFATE (PF) 2 MG/ML IV SOLN: 1.0000 mg | INTRAVENOUS | Status: DC | PRN

## 2017-01-22 MED ORDER — METHYLENE BLUE 0.5 % INJ SOLN
INTRAVENOUS | Status: AC
  Filled 2017-01-22: qty 10

## 2017-01-22 MED ORDER — DEXAMETHASONE SODIUM PHOSPHATE 10 MG/ML IJ SOLN
INTRAMUSCULAR | Status: AC
  Filled 2017-01-22: qty 1

## 2017-01-22 MED ORDER — FENTANYL CITRATE (PF) 100 MCG/2ML IJ SOLN
50.0000 ug | INTRAMUSCULAR | Status: AC | PRN
  Administered 2017-01-22: 25 ug via INTRAVENOUS
  Administered 2017-01-22 (×3): 50 ug via INTRAVENOUS
  Administered 2017-01-22: 25 ug via INTRAVENOUS

## 2017-01-22 MED ORDER — HEPARIN SOD (PORK) LOCK FLUSH 100 UNIT/ML IV SOLN
INTRAVENOUS | Status: AC
Start: 2017-01-22 — End: 2017-01-22
  Filled 2017-01-22: qty 5

## 2017-01-22 MED ORDER — SODIUM CHLORIDE 0.9 % IJ SOLN
INTRAMUSCULAR | Status: AC
  Filled 2017-01-22: qty 10

## 2017-01-22 MED ORDER — SODIUM CHLORIDE 0.9% FLUSH: 3.0000 mL | INTRAVENOUS | Status: DC | PRN

## 2017-01-22 MED ORDER — CEFAZOLIN SODIUM-DEXTROSE 2-4 GM/100ML-% IV SOLN
INTRAVENOUS | Status: AC
  Filled 2017-01-22: qty 100

## 2017-01-22 MED ORDER — BUPIVACAINE HCL (PF) 0.25 % IJ SOLN
INTRAMUSCULAR | Status: DC | PRN
  Administered 2017-01-22: 18 mL

## 2017-01-22 MED ORDER — SODIUM CHLORIDE 0.9% FLUSH: 3.0000 mL | Freq: Two times a day (BID) | INTRAVENOUS | Status: DC

## 2017-01-22 MED ORDER — ACETAMINOPHEN 325 MG PO TABS: 650.0000 mg | ORAL_TABLET | ORAL | Status: DC | PRN

## 2017-01-22 MED ORDER — HYDROCODONE-ACETAMINOPHEN 5-325 MG PO TABS: 1.0000 | ORAL_TABLET | ORAL | 0 refills | Status: DC | PRN

## 2017-01-22 MED ORDER — BUPIVACAINE HCL (PF) 0.25 % IJ SOLN
INTRAMUSCULAR | Status: AC
  Filled 2017-01-22: qty 30

## 2017-01-22 MED ORDER — HEPARIN (PORCINE) IN NACL 2-0.9 UNIT/ML-% IJ SOLN
INTRAMUSCULAR | Status: DC | PRN
  Administered 2017-01-22: 1 via INTRAVENOUS

## 2017-01-22 MED ORDER — TECHNETIUM TC 99M SULFUR COLLOID FILTERED
1.0000 | Freq: Once | INTRAVENOUS | Status: AC | PRN
  Administered 2017-01-22: 1 via INTRADERMAL

## 2017-01-22 MED ORDER — HEPARIN SOD (PORK) LOCK FLUSH 100 UNIT/ML IV SOLN
INTRAVENOUS | Status: DC | PRN
  Administered 2017-01-22: 300 [IU] via INTRAVENOUS

## 2017-01-22 MED ORDER — CEFAZOLIN SODIUM-DEXTROSE 2-4 GM/100ML-% IV SOLN
2.0000 g | INTRAVENOUS | Status: AC
  Administered 2017-01-22: 2 g via INTRAVENOUS

## 2017-01-22 SURGICAL SUPPLY — 58 items
ADH SKN CLS APL DERMABOND .7 (GAUZE/BANDAGES/DRESSINGS) ×2
APPLIER CLIP 9.375 MED OPEN (MISCELLANEOUS) ×3
APR CLP MED 9.3 20 MLT OPN (MISCELLANEOUS) ×2
BAG DECANTER FOR FLEXI CONT (MISCELLANEOUS) ×3 IMPLANT
BLADE HEX COATED 2.75 (ELECTRODE) ×3 IMPLANT
BLADE SURG 15 STRL LF DISP TIS (BLADE) ×2 IMPLANT
BLADE SURG 15 STRL SS (BLADE) ×3
CANISTER SUCT 1200ML W/VALVE (MISCELLANEOUS) IMPLANT
CHLORAPREP W/TINT 26ML (MISCELLANEOUS) ×3 IMPLANT
CLIP APPLIE 9.375 MED OPEN (MISCELLANEOUS) ×2 IMPLANT
CLIP TI WIDE RED SMALL 6 (CLIP) IMPLANT
COVER BACK TABLE 60X90IN (DRAPES) ×3 IMPLANT
COVER MAYO STAND STRL (DRAPES) ×3 IMPLANT
COVER PROBE W GEL 5X96 (DRAPES) ×3 IMPLANT
DECANTER SPIKE VIAL GLASS SM (MISCELLANEOUS) IMPLANT
DERMABOND ADVANCED (GAUZE/BANDAGES/DRESSINGS) ×1
DERMABOND ADVANCED .7 DNX12 (GAUZE/BANDAGES/DRESSINGS) ×4 IMPLANT
DEVICE DUBIN W/COMP PLATE 8390 (MISCELLANEOUS) ×3 IMPLANT
DRAPE C-ARM 42X72 X-RAY (DRAPES) ×3 IMPLANT
DRAPE LAPAROSCOPIC ABDOMINAL (DRAPES) ×3 IMPLANT
DRAPE UTILITY XL STRL (DRAPES) ×3 IMPLANT
ELECT REM PT RETURN 9FT ADLT (ELECTROSURGICAL) ×3
ELECTRODE REM PT RTRN 9FT ADLT (ELECTROSURGICAL) ×2 IMPLANT
GLOVE BIOGEL PI IND STRL 7.5 (GLOVE) IMPLANT
GLOVE BIOGEL PI INDICATOR 7.5 (GLOVE) ×1
GLOVE SURG SIGNA 7.5 PF LTX (GLOVE) ×3 IMPLANT
GLOVE SURG SYN 7.5  E (GLOVE) ×1
GLOVE SURG SYN 7.5 E (GLOVE) ×2 IMPLANT
GLOVE SURG SYN 7.5 PF PI (GLOVE) IMPLANT
GOWN STRL REUS W/ TWL LRG LVL3 (GOWN DISPOSABLE) ×2 IMPLANT
GOWN STRL REUS W/ TWL XL LVL3 (GOWN DISPOSABLE) ×2 IMPLANT
GOWN STRL REUS W/TWL LRG LVL3 (GOWN DISPOSABLE) ×3
GOWN STRL REUS W/TWL XL LVL3 (GOWN DISPOSABLE) ×3
IV KIT MINILOC 20X1 SAFETY (NEEDLE) IMPLANT
KIT MARKER MARGIN INK (KITS) ×3 IMPLANT
KIT PORT POWER 8FR ISP CVUE (Catheter) ×1 IMPLANT
NDL HYPO 25X1 1.5 SAFETY (NEEDLE) ×2 IMPLANT
NDL SAFETY ECLIPSE 18X1.5 (NEEDLE) ×2 IMPLANT
NEEDLE HYPO 18GX1.5 SHARP (NEEDLE) ×3
NEEDLE HYPO 25X1 1.5 SAFETY (NEEDLE) ×3 IMPLANT
NS IRRIG 1000ML POUR BTL (IV SOLUTION) ×3 IMPLANT
PACK BASIN DAY SURGERY FS (CUSTOM PROCEDURE TRAY) ×3 IMPLANT
PENCIL BUTTON HOLSTER BLD 10FT (ELECTRODE) ×3 IMPLANT
SLEEVE SCD COMPRESS KNEE MED (MISCELLANEOUS) ×3 IMPLANT
SPONGE GAUZE 4X4 12PLY STER LF (GAUZE/BANDAGES/DRESSINGS) IMPLANT
SPONGE LAP 4X18 X RAY DECT (DISPOSABLE) ×1 IMPLANT
SUT MNCRL AB 4-0 PS2 18 (SUTURE) ×4 IMPLANT
SUT PROLENE 2 0 SH DA (SUTURE) ×3 IMPLANT
SUT SILK 2 0 SH (SUTURE) IMPLANT
SUT SILK 2 0 TIES 17X18 (SUTURE)
SUT SILK 2-0 18XBRD TIE BLK (SUTURE) IMPLANT
SUT VIC AB 3-0 SH 27 (SUTURE) ×3
SUT VIC AB 3-0 SH 27X BRD (SUTURE) ×2 IMPLANT
SYR CONTROL 10ML LL (SYRINGE) ×3 IMPLANT
TOWEL OR 17X24 6PK STRL BLUE (TOWEL DISPOSABLE) ×3 IMPLANT
TOWEL OR NON WOVEN STRL DISP B (DISPOSABLE) ×3 IMPLANT
TUBE CONNECTING 20X1/4 (TUBING) ×1 IMPLANT
YANKAUER SUCT BULB TIP NO VENT (SUCTIONS) ×2 IMPLANT

## 2017-01-22 NOTE — Progress Notes (Signed)
Emotional support during breast injections °

## 2017-01-22 NOTE — Anesthesia Preprocedure Evaluation (Addendum)
Anesthesia Evaluation  Patient identified by MRN, date of birth, ID band Patient awake    Reviewed: Allergy & Precautions, NPO status , Patient's Chart, lab work & pertinent test results  Airway Mallampati: II  TM Distance: >3 FB Neck ROM: Full    Dental  (+) Dental Advisory Given   Pulmonary former smoker,    breath sounds clear to auscultation       Cardiovascular negative cardio ROS   Rhythm:Regular Rate:Normal     Neuro/Psych negative neurological ROS     GI/Hepatic Neg liver ROS, GERD  Medicated,  Endo/Other  Morbid obesity  Renal/GU negative Renal ROS     Musculoskeletal   Abdominal   Peds  Hematology negative hematology ROS (+)   Anesthesia Other Findings   Reproductive/Obstetrics                            Anesthesia Physical Anesthesia Plan  ASA: II  Anesthesia Plan: General   Post-op Pain Management:  Regional for Post-op pain   Induction: Intravenous  Airway Management Planned: LMA  Additional Equipment:   Intra-op Plan:   Post-operative Plan: Extubation in OR  Informed Consent: I have reviewed the patients History and Physical, chart, labs and discussed the procedure including the risks, benefits and alternatives for the proposed anesthesia with the patient or authorized representative who has indicated his/her understanding and acceptance.   Dental advisory given  Plan Discussed with: CRNA  Anesthesia Plan Comments:         Anesthesia Quick Evaluation

## 2017-01-22 NOTE — Anesthesia Procedure Notes (Signed)
Anesthesia Regional Block:  Pectoralis block  Pre-Anesthetic Checklist: ,, timeout performed, Correct Patient, Correct Site, Correct Laterality, Correct Procedure, Correct Position, site marked, Risks and benefits discussed,  Surgical consent,  Pre-op evaluation,  At surgeon's request and post-op pain management  Laterality: Left  Prep: chloraprep       Needles:  Injection technique: Single-shot  Needle Type: Echogenic Needle     Needle Length: 9cm 9 cm Needle Gauge: 21 and 21 G    Additional Needles:  Procedures: ultrasound guided (picture in chart) Pectoralis block Narrative:  Start time: 01/22/2017 7:45 AM End time: 01/22/2017 7:52 AM Injection made incrementally with aspirations every 5 mL.  Performed by: Personally  Anesthesiologist: Suzette Battiest

## 2017-01-22 NOTE — Transfer of Care (Signed)
Immediate Anesthesia Transfer of Care Note  Patient: Shannon Logan  Procedure(s) Performed: Procedure(s): BREAST LUMPECTOMY WITH RADIOACTIVE SEED AND SENTINEL LYMPH NODE BIOPSY (Left) INSERTION PORT-A-CATH (Right)  Patient Location: PACU  Anesthesia Type:GA combined with regional for post-op pain  Level of Consciousness: awake, alert , oriented and patient cooperative  Airway & Oxygen Therapy: Patient Spontanous Breathing and Patient connected to face mask oxygen  Post-op Assessment: Report given to RN and Post -op Vital signs reviewed and stable  Post vital signs: Reviewed and stable  Last Vitals:  Vitals:   01/22/17 0755 01/22/17 0756  BP:  116/65  Pulse: 71 70  Resp: 15 15  Temp:      Last Pain:  Vitals:   01/22/17 0705  TempSrc: Oral  PainSc: 0-No pain      Patients Stated Pain Goal: 0 [id number])  Complications: No apparent anesthesia complications

## 2017-01-22 NOTE — Interval H&P Note (Signed)
History and Physical Interval Note: no change in H and P  01/22/2017 7:49 AM  Shannon Logan  has presented today for surgery, with the diagnosis of LEFT BREAST CANCER  The various methods of treatment have been discussed with the patient and family. After consideration of risks, benefits and other options for treatment, the patient has consented to  Procedure(s): BREAST LUMPECTOMY WITH RADIOACTIVE SEED AND SENTINEL LYMPH NODE BIOPSY (Left) INSERTION PORT-A-CATH (N/A) as a surgical intervention .  The patient's history has been reviewed, patient examined, no change in status, stable for surgery.  I have reviewed the patient's chart and labs.  Questions were answered to the patient's satisfaction.     [name] A

## 2017-01-22 NOTE — Anesthesia Procedure Notes (Signed)
Procedure Name: LMA Insertion Date/Time: 01/22/2017 8:36 AM Performed by: Eula Jaster D Pre-anesthesia Checklist: Patient identified, Emergency Drugs available, Suction available and Patient being monitored Patient Re-evaluated:Patient Re-evaluated prior to inductionOxygen Delivery Method: Circle system utilized Preoxygenation: Pre-oxygenation with 100% oxygen Intubation Type: IV induction Ventilation: Mask ventilation without difficulty LMA: LMA inserted LMA Size: 4.0 Number of attempts: 1 Airway Equipment and Method: Bite block Placement Confirmation: positive ETCO2 Tube secured with: Tape Dental Injury: Teeth and Oropharynx as per pre-operative assessment

## 2017-01-22 NOTE — Op Note (Signed)
BREAST LUMPECTOMY WITH RADIOACTIVE SEED AND SENTINEL LYMPH NODE BIOPSY, INSERTION PORT-Logan-CATH  Procedure Note  Shannon Logan 01/22/2017   Pre-op Diagnosis: LEFT BREAST CANCER     Post-op Diagnosis: same  Procedure(s): BREAST LUMPECTOMY WITH RADIOACTIVE SEED AND DEEP LEFT AXILLARY SENTINEL LYMPH NODE BIOPSY INSERTION PORT-Logan-CATH (RIGHT SUBCLAVIAN VEIN)  Surgeon(s): Coralie Keens, MD  Anesthesia: General  Staff:  Circulator: Lynelle Doctor, RN Radiology Technologist: Ludger Nutting Scrub Person: Romero Liner, [place] Circulator Assistant: Izora Ribas, RN  Estimated Blood Loss: Minimal               Specimens: SENT TO PATH  Indications: This is Logan 51 year old female with Logan stage I left breast cancer in the lower outer quadrant. The decision has been made to proceed with Logan lumpectomy and sentinel lymph node biopsy left side. She is trouble negative so Logan Port-Logan-Cath is also being inserted for postoperative chemotherapy  Procedure: The patient was identified in the preop holding area. Logan left pectoral nerve block was performed by anesthesia. Radioactive isotope was then injected around the areola of the left breast by the radiation technologist. The patient was then taken to the operating room. She was placed supine on the operating table and general seizures induced. Her bilateral chest and breasts and axilla were then prepped and draped in usual sterile fashion. I anesthetized the skin of the right chest with Marcaine. I then used the introducer needle to cannulate the left subclavian vein. Logan guidewire was then placed through the needle and into the central venous system. Placement was confirmed with fluoroscopy. I then made an incision with Logan scalpel introduction site and created Logan pocket for the port insertion. The Port-Logan-Cath was brought as Logan fill-in flushed. I passed the venous dilator and introducer sheath over the wire into the central venous system. The wire and dilator were  removed. The catheter was attached to the port and secured and cut an appropriately. I then placed the port into the pocket and fed the catheter down introducer sheath. The sheath was then peeled away leaving the catheter and central venous system. I accessed the port and good flush and return were demonstrated. Fluoroscopy confirmed placement in the superior vena cava. I then sewed the port to the chest wall the 3-0 Prolene suture. I then injected the port with concentrated heparin solution. I then closed the subjacent tissue with 3-0 Vicryl sutures and closed the skin with Logan running 4-0 Monocryl.  Next Logan turned my attention toward the left breast cancer. I identified an area in the lower outer quadrant of the left breast with the neoprobe. I anesthetized the skin at the lateral inframammary ridge with Marcaine. I then created an incision with Logan scalpel. Using Logan lighter retractors are then tunneled toward the radioactive seed. I then performed Logan wide lip acne going down to the chest wall with the aid of the lighted retractors and neoprobe. Was the lumpectomy specimen was removed I can easily palpate the mass. I marked all margins marker pain. X-ray on specimen was performed and confirmed that the radioactive seed improve his marker and mass were in the lumpectomy specimen. This was then sent to pathology for evaluation. Hemostasis was then achieved with the cautery. I then again used the neoprobe to identify an area of increased uptake in the left axilla. I anesthetized the skin with Marcaine. I made an incision with the scalpel. I didn't extend to the axillary tissue. I date dissected down into the  deep axilla and with the neoprobe identified at least 2 sentinel lymph nodes. These were excised with Logan separate note in mass with electrocautery.  The lymph nodes were then sent to pathology for evaluation. I checked the nodal basin with the neoprobe and found no other increased uptake. I then placed surgical clips  at the lumpectomy site. I then closed both incisions with 3-0 Vicryl sutures and 4-0 Monocryl sutures. Skin glue was then applied to all incisions. The patient was then placed in Logan breast binder. The patient tolerated procedure well. All the counts were correct at the end of the procedure. The patient was then extubated in the operating room and taken in Logan stable condition to the recovery room.          Shannon Logan   Date: 01/22/2017  Time: 9:47 AM

## 2017-01-22 NOTE — Progress Notes (Signed)
Assisted Dr. Rob Fitzgerald with left, ultrasound guided, pectoralis block. Side rails up, monitors on throughout procedure. See vital signs in flow sheet. Tolerated Procedure well. 

## 2017-01-22 NOTE — Discharge Instructions (Signed)
[place] Office Phone Number [phone]  BREAST BIOPSY/ PARTIAL MASTECTOMY: POST OP INSTRUCTIONS  Always review your discharge instruction sheet given to you by the facility where your surgery was performed.  IF YOU HAVE DISABILITY OR FAMILY LEAVE FORMS, YOU MUST BRING THEM TO THE OFFICE FOR PROCESSING.  DO NOT GIVE THEM TO YOUR DOCTOR.  1. A prescription for pain medication may be given to you upon discharge.  Take your pain medication as prescribed, if needed.  If narcotic pain medicine is not needed, then you may take acetaminophen (Tylenol) or ibuprofen (Advil) as needed. 2. Take your usually prescribed medications unless otherwise directed 3. If you need a refill on your pain medication, please contact your pharmacy.  They will contact our office to request authorization.  Prescriptions will not be filled after 5pm or on week-ends. 4. You should eat very light the first 24 hours after surgery, such as soup, crackers, pudding, etc.  Resume your normal diet the day after surgery. 5. Most patients will experience some swelling and bruising in the breast.  Ice packs and a good support bra will help.  Swelling and bruising can take several days to resolve.  6. It is common to experience some constipation if taking pain medication after surgery.  Increasing fluid intake and taking a stool softener will usually help or prevent this problem from occurring.  A mild laxative (Milk of Magnesia or Miralax) should be taken according to package directions if there are no bowel movements after 48 hours. 7. Unless discharge instructions indicate otherwise, you may remove your bandages 24-48 hours after surgery, and you may shower at that time.  You may have steri-strips (small skin tapes) in place directly over the incision.  These strips should be left on the skin for 7-10 days.  If your surgeon used skin glue on the incision, you may shower in 24 hours.  The glue will flake off over the  next 2-3 weeks.  Any sutures or staples will be removed at the office during your follow-up visit. 8. ACTIVITIES:  You may resume regular daily activities (gradually increasing) beginning the next day.  Wearing a good support bra or sports bra minimizes pain and swelling.  You may have sexual intercourse when it is comfortable. a. You may drive when you no longer are taking prescription pain medication, you can comfortably wear a seatbelt, and you can safely maneuver your car and apply brakes. b. RETURN TO WORK:  ______________________________________________________________________________________ 9. You should see your doctor in the office for a follow-up appointment approximately two weeks after your surgery.  Your doctors nurse will typically make your follow-up appointment when she calls you with your pathology report.  Expect your pathology report 2-3 business days after your surgery.  You may call to check if you do not hear from [place] after three days. 10. OTHER INSTRUCTIONS: ___OK TO SHOWER TOMORROW.  WEAR BINDER FOR CONFORT 11. ICE PACK AND IBUPROFEN ALSO FOR PAIN 12. ____________________________________________________________________________________________ _____________________________________________________________________________________________________________________________________ _____________________________________________________________________________________________________________________________________ _____________________________________________________________________________________________________________________________________  WHEN TO CALL YOUR DOCTOR: 1. Fever over 101.0 2. Nausea and/or vomiting. 3. Extreme swelling or bruising. 4. Continued bleeding from incision. 5. Increased pain, redness, or drainage from the incision.  The clinic staff is available to answer your questions during regular business hours.  Please dont hesitate to call and ask to speak to  one of the nurses for clinical concerns.  If you have a medical emergency, go to the nearest emergency room or call 911.  A surgeon from  [facility] is always on call at the hospital.  For further questions, please visit [email or web address]     Post Anesthesia Home Care Instructions  Activity: Get plenty of rest for the remainder of the day. A responsible adult should stay with you for 24 hours following the procedure.  For the next 24 hours, DO NOT: -Drive a car -Paediatric nurse -Drink alcoholic beverages -Take any medication unless instructed by your physician -Make any legal decisions or sign important papers.  Meals: Start with liquid foods such as gelatin or soup. Progress to regular foods as tolerated. Avoid greasy, spicy, heavy foods. If nausea and/or vomiting occur, drink only clear liquids until the nausea and/or vomiting subsides. Call your physician if vomiting continues.  Special Instructions/Symptoms: Your throat may feel dry or sore from the anesthesia or the breathing tube placed in your throat during surgery. If this causes discomfort, gargle with warm salt water. The discomfort should disappear within 24 hours.  If you had a scopolamine patch placed behind your ear for the management of post- operative nausea and/or vomiting:  1. The medication in the patch is effective for 72 hours, after which it should be removed.  Wrap patch in a tissue and discard in the trash. Wash hands thoroughly with soap and water. 2. You may remove the patch earlier than 72 hours if you experience unpleasant side effects which may include dry mouth, dizziness or visual disturbances. 3. Avoid touching the patch. Wash your hands with soap and water after contact with the patch.    Call your surgeon if you experience:   1.  Fever over 101.0. 2.  Inability to urinate. 3.  Nausea and/or vomiting. 4.  Extreme swelling or bruising at the surgical site. 5.  Continued  bleeding from the incision. 6.  Increased pain, redness or drainage from the incision. 7.  Problems related to your pain medication. 8.  Any problems and/or concerns

## 2017-01-22 NOTE — Anesthesia Postprocedure Evaluation (Signed)
Anesthesia Post Note  Patient: MEZTLI NARINE  Procedure(s) Performed: Procedure(s) (LRB): BREAST LUMPECTOMY WITH RADIOACTIVE SEED AND SENTINEL LYMPH NODE BIOPSY (Left) INSERTION PORT-A-CATH (Right)  Patient location during evaluation: PACU Anesthesia Type: General Level of consciousness: awake and alert Pain management: pain level controlled Vital Signs Assessment: post-procedure vital signs reviewed and stable Respiratory status: spontaneous breathing, nonlabored ventilation, respiratory function stable and patient connected to nasal cannula oxygen Cardiovascular status: blood pressure returned to baseline and stable Postop Assessment: no signs of nausea or vomiting Anesthetic complications: no       Last Vitals:  Vitals:   01/22/17 1125 01/22/17 1219  BP:  138/77  Pulse: 77 73  Resp: 15 16  Temp:  36.9 C    Last Pain:  Vitals:   01/22/17 1219  TempSrc: Oral  PainSc: 3                  Tiajuana Amass

## 2017-01-23 ENCOUNTER — Encounter (HOSPITAL_BASED_OUTPATIENT_CLINIC_OR_DEPARTMENT_OTHER): Payer: Self-pay | Admitting: Surgery

## 2017-01-24 ENCOUNTER — Other Ambulatory Visit (HOSPITAL_COMMUNITY): Payer: Self-pay | Admitting: Oncology

## 2017-01-28 ENCOUNTER — Other Ambulatory Visit: Payer: Self-pay | Admitting: Surgery

## 2017-01-28 ENCOUNTER — Encounter (HOSPITAL_BASED_OUTPATIENT_CLINIC_OR_DEPARTMENT_OTHER): Payer: Self-pay | Admitting: *Deleted

## 2017-01-29 NOTE — H&P (Signed)
Shannon Logan is an 51 y.o. female.   Chief Complaint: breast cancer HPI: she is one week s/p radioactive seed localized left breast lumpectomy with sentinel node biopsy and port a cath insertion.  The final pathology showed a positive margins and one of two sentinel nodes positive for malignancy.  She tolerated the procedure well and has no other complaints.  Past Medical History:  Diagnosis Date  . Cancer ([place]) 11/22/2016   Breast   . Family history of breast cancer   . GERD (gastroesophageal reflux disease)     Past Surgical History:  Procedure Laterality Date  . BREAST LUMPECTOMY WITH RADIOACTIVE SEED AND SENTINEL LYMPH NODE BIOPSY Left 01/22/2017   Procedure: BREAST LUMPECTOMY WITH RADIOACTIVE SEED AND SENTINEL LYMPH NODE BIOPSY;  Surgeon: Coralie Keens, MD;  Location: [facility];  Service: General;  Laterality: Left;  . COLONOSCOPY N/A 10/26/2015   Procedure: COLONOSCOPY;  Surgeon: Rogene Houston, MD;  [facility];  Service: Endoscopy;  Laterality: N/A;  . DILATION AND CURETTAGE OF UTERUS    . ESOPHAGOGASTRODUODENOSCOPY N/A 10/26/2015   Procedure: ESOPHAGOGASTRODUODENOSCOPY (EGD);  Surgeon: Rogene Houston, MD;  [facility];  Service: Endoscopy;  Laterality: N/A;  1:00  . PORTACATH PLACEMENT Right 01/22/2017   Procedure: INSERTION PORT-A-CATH;  Surgeon: Coralie Keens, MD;  Location: [facility];  Service: General;  Laterality: Right;  . URETHRAL DILATION      Family History  Problem Relation Age of Onset  . Hypertension Mother   . Hyperlipidemia Mother   . Heart disease Father   . Hypertension Father   . Diabetes Father   . Aneurysm Maternal Grandmother   . Stroke Maternal Grandfather   . Heart disease Paternal Grandfather   . Breast cancer Other     PGF's sister  . Breast cancer Other     PGF's sister  . Breast cancer Other     PGF's aunt (great, great aunt)  . Breast cancer Other     PGM's mother   Social  History:  reports that she quit smoking about 8 years ago. She has never used smokeless tobacco. She reports that she drinks alcohol. She reports that she does not use drugs.  Allergies:  Allergies  Allergen Reactions  . Asa [Aspirin]     Ate a lot of baby aspirin when she was small, mom was told she was allergic, not sure the reaction  . Prednisone     Numbness in face and arms  . Sulfa Antibiotics     No prescriptions prior to admission.    No results found for this or any previous visit (from the past 48 hour(s)). No results found.  Review of Systems  All other systems reviewed and are negative.   Height 5\' 4"  (1.626 m), weight 93.9 kg (207 lb). Physical Exam  Constitutional: She is oriented to person, place, and time. She appears well-developed and well-nourished. No distress.  HENT:  Head: Normocephalic and atraumatic.  Eyes: Pupils are equal, round, and reactive to light.  Neck: Normal range of motion.  Cardiovascular: Normal rate, regular rhythm and normal heart sounds.   Respiratory: Breath sounds normal.  GI: Soft.  Musculoskeletal: Normal range of motion. She exhibits no tenderness.  Neurological: She is alert and oriented to person, place, and time.  Skin: Skin is warm. She is not diaphoretic. No erythema.  Incisions from left breast and left axilla are healing well  Psychiatric: Her behavior is normal.  Assessment/Plan Left breast cancer s/p lumpectomy and sentinel node biopsy with positive margins and a positive lymph node for breast cancer.  I discussed the pathology with Shannon Logan and her oncologist is aware.  Re-excision of the lumpectomy site to try and achieve local control and complete axillary dissection especially given her triple negative status. I discussed the risks which include but are not limited to bleeding, infection, injury to surrounding structures including nerves and blood vessels in the axilla, need for a drain, arm swelling, need for  further surgery if margins are still positive, cardiopulmonary issues, DVT, etc.  She understands and wishes to proceed with surgery.  Harl Bowie, MD 01/29/2017, 7:57 PM

## 2017-01-29 NOTE — Progress Notes (Signed)
Boost drink given with instructions to complete by 0415, pt verbalized understanding.

## 2017-01-30 ENCOUNTER — Ambulatory Visit (HOSPITAL_BASED_OUTPATIENT_CLINIC_OR_DEPARTMENT_OTHER): Payer: BLUE CROSS/BLUE SHIELD | Admitting: Anesthesiology

## 2017-01-30 ENCOUNTER — Ambulatory Visit (HOSPITAL_BASED_OUTPATIENT_CLINIC_OR_DEPARTMENT_OTHER)
Admission: RE | Admit: 2017-01-30 | Discharge: 2017-01-31 | Disposition: A | Discharge status: Home / Self Care | Payer: BLUE CROSS/BLUE SHIELD | Source: Ambulatory Visit | Attending: Surgery | Admitting: Surgery

## 2017-01-30 ENCOUNTER — Encounter (HOSPITAL_BASED_OUTPATIENT_CLINIC_OR_DEPARTMENT_OTHER)
Admission: RE | Disposition: A | Discharge status: Home / Self Care | Payer: Self-pay | Source: Ambulatory Visit | Attending: Surgery

## 2017-01-30 ENCOUNTER — Encounter (HOSPITAL_BASED_OUTPATIENT_CLINIC_OR_DEPARTMENT_OTHER): Payer: Self-pay | Admitting: Anesthesiology

## 2017-01-30 DIAGNOSIS — Z886 Allergy status to analgesic agent status: Secondary | ICD-10-CM | POA: Insufficient documentation

## 2017-01-30 DIAGNOSIS — E669 Obesity, unspecified: Secondary | ICD-10-CM | POA: Diagnosis not present

## 2017-01-30 DIAGNOSIS — C773 Secondary and unspecified malignant neoplasm of axilla and upper limb lymph nodes: Secondary | ICD-10-CM | POA: Diagnosis present

## 2017-01-30 DIAGNOSIS — Z8349 Family history of other endocrine, nutritional and metabolic diseases: Secondary | ICD-10-CM | POA: Insufficient documentation

## 2017-01-30 DIAGNOSIS — D0512 Intraductal carcinoma in situ of left breast: Secondary | ICD-10-CM | POA: Diagnosis not present

## 2017-01-30 DIAGNOSIS — L7634 Postprocedural seroma of skin and subcutaneous tissue following other procedure: Secondary | ICD-10-CM | POA: Diagnosis not present

## 2017-01-30 DIAGNOSIS — Z9889 Other specified postprocedural states: Secondary | ICD-10-CM | POA: Insufficient documentation

## 2017-01-30 DIAGNOSIS — Z803 Family history of malignant neoplasm of breast: Secondary | ICD-10-CM | POA: Insufficient documentation

## 2017-01-30 DIAGNOSIS — Z87891 Personal history of nicotine dependence: Secondary | ICD-10-CM | POA: Diagnosis not present

## 2017-01-30 DIAGNOSIS — C50912 Malignant neoplasm of unspecified site of left female breast: Secondary | ICD-10-CM | POA: Diagnosis present

## 2017-01-30 DIAGNOSIS — Z882 Allergy status to sulfonamides status: Secondary | ICD-10-CM | POA: Insufficient documentation

## 2017-01-30 DIAGNOSIS — Z6835 Body mass index (BMI) 35.0-35.9, adult: Secondary | ICD-10-CM | POA: Diagnosis not present

## 2017-01-30 DIAGNOSIS — Z8249 Family history of ischemic heart disease and other diseases of the circulatory system: Secondary | ICD-10-CM | POA: Insufficient documentation

## 2017-01-30 DIAGNOSIS — Z833 Family history of diabetes mellitus: Secondary | ICD-10-CM | POA: Insufficient documentation

## 2017-01-30 DIAGNOSIS — Y838 Other surgical procedures as the cause of abnormal reaction of the patient, or of later complication, without mention of misadventure at the time of the procedure: Secondary | ICD-10-CM | POA: Diagnosis not present

## 2017-01-30 DIAGNOSIS — Z823 Family history of stroke: Secondary | ICD-10-CM | POA: Insufficient documentation

## 2017-01-30 DIAGNOSIS — Z888 Allergy status to other drugs, medicaments and biological substances status: Secondary | ICD-10-CM | POA: Insufficient documentation

## 2017-01-30 HISTORY — PX: BREAST LUMPECTOMY WITH AXILLARY LYMPH NODE DISSECTION: SHX5756

## 2017-01-30 SURGERY — BREAST LUMPECTOMY WITH AXILLARY LYMPH NODE DISSECTION
Anesthesia: Regional | Site: Breast | Laterality: Left

## 2017-01-30 MED ORDER — FENTANYL CITRATE (PF) 100 MCG/2ML IJ SOLN
INTRAMUSCULAR | Status: AC
  Filled 2017-01-30: qty 2

## 2017-01-30 MED ORDER — CHLORHEXIDINE GLUCONATE CLOTH 2 % EX PADS: 6.0000 | MEDICATED_PAD | Freq: Once | CUTANEOUS | Status: DC

## 2017-01-30 MED ORDER — GABAPENTIN 600 MG PO TABS
300.0000 mg | ORAL_TABLET | Freq: Once | ORAL | Status: AC
  Administered 2017-01-30: 300 mg via ORAL

## 2017-01-30 MED ORDER — MIDAZOLAM HCL 2 MG/2ML IJ SOLN
1.0000 mg | INTRAMUSCULAR | Status: DC | PRN
  Administered 2017-01-30: 2 mg via INTRAVENOUS

## 2017-01-30 MED ORDER — LIDOCAINE 2% (20 MG/ML) 5 ML SYRINGE
INTRAMUSCULAR | Status: AC
  Filled 2017-01-30: qty 5

## 2017-01-30 MED ORDER — DIPHENHYDRAMINE HCL 50 MG/ML IJ SOLN
25.0000 mg | Freq: Four times a day (QID) | INTRAMUSCULAR | Status: DC | PRN
Start: 2017-01-30 — End: 2017-01-31

## 2017-01-30 MED ORDER — FENTANYL CITRATE (PF) 100 MCG/2ML IJ SOLN: 25.0000 ug | INTRAMUSCULAR | Status: DC | PRN

## 2017-01-30 MED ORDER — METOCLOPRAMIDE HCL 5 MG/ML IJ SOLN: 10.0000 mg | Freq: Once | INTRAMUSCULAR | Status: DC | PRN

## 2017-01-30 MED ORDER — HYDROMORPHONE HCL 1 MG/ML IJ SOLN
INTRAMUSCULAR | Status: AC
  Filled 2017-01-30: qty 1

## 2017-01-30 MED ORDER — OXYCODONE HCL 5 MG PO TABS
5.0000 mg | ORAL_TABLET | ORAL | Status: DC | PRN
  Administered 2017-01-30: 5 mg via ORAL
  Administered 2017-01-30: 10 mg via ORAL
  Administered 2017-01-30 – 2017-01-31 (×3): 5 mg via ORAL
  Filled 2017-01-30 (×3): qty 1
  Filled 2017-01-30: qty 2
  Filled 2017-01-30: qty 1

## 2017-01-30 MED ORDER — ONDANSETRON HCL 4 MG/2ML IJ SOLN
INTRAMUSCULAR | Status: AC
  Filled 2017-01-30: qty 2

## 2017-01-30 MED ORDER — MIDAZOLAM HCL 2 MG/2ML IJ SOLN
INTRAMUSCULAR | Status: AC
  Filled 2017-01-30: qty 2

## 2017-01-30 MED ORDER — CEFAZOLIN SODIUM-DEXTROSE 2-4 GM/100ML-% IV SOLN
2.0000 g | INTRAVENOUS | Status: AC
  Administered 2017-01-30: 2 g via INTRAVENOUS

## 2017-01-30 MED ORDER — MORPHINE SULFATE (PF) 2 MG/ML IV SOLN: 1.0000 mg | INTRAVENOUS | Status: DC | PRN

## 2017-01-30 MED ORDER — PROPOFOL 10 MG/ML IV BOLUS
INTRAVENOUS | Status: AC
  Filled 2017-01-30: qty 20

## 2017-01-30 MED ORDER — BUPIVACAINE-EPINEPHRINE (PF) 0.5% -1:200000 IJ SOLN
INTRAMUSCULAR | Status: DC | PRN
  Administered 2017-01-30: 30 mL via PERINEURAL

## 2017-01-30 MED ORDER — PROPOFOL 10 MG/ML IV BOLUS
INTRAVENOUS | Status: DC | PRN
  Administered 2017-01-30: 180 mg via INTRAVENOUS
  Administered 2017-01-30: 10 mg via INTRAVENOUS

## 2017-01-30 MED ORDER — ACETAMINOPHEN 500 MG PO TABS
1000.0000 mg | ORAL_TABLET | Freq: Once | ORAL | Status: AC
  Administered 2017-01-30: 1000 mg via ORAL

## 2017-01-30 MED ORDER — GABAPENTIN 300 MG PO CAPS
ORAL_CAPSULE | ORAL | Status: AC
  Filled 2017-01-30: qty 1

## 2017-01-30 MED ORDER — LIDOCAINE 2% (20 MG/ML) 5 ML SYRINGE
INTRAMUSCULAR | Status: DC | PRN
  Administered 2017-01-30: 80 mg via INTRAVENOUS

## 2017-01-30 MED ORDER — ENOXAPARIN SODIUM 40 MG/0.4ML ~~LOC~~ SOLN
40.0000 mg | SUBCUTANEOUS | Status: DC
  Administered 2017-01-31: 40 mg via SUBCUTANEOUS
  Filled 2017-01-30: qty 0.4

## 2017-01-30 MED ORDER — POTASSIUM CHLORIDE IN NACL 20-0.9 MEQ/L-% IV SOLN
INTRAVENOUS | Status: DC
Start: 2017-01-30 — End: 2017-01-31
  Administered 2017-01-30: 10:00:00 via INTRAVENOUS
  Filled 2017-01-30: qty 1000

## 2017-01-30 MED ORDER — DIPHENHYDRAMINE HCL 25 MG PO CAPS: 25.0000 mg | ORAL_CAPSULE | Freq: Four times a day (QID) | ORAL | Status: DC | PRN

## 2017-01-30 MED ORDER — MEPERIDINE HCL 25 MG/ML IJ SOLN: 6.2500 mg | INTRAMUSCULAR | Status: DC | PRN

## 2017-01-30 MED ORDER — ACETAMINOPHEN 650 MG RE SUPP: 650.0000 mg | Freq: Four times a day (QID) | RECTAL | Status: DC | PRN

## 2017-01-30 MED ORDER — CEFAZOLIN SODIUM-DEXTROSE 2-4 GM/100ML-% IV SOLN
INTRAVENOUS | Status: AC
  Filled 2017-01-30: qty 100

## 2017-01-30 MED ORDER — ONDANSETRON 4 MG PO TBDP: 4.0000 mg | ORAL_TABLET | Freq: Four times a day (QID) | ORAL | Status: DC | PRN

## 2017-01-30 MED ORDER — HYDROCODONE-ACETAMINOPHEN 7.5-325 MG PO TABS: 1.0000 | ORAL_TABLET | Freq: Once | ORAL | Status: DC | PRN

## 2017-01-30 MED ORDER — ONDANSETRON HCL 4 MG/2ML IJ SOLN
INTRAMUSCULAR | Status: DC | PRN
  Administered 2017-01-30: 4 mg via INTRAVENOUS

## 2017-01-30 MED ORDER — ONDANSETRON HCL 4 MG/2ML IJ SOLN: 4.0000 mg | Freq: Four times a day (QID) | INTRAMUSCULAR | Status: DC | PRN

## 2017-01-30 MED ORDER — SCOPOLAMINE 1 MG/3DAYS TD PT72: 1.0000 | MEDICATED_PATCH | Freq: Once | TRANSDERMAL | Status: DC | PRN

## 2017-01-30 MED ORDER — FENTANYL CITRATE (PF) 100 MCG/2ML IJ SOLN
50.0000 ug | INTRAMUSCULAR | Status: AC | PRN
  Administered 2017-01-30 (×4): 25 ug via INTRAVENOUS
  Administered 2017-01-30: 100 ug via INTRAVENOUS

## 2017-01-30 MED ORDER — ACETAMINOPHEN 500 MG PO TABS
ORAL_TABLET | ORAL | Status: AC
  Filled 2017-01-30: qty 2

## 2017-01-30 MED ORDER — BUPIVACAINE-EPINEPHRINE 0.5% -1:200000 IJ SOLN
INTRAMUSCULAR | Status: DC | PRN
  Administered 2017-01-30: 30 mL

## 2017-01-30 MED ORDER — ACETAMINOPHEN 160 MG/5ML PO SOLN: 960.0000 mg | Freq: Once | ORAL | Status: DC

## 2017-01-30 MED ORDER — LACTATED RINGERS IV SOLN
INTRAVENOUS | Status: DC
  Administered 2017-01-30 (×2): via INTRAVENOUS

## 2017-01-30 MED ORDER — ACETAMINOPHEN 325 MG PO TABS: 650.0000 mg | ORAL_TABLET | Freq: Four times a day (QID) | ORAL | Status: DC | PRN

## 2017-01-30 MED ORDER — METOCLOPRAMIDE HCL 5 MG/ML IJ SOLN
INTRAMUSCULAR | Status: DC | PRN
  Administered 2017-01-30: 10 mg via INTRAVENOUS

## 2017-01-30 MED ORDER — HYDROMORPHONE HCL 1 MG/ML IJ SOLN
0.2500 mg | INTRAMUSCULAR | Status: DC | PRN
  Administered 2017-01-30 (×4): 0.5 mg via INTRAVENOUS
  Administered 2017-01-31: 0.25 mg via INTRAVENOUS

## 2017-01-30 SURGICAL SUPPLY — 46 items
ADH SKN CLS APL DERMABOND .7 (GAUZE/BANDAGES/DRESSINGS) ×2
APPLIER CLIP 9.375 MED OPEN (MISCELLANEOUS) ×4
APR CLP MED 9.3 20 MLT OPN (MISCELLANEOUS) ×2
BLADE HEX COATED 2.75 (ELECTRODE) ×2 IMPLANT
BLADE SURG 15 STRL LF DISP TIS (BLADE) ×1 IMPLANT
BLADE SURG 15 STRL SS (BLADE) ×2
CANISTER SUCT 1200ML W/VALVE (MISCELLANEOUS) ×2 IMPLANT
CHLORAPREP W/TINT 26ML (MISCELLANEOUS) ×2 IMPLANT
CLIP APPLIE 9.375 MED OPEN (MISCELLANEOUS) ×1 IMPLANT
COVER BACK TABLE 60X90IN (DRAPES) ×2 IMPLANT
COVER MAYO STAND STRL (DRAPES) ×2 IMPLANT
DECANTER SPIKE VIAL GLASS SM (MISCELLANEOUS) IMPLANT
DERMABOND ADVANCED (GAUZE/BANDAGES/DRESSINGS) ×2
DERMABOND ADVANCED .7 DNX12 (GAUZE/BANDAGES/DRESSINGS) ×2 IMPLANT
DRAIN CHANNEL 19F RND (DRAIN) ×2 IMPLANT
DRAIN PENROSE 1/4X12 LTX STRL (WOUND CARE) IMPLANT
DRAPE LAPAROSCOPIC ABDOMINAL (DRAPES) IMPLANT
DRAPE LAPAROTOMY 100X72 PEDS (DRAPES) ×2 IMPLANT
DRAPE UTILITY XL STRL (DRAPES) ×2 IMPLANT
ELECT REM PT RETURN 9FT ADLT (ELECTROSURGICAL) ×2
ELECTRODE REM PT RTRN 9FT ADLT (ELECTROSURGICAL) ×1 IMPLANT
EVACUATOR SILICONE 100CC (DRAIN) ×2 IMPLANT
GLOVE SURG SIGNA 7.5 PF LTX (GLOVE) ×2 IMPLANT
GOWN STRL REUS W/ TWL LRG LVL3 (GOWN DISPOSABLE) ×1 IMPLANT
GOWN STRL REUS W/ TWL XL LVL3 (GOWN DISPOSABLE) ×1 IMPLANT
GOWN STRL REUS W/TWL LRG LVL3 (GOWN DISPOSABLE) ×2
GOWN STRL REUS W/TWL XL LVL3 (GOWN DISPOSABLE) ×2
KIT MARKER MARGIN INK (KITS) ×2 IMPLANT
NEEDLE HYPO 25X1 1.5 SAFETY (NEEDLE) ×2 IMPLANT
NS IRRIG 1000ML POUR BTL (IV SOLUTION) ×2 IMPLANT
PACK BASIN DAY SURGERY FS (CUSTOM PROCEDURE TRAY) ×2 IMPLANT
PENCIL BUTTON HOLSTER BLD 10FT (ELECTRODE) ×2 IMPLANT
PIN SAFETY STERILE (MISCELLANEOUS) ×2 IMPLANT
SLEEVE SCD COMPRESS KNEE MED (MISCELLANEOUS) ×2 IMPLANT
SPONGE LAP 4X18 X RAY DECT (DISPOSABLE) ×5 IMPLANT
STAPLER VISISTAT 35W (STAPLE) IMPLANT
SUT ETHILON 2 0 FS 18 (SUTURE) ×2 IMPLANT
SUT MNCRL AB 4-0 PS2 18 (SUTURE) ×3 IMPLANT
SUT VIC AB 3-0 SH 27 (SUTURE) ×4
SUT VIC AB 3-0 SH 27X BRD (SUTURE) ×2 IMPLANT
SYR BULB 3OZ (MISCELLANEOUS) ×2 IMPLANT
SYR CONTROL 10ML LL (SYRINGE) ×2 IMPLANT
TOWEL OR 17X24 6PK STRL BLUE (TOWEL DISPOSABLE) ×2 IMPLANT
TOWEL OR NON WOVEN STRL DISP B (DISPOSABLE) ×2 IMPLANT
TUBE CONNECTING 20X1/4 (TUBING) ×2 IMPLANT
YANKAUER SUCT BULB TIP NO VENT (SUCTIONS) ×2 IMPLANT

## 2017-01-30 NOTE — Anesthesia Postprocedure Evaluation (Signed)
Anesthesia Post Note  Patient: SEONA KERNS  Procedure(s) Performed: Procedure(s) (LRB): RE-EXCISION OF LEFT BREAST LUMPECTOMY AND LEFT AXILLARY LYMPH NODE DISSECTION (Left)  Patient location during evaluation: PACU Anesthesia Type: Regional Level of consciousness: awake and alert and oriented Pain management: pain level controlled Vital Signs Assessment: post-procedure vital signs reviewed and stable Respiratory status: spontaneous breathing, nonlabored ventilation and respiratory function stable Cardiovascular status: blood pressure returned to baseline and stable Postop Assessment: no signs of nausea or vomiting Anesthetic complications: no       Last Vitals:  Vitals:   01/30/17 0930 01/30/17 0945  BP: 118/74 119/72  Pulse: 77 73  Resp: 15 14  Temp:      Last Pain:  Vitals:   01/30/17 0945  TempSrc:   PainSc: 2                  Ronte Parker A.

## 2017-01-30 NOTE — Transfer of Care (Signed)
Immediate Anesthesia Transfer of Care Note  Patient: Shannon Logan  Procedure(s) Performed: Procedure(s): RE-EXCISION OF LEFT BREAST LUMPECTOMY AND LEFT AXILLARY LYMPH NODE DISSECTION (Left)  Patient Location: PACU  Anesthesia Type:GA combined with regional for post-op pain  Level of Consciousness: awake, alert  and oriented  Airway & Oxygen Therapy: Patient Spontanous Breathing and Patient connected to face mask oxygen  Post-op Assessment: Report given to RN and Post -op Vital signs reviewed and stable  Post vital signs: Reviewed and stable  Last Vitals:  Vitals:   01/30/17 0715 01/30/17 0716  BP: 121/63   Pulse: 85 85  Resp: 16 18  Temp:      Last Pain:  Vitals:   01/30/17 0634  TempSrc: Oral  PainSc: 1       Patients Stated Pain Goal: 1 ([id number])  Complications: No apparent anesthesia complications

## 2017-01-30 NOTE — Interval H&P Note (Signed)
History and Physical Interval Note: no change in H and P  01/30/2017 7:08 AM  Shannon Logan  has presented today for surgery, with the diagnosis of LEFT BREAST CANCER  The various methods of treatment have been discussed with the patient and family. After consideration of risks, benefits and other options for treatment, the patient has consented to  Procedure(s): RE-EXCISION OF LEFT BREAST LUMPECTOMY AND LEFT AXILLARY LYMPH NODE DISSECTION (Left) as a surgical intervention .  The patient's history has been reviewed, patient examined, no change in status, stable for surgery.  I have reviewed the patient's chart and labs.  Questions were answered to the patient's satisfaction.     Erubiel Manasco A

## 2017-01-30 NOTE — Progress Notes (Signed)
Assisted Dr. Royce Macadamia with left, ultrasound guided, pectoralis block. Side rails up, monitors on throughout procedure. See vital signs in flow sheet. Tolerated Procedure well.

## 2017-01-30 NOTE — Anesthesia Procedure Notes (Signed)
Procedure Name: LMA Insertion Date/Time: 01/30/2017 7:24 AM Performed by: Maryella Shivers Pre-anesthesia Checklist: Patient identified, Emergency Drugs available, Suction available and Patient being monitored Patient Re-evaluated:Patient Re-evaluated prior to inductionOxygen Delivery Method: Circle system utilized Preoxygenation: Pre-oxygenation with 100% oxygen Intubation Type: IV induction Ventilation: Mask ventilation without difficulty LMA: LMA inserted LMA Size: 4.0 Number of attempts: 1 Airway Equipment and Method: Bite block Placement Confirmation: positive ETCO2 Tube secured with: Tape Dental Injury: Teeth and Oropharynx as per pre-operative assessment

## 2017-01-30 NOTE — Anesthesia Procedure Notes (Addendum)
Anesthesia Regional Block:  Pectoralis block  Pre-Anesthetic Checklist: ,, timeout performed, Correct Patient, Correct Site, Correct Laterality, Correct Procedure, Correct Position, site marked, Risks and benefits discussed,  Surgical consent,  Pre-op evaluation,  At surgeon's request and post-op pain management  Laterality: Left  Prep: chloraprep       Needles:  Injection technique: Single-shot  Needle Type: Echogenic Stimulator Needle     Needle Length: 9cm 9 cm Needle Gauge: 21 and 21 G    Additional Needles:  Procedures: ultrasound guided (picture in chart) Pectoralis block Narrative:  Start time: 01/30/2017 7:07 AM End time: 01/30/2017 7:12 AM Injection made incrementally with aspirations every 5 mL.  Performed by: Personally  Anesthesiologist: Josephine Igo  Additional Notes: Timeout performed. Patient sedated. Relevant anatomy ID'd using [place]. Incremental 70ml injection with frequent aspiration. Patient tolerated procedure well.

## 2017-01-30 NOTE — Op Note (Signed)
RE-EXCISION OF LEFT BREAST LUMPECTOMY AND LEFT AXILLARY LYMPH NODE DISSECTION  Procedure Note  Shannon Logan 01/30/2017   Pre-op Diagnosis: LEFT BREAST CANCER     Post-op Diagnosis: same  Procedure(s): RE-EXCISION OF LEFT BREAST LUMPECTOMY SITE ANTERIOR AND MEDIAL MARGIN  LEFT COMPLETE DEEP AXILLARY LYMPH NODE DISSECTION  Surgeon(s): Shannon Keens, MD  Anesthesia: General  Staff:  Circulator: Leonidas Romberg, RN Scrub Person: Carron Curie, RN  Estimated Blood Loss: less than 100 mL               Specimens: sent to path  Indications: This is a 51 year old female with triple negative left breast cancer. She has already had a left breast lumpectomy with sentinel lymph node biopsy and Port-A-Cath insertion. The final pathology showed some positive margins of the anterior and medial lumpectomy specimen. One of 2 sentinel lymph nodes was positive for cancer as well. The decision was made to proceed with reexcision of lumpectomy site and completion left axillary deep node dissection.  Procedure: The patient was brought to the operating room and identified as the correct patient. She was placed supine on the operating room table and general seizures induced. Her left breast neck solar the prepped and draped in usual sterile fashion. Her previous incision on the inframammary ridge was anesthetized Marcaine. I then opened up the previous incision with a scalpel. I took this to the lumpectomy site and aspirated the seroma. I had already taken the lumpectomy site all the way down to chest wall. This point with cautery uptake another anterior margin stain just underneath the dermis and then worked medially taking this down to the chest wall as well.. I appeared to take a large amount of tissue.  I then marked to the anterior and medial new margins with marker pain. The specimen wasn't sent to pathology for evaluation. I then achieved hemostasis with cautery. A sizes skin the old incision in  the axilla further with Marcaine. I made a larger axillary incision with scalpel. I then took this down to the deep axillary tissue with electrocautery. I then performed a completion left axillary dissection. I took out the complete deep lymph node package in the left axilla. Identified both the thoracodorsal and long thoracic nerves during the dissection and spare these. The patient one large palpable node and no other abnormalities. Hazy identified the axillary vein and its branches is well. Several small bridging veins and lymphatics were clipped with surgical clips. With the aid of the cautery was able to completely remove the rest of the axillary contents consisting of the fat and lymph nodes. This was then sent separately to pathology for evaluation. I again evaluated the deep axillary tissue both visually and manually to palpate no other lymph nodes. I then irrigated the wound with saline. Hemostasis appeared to be achieved. I made a separate skin incision and placed a 19 [place] Blake drain into the axilla. This was in place with a nylon suture. I then anesthetized both wounds further with Marcaine. Hemostasis appeared to be achieved. I then closed both incisions with interrupted 3-0 Vicryl sutures and running 4-0 Monocryl sutures. Skin glue was then applied. The patient tolerated the procedure well. All the counts were correct at the end of the procedure. Patient was in a stated in the operating room and taken in a stable condition to the recovery room.    [name]   Date: 01/30/2017  Time: 8:37 AM

## 2017-01-30 NOTE — Anesthesia Preprocedure Evaluation (Addendum)
Anesthesia Evaluation  Patient identified by MRN, date of birth, ID band Patient awake    Reviewed: Allergy & Precautions, NPO status , Patient's Chart, lab work & pertinent test results  Airway Mallampati: II  TM Distance: >3 FB Neck ROM: Full    Dental  (+) Dental Advisory Given   Pulmonary former smoker,    breath sounds clear to auscultation       Cardiovascular negative cardio ROS   Rhythm:Regular Rate:Normal     Neuro/Psych negative neurological ROS  negative psych ROS   GI/Hepatic Neg liver ROS, GERD  Medicated and Controlled,  Endo/Other  Morbid obesityHypertriglyceridemia Obesity Left Breast Ca- S/P lumpectomy with sentinal node bx with inadequate margins  Renal/GU negative Renal ROS  negative genitourinary   Musculoskeletal negative musculoskeletal ROS (+)   Abdominal (+) + obese,   Peds  Hematology negative hematology ROS (+)   Anesthesia Other Findings   Reproductive/Obstetrics                            Anesthesia Physical  Anesthesia Plan  ASA: II  Anesthesia Plan: General and Regional   Post-op Pain Management:  Regional for Post-op pain   Induction: Intravenous  Airway Management Planned: LMA  Additional Equipment:   Intra-op Plan:   Post-operative Plan: Extubation in OR  Informed Consent: I have reviewed the patients History and Physical, chart, labs and discussed the procedure including the risks, benefits and alternatives for the proposed anesthesia with the patient or authorized representative who has indicated his/her understanding and acceptance.   Dental advisory given  Plan Discussed with: CRNA, Anesthesiologist and Surgeon  Anesthesia Plan Comments:        Anesthesia Quick Evaluation

## 2017-01-31 ENCOUNTER — Ambulatory Visit: Payer: BLUE CROSS/BLUE SHIELD | Admitting: Pediatrics

## 2017-01-31 ENCOUNTER — Encounter (HOSPITAL_BASED_OUTPATIENT_CLINIC_OR_DEPARTMENT_OTHER): Payer: Self-pay | Admitting: Surgery

## 2017-01-31 DIAGNOSIS — D0512 Intraductal carcinoma in situ of left breast: Secondary | ICD-10-CM | POA: Diagnosis not present

## 2017-01-31 MED ORDER — HYDROMORPHONE HCL 1 MG/ML IJ SOLN
INTRAMUSCULAR | Status: AC
  Filled 2017-01-31: qty 1

## 2017-01-31 MED ORDER — OXYCODONE HCL 5 MG PO TABS: 5.0000 mg | ORAL_TABLET | ORAL | 0 refills | Status: DC | PRN

## 2017-01-31 NOTE — Progress Notes (Signed)
1 Day Post-Op  Subjective: Doing well comfortable  Objective: Vital signs in last 24 hours: Temp:  [97.5 F (36.4 C)-99.7 F (37.6 C)] 97.7 F (36.5 C) (02/16 0530) Pulse Rate:  [71-106] 79 (02/16 0530) Resp:  [12-20] 18 (02/16 0530) BP: (107-155)/(66-82) 107/68 (02/16 0530) SpO2:  [94 %-100 %] 97 % (02/16 0530)    Intake/Output from previous day: 02/15 0701 - 02/16 0700 In: 3255 [P.O.:1755; I.V.:1500] Out: 2262.5 [Urine:2100; Drains:112.5; Blood:50] Intake/Output this shift: No intake/output data recorded.  Exam:  Breast and axilla incisions clean Drain serous  Lab Results:  No results for input(s): WBC, HGB, HCT, PLT in the last 72 hours. BMET No results for input(s): NA, K, CL, CO2, GLUCOSE, BUN, CREATININE, CALCIUM in the last 72 hours. PT/INR No results for input(s): LABPROT, INR in the last 72 hours. ABG No results for input(s): PHART, HCO3 in the last 72 hours.  Invalid input(s): PCO2, PO2  Studies/Results: No results found.  Anti-infectives: Anti-infectives    Start     Dose/Rate Route Frequency Ordered Stop   01/30/17 0612  ceFAZolin (ANCEF) IVPB 2g/100 mL premix     2 g 200 mL/hr over 30 Minutes Intravenous On call to O.R. 01/30/17 0612 01/30/17 0737      Assessment/Plan: s/p Procedure(s): RE-EXCISION OF LEFT BREAST LUMPECTOMY AND LEFT AXILLARY LYMPH NODE DISSECTION (Left)  Discharge home  LOS: 0 days    Mattison Stuckey A 01/31/2017

## 2017-01-31 NOTE — Discharge Instructions (Signed)
About my Jackson-Pratt Bulb Drain  What is a Jackson-Pratt bulb? A Jackson-Pratt is a soft, round device used to collect drainage. It is connected to a long, thin drainage catheter, which is held in place by one or two small stiches near your surgical incision site. When the bulb is squeezed, it forms a vacuum, forcing the drainage to empty into the bulb.  Emptying the Jackson-Pratt bulb- To empty the bulb: 1. Release the plug on the top of the bulb. 2. Pour the bulb's contents into a measuring container which your nurse will provide. 3. Record the time emptied and amount of drainage. Empty the drain(s) as often as your     doctor or nurse recommends.  Date                  Time                    Amount (Drain 1)                 Amount (Drain 2)  _____________________________________________________________________  _____________________________________________________________________  _____________________________________________________________________  _____________________________________________________________________  _____________________________________________________________________  _____________________________________________________________________  _____________________________________________________________________  _____________________________________________________________________  Squeezing the Jackson-Pratt Bulb- To squeeze the bulb: 1. Make sure the plug at the top of the bulb is open. 2. Squeeze the bulb tightly in your fist. You will hear air squeezing from the bulb. 3. Replace the plug while the bulb is squeezed. 4. Use a safety pin to attach the bulb to your clothing. This will keep the catheter from     pulling at the bulb insertion site.  When to call your doctor- Call your doctor if:  Drain site becomes red, swollen or hot.  You have a fever greater than 101 degrees F.  There is oozing at the drain site.  Drain falls out (apply a guaze  bandage over the drain hole and secure it with tape).  Drainage increases daily not related to activity patterns. (You will usually have more drainage when you are active than when you are resting.)  Drainage has a bad odor.  OK TO SHOWER TOMORROW MIRALAX FOR CONSTIPATION IBUPROFEN ALSO FOR PAIN

## 2017-01-31 NOTE — Discharge Summary (Signed)
Physician Discharge Summary  Patient ID: Shannon Logan MRN: [id number] DOB/AGE: 52-Apr-1967 52 y.o.  Admit date: 01/30/2017 Discharge date: 01/31/2017  Admission Diagnoses:  Discharge Diagnoses:  Active Problems:   Breast cancer [facility])   Discharged Condition: good  Hospital Course: UNEVENTFUL POST OP RECOVERY.  DISCHARGED POD#1  Consults: None  Significant Diagnostic Studies:   Treatments: surgery: re-excision left breast cancer and complete left axillary lymph node dissection  Discharge Exam: Blood pressure 107/68, pulse 79, temperature 97.7 F (36.5 C), resp. rate 18, height 5\' 4"  (1.626 m), weight 94.4 kg (208 lb 2 oz), SpO2 97 %. General appearance: alert, cooperative and no distress Resp: clear to auscultation bilaterally Cardio: regular rate and rhythm, S1, S2 normal, no murmur, click, rub or gallop Incision/Wound:incisions clean, drain serous  Disposition: 01-Home or Self Care   Allergies as of 01/31/2017      Reactions   Asa [aspirin]    Ate a lot of baby aspirin when she was small, mom was told she was allergic, not sure the reaction   Prednisone    Numbness in face and arms   Sulfa Antibiotics       Medication List    TAKE these medications   acetaminophen 500 MG tablet Commonly known as:  TYLENOL Take 500 mg by mouth every 6 (six) hours as needed.   HYDROcodone-acetaminophen 5-325 MG tablet Commonly known as:  NORCO/VICODIN Take 1-2 tablets by mouth every 4 (four) hours as needed for moderate pain or severe pain.   ibuprofen 200 MG tablet Commonly known as:  ADVIL,MOTRIN Take 200 mg by mouth every 6 (six) hours as needed.   oxyCODONE 5 MG immediate release tablet Commonly known as:  Oxy IR/ROXICODONE Take 1-2 tablets (5-10 mg total) by mouth every 4 (four) hours as needed for moderate pain or severe pain.   pantoprazole 40 MG tablet Commonly known as:  PROTONIX Take 1 tablet (40 mg total) by mouth daily before breakfast.   UNABLE TO FIND Med  Name: probiotic      Follow-up Information    Charish Schroepfer A, MD. Schedule an appointment as soon as possible for a visit on 02/07/2017.   Specialty:  General Surgery Why:  ask for Chemira --possible drain removal Contact information: [place]  [id number] [phone]           Signed: Harl Bowie 01/31/2017, 7:36 AM

## 2017-02-05 ENCOUNTER — Encounter (HOSPITAL_COMMUNITY): Payer: BLUE CROSS/BLUE SHIELD | Attending: Hematology & Oncology | Admitting: Oncology

## 2017-02-05 ENCOUNTER — Encounter (HOSPITAL_COMMUNITY): Payer: Self-pay | Admitting: Emergency Medicine

## 2017-02-05 ENCOUNTER — Encounter (HOSPITAL_COMMUNITY): Payer: Self-pay | Admitting: Oncology

## 2017-02-05 VITALS — BP 131/73 | HR 71 | Temp 98.1°F | Resp 16 | Ht 65.0 in | Wt 210.0 lb

## 2017-02-05 DIAGNOSIS — C50512 Malignant neoplasm of lower-outer quadrant of left female breast: Secondary | ICD-10-CM

## 2017-02-05 DIAGNOSIS — Z171 Estrogen receptor negative status [ER-]: Secondary | ICD-10-CM

## 2017-02-05 DIAGNOSIS — C773 Secondary and unspecified malignant neoplasm of axilla and upper limb lymph nodes: Secondary | ICD-10-CM

## 2017-02-05 NOTE — Progress Notes (Signed)
Shannon Maize, MD [place] [address]  Malignant neoplasm of lower-outer quadrant of left breast of female, estrogen receptor negative (Shannon Logan) - Plan: NM Cardiac Muga Rest, CBC with Differential, Comprehensive metabolic panel, CBC with Differential, Comprehensive metabolic panel, CT Abdomen Pelvis W Contrast, CT Chest W Contrast  CURRENT THERAPY: Planning for adjuvant chemotherapy   INTERVAL HISTORY: Shannon Logan 51 y.o. female returns for followup of Stage IIIA (pT2pN1AM0) left breast cancer in the lower-outer quadrant, ER/PR/HER2 NEGATIVE, S/P left lumpectomy with positive margins by Dr. Ninfa Linden on 01/22/2017 with one positive left axillary node with positive for metastatic disease resulting in a re-excision on 01/30/2017 and left axillary lymph node dissection with invasive component focally 0.1-0.2 cm away from anterior margin but inked margin surface is negative and 0/6 benign lymph nodes.    Breast cancer of lower-outer quadrant of left female breast (Shannon Logan)   11/19/2016 Mammogram    In the lateral aspect of the left breast, posterior depth, there is an irregular spiculated mass measuring approximately 1.8 cm. There are a few pleomorphic calcifications in a linear distribution within the mass and extending slightly anterior to the mass. All together the mass and calcifications span approximately 2.5 cm.      11/21/2016 Procedure    Breast, left, needle core biopsy, 3:30 o'clock, 8 cm fn      11/22/2016 Pathology Results    Breast, left, needle core biopsy, 3:30 o'clock, 8 cm fn - INVASIVE DUCTAL CARCINOMA, GRADE 2.      01/03/2017 Initial Diagnosis    Breast cancer of lower-outer quadrant of left female breast (Shannon Logan)     01/22/2017 Procedure    Left breast lumpectomy and left axillary sentinel node biopsy by Dr. Ninfa Linden      01/22/2017 Procedure    Port placed by Dr. Ninfa Linden      01/23/2017 Pathology Results    1. Breast, lumpectomy, Left - INVASIVE  GRADE 3 DUCTAL CARCINOMA, SPANNING 2.2 CM IN GREATEST DIMENSION. 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 - LYMPH/VASCULAR INVASION IS PRESENT. - ANTERIOR / MEDIAL MARGIN IS FOCALLY POSITIVE FOR INVASIVE DUCTAL CARCINOMA AND ANTERIOR MARGIN IS BROADLY POSITIVE FOR INVASIVE DUCTAL CARCINOMA. - DUCTAL CARCINOMA IN SITU IS FOCALLY LESS THAN 0.1 CM TO POSTERIOR MARGIN. - OTHER MARGINS ARE NEGATIVE. - SEE ONCOLOGY TEMPLATE. 2. Lymph node, sentinel, biopsy, Left axillary - ONE BENIGN LYMPH NODE WITH NO TUMOR SEEN (0/1). 3. Lymph node, sentinel, biopsy, Left axillary - ONE LYMPH NODE POSITIVE FOR METASTATIC DUCTAL CARCINOMA (1/1).      01/30/2017 Procedure    Breast, excision, Left, new anterior and medial margins and Lymph nodes, regional resection, Left axillary contents      01/31/2017 Pathology Results    Diagnosis 1. Breast, excision, Left, new anterior and medial margins - RESIDUAL INVASIVE DUCTAL CARCINOMA AND EXTENSIVE RESIDUAL DUCTAL CARCINOMA IN SITU. - INVASIVE DUCTAL CARCINOMA IS FOCALLY 0.1 TO 0.2 CM AWAY FROM ANTERIOR MARGIN BUT INKED MARGINAL SURFACE IS NEGATIVE. - DUCTAL CARCINOMA IN SITU IS FOCALLY LESS THAN 0.1 CM AWAY FROM ANTERIOR MARGIN AND FOCALLY LESS THAN 0.1 CM AWAY FROM MEDIAL ASPECT OF ANTERIOR MARGIN. - SEE COMMENT. 2. Lymph nodes, regional resection, Left axillary contents - SIX BENIGN LYMPH NODES WITH NO TUMOR SEEN (0/6).       Patient is here today for further medical oncology recommendations.  She was seen prior to her primary treatment with left lumpectomy.  Surgery  was performed by Dr. Ninfa Linden.  Pathology from that initial surgery demonstrated positive margins and therefore she recently underwent reresection by Dr. Ninfa Linden ascertaining negative margins.  Left axillary lymph node was positive at initial surgery and therefore at time of re-resection left axillary lymph node dissection was performed and 6 lymph nodes were negative.     She still has drain in place and she notes that the drain is only putting out 30 cc per day or so.  She anticipates the drain will be removed this Friday by Dr. Ninfa Linden at her follow-up appointment.  She denies any complaints today an oncology perspective.  She is here to learn more about treatment moving forward.  Given her triple negative disease, she is a candidate for adjuvant systemic chemotherapy.  This was discussed previously at her last encounter.  I reviewed the risks, benefits, alternatives, and side effects of systemic chemotherapy consisting of Adriamycin/Cytoxan/paclitaxel.  I reviewed basic side effects including nausea, vomiting, diarrhea, constipation, decrease in blood counts, increased risk of infection, anaphylaxis, change in renal function, change in liver function tests, change in cardiac function, peripheral neuropathy, fluid retention, alopecia and death.   Review of Systems  Constitutional: Negative.  Negative for chills, fever and weight loss.  HENT: Negative.   Eyes: Negative.   Respiratory: Negative.  Negative for cough.   Cardiovascular: Negative.  Negative for chest pain.  Gastrointestinal: Negative.  Negative for blood in stool, constipation, diarrhea, melena, nausea and vomiting.  Genitourinary: Negative.   Musculoskeletal: Negative.   Skin: Negative.   Neurological: Negative.  Negative for weakness.  Endo/Heme/Allergies: Negative.   Psychiatric/Behavioral: Negative.     Past Medical History:  Diagnosis Date  . Breast cancer of lower-outer quadrant of left female breast ([place]) 01/03/2017  . Cancer ([place]) 11/22/2016   Breast   . Family history of breast cancer   . GERD (gastroesophageal reflux disease)     Past Surgical History:  Procedure Laterality Date  . BREAST LUMPECTOMY WITH AXILLARY LYMPH NODE DISSECTION Left 01/30/2017   Procedure: RE-EXCISION OF LEFT BREAST LUMPECTOMY AND LEFT AXILLARY LYMPH NODE DISSECTION;  Surgeon: Coralie Keens, MD;   Location: [facility];  Service: General;  Laterality: Left;  . BREAST LUMPECTOMY WITH RADIOACTIVE SEED AND SENTINEL LYMPH NODE BIOPSY Left 01/22/2017   Procedure: BREAST LUMPECTOMY WITH RADIOACTIVE SEED AND SENTINEL LYMPH NODE BIOPSY;  Surgeon: Coralie Keens, MD;  Location: [facility];  Service: General;  Laterality: Left;  . COLONOSCOPY N/A 10/26/2015   Procedure: COLONOSCOPY;  Surgeon: Rogene Houston, MD;  [facility];  Service: Endoscopy;  Laterality: N/A;  . DILATION AND CURETTAGE OF UTERUS    . ESOPHAGOGASTRODUODENOSCOPY N/A 10/26/2015   Procedure: ESOPHAGOGASTRODUODENOSCOPY (EGD);  Surgeon: Rogene Houston, MD;  [facility];  Service: Endoscopy;  Laterality: N/A;  1:00  . PORTACATH PLACEMENT Right 01/22/2017   Procedure: INSERTION PORT-A-CATH;  Surgeon: Coralie Keens, MD;  Location: [facility];  Service: General;  Laterality: Right;  . URETHRAL DILATION      Family History  Problem Relation Age of Onset  . Hypertension Mother   . Hyperlipidemia Mother   . Heart disease Father   . Hypertension Father   . Diabetes Father   . Aneurysm Maternal Grandmother   . Stroke Maternal Grandfather   . Heart disease Paternal Grandfather   . Breast cancer Other     PGF's sister  . Breast cancer Other     PGF's sister  .  Breast cancer Other     PGF's aunt (great, great aunt)  . Breast cancer Other     PGM's mother    Social History   Social History  . Marital status: Married    Spouse name: [name]  . Number of children: 3  . Years of education: N/A   Social History Main Topics  . Smoking status: Former Smoker    Quit date: 07/23/2008  . Smokeless tobacco: Never Used     Comment: quit smoking 07/23/2008 after smoking 30 yrs. (1/2 pack a day)  . Alcohol use 0.0 oz/week     Comment: rare  . Drug use: No  . Sexual activity: Yes   Other Topics Concern  . None   Social History Narrative  . None     PHYSICAL  EXAMINATION  ECOG PERFORMANCE STATUS: 0 - Asymptomatic  Vitals:   02/05/17 1052  BP: 131/73  Pulse: 71  Resp: 16  Temp: 98.1 F (36.7 C)    GENERAL:alert, no distress, well nourished, well developed, comfortable, cooperative, obese, smiling and unaccompanied SKIN: skin color, texture, turgor are normal, no rashes or significant lesions HEAD: Normocephalic, No masses, lesions, tenderness or abnormalities EYES: normal EARS: External ears normal OROPHARYNX:lips, buccal mucosa, and tongue normal and mucous membranes are moist  NECK: supple, trachea midline LYMPH:  not examined BREAST:not examined LUNGS: clear to auscultation  HEART: regular rate & rhythm ABDOMEN:abdomen soft, obese and normal bowel sounds BACK: Back symmetric, no curvature. EXTREMITIES:less then 2 second capillary refill, no joint deformities, effusion, or inflammation, no skin discoloration, no cyanosis  NEURO: alert & oriented x 3 with fluent speech, no focal motor/sensory deficits, gait normal   LABORATORY DATA: CBC    Component Value Date/Time   WBC 8.2 07/02/2013 1523   RBC 4.5 07/02/2013 1523   HGB 14.3 01/22/2017 0718   HCT 40.2 07/02/2013 1523   MCV 90.2 07/02/2013 1523   MCH 32.2 (A) 07/02/2013 1523   MCHC 35.7 (A) 07/02/2013 1523      Chemistry      Component Value Date/Time   NA 144 10/25/2016 0910   K 5.0 10/25/2016 0910   CL 102 10/25/2016 0910   CO2 23 10/25/2016 0910   BUN 13 10/25/2016 0910   CREATININE 0.89 10/25/2016 0910   CREATININE 0.78 07/02/2013 1448      Component Value Date/Time   CALCIUM 9.5 10/25/2016 0910   ALKPHOS 126 (H) 10/25/2016 0910   AST 16 10/25/2016 0910   ALT 19 10/25/2016 0910   BILITOT 0.9 10/25/2016 0910        PENDING LABS:   RADIOGRAPHIC STUDIES:  Nm Sentinel Node Inj-no Rpt (breast)  Result Date: 01/31/2017 There is no Radiologist interpretation  for this exam.  Mm Breast Surgical Specimen  Result Date: 01/22/2017 CLINICAL DATA:   Patient status post lumpectomy today after earlier radioactive seed localization. EXAM: SPECIMEN RADIOGRAPH OF THE LEFT BREAST COMPARISON:  Previous exam(s). FINDINGS: Status post excision of the left breast. The radioactive seed and biopsy marker clip are present, completely intact, and were marked for pathology. The positions of the radioactive seed and biopsy marker clip within the specimen were discussed with the OR staff during the procedure. IMPRESSION: Specimen radiograph of the left breast. Electronically Signed   By: Franki Cabot M.D.   On: 01/22/2017 09:27   Dg Chest Port 1 View  Result Date: 01/22/2017 CLINICAL DATA:  Status post Port-A-Cath insertion on the right today. EXAM: PORTABLE CHEST 1 VIEW COMPARISON:  None. FINDINGS: Right subclavian approach Port-A-Cath is in place with the tip projecting in the mid superior vena cava. Lung volumes are low with crowding of the bronchovascular structures. No pneumothorax. Heart size is upper normal. IMPRESSION: Port-A-Cath tip projects in the mid superior vena cava. Negative for pneumothorax. Electronically Signed   By: Inge Rise M.D.   On: 01/22/2017 11:11   Dg Fluoro Guide Cv Line-no Report  Result Date: 01/22/2017 Fluoroscopy was utilized by the requesting physician.  No radiographic interpretation.   Mm Lt Radioactive Seed Loc Mammo Guide  Result Date: 01/21/2017 CLINICAL DATA:  Patient presents for radioactive seed localization prior to lumpectomy for known invasive ductal carcinoma in the left breast. EXAM: MAMMOGRAPHIC GUIDED RADIOACTIVE SEED LOCALIZATION OF THE LEFT BREAST COMPARISON:  Previous exam(s). FINDINGS: Patient presents for radioactive seed localization prior to lumpectomy. I met with the patient and we discussed the procedure of seed localization including benefits and alternatives. We discussed the high likelihood of a successful procedure. We discussed the risks of the procedure including infection, bleeding, tissue injury  and further surgery. We discussed the low dose of radioactivity involved in the procedure. Informed, written consent was given. The usual time-out protocol was performed immediately prior to the procedure. Using mammographic guidance, sterile technique, 1% lidocaine and an I-125 radioactive seed, the coil shaped clip in the lower outer quadrant of the left breast was localized using a lateral approach. The follow-up mammogram images confirm the seed in the expected location and were marked for Dr. Ninfa Linden. Follow-up survey of the patient confirms presence of the radioactive seed. Order number of I-125 seed:  [id number]. Total activity:  4.259 millicuries  Reference Date: 01/10/2017 The patient tolerated the procedure well and was released from the [facility]. She was given instructions regarding seed removal. IMPRESSION: Radioactive seed localization of the left breast. No apparent complications. Electronically Signed   By: Nolon Nations M.D.   On: 01/21/2017 15:12     PATHOLOGY:    ASSESSMENT AND PLAN:  Breast cancer of lower-outer quadrant of left female breast ([place]) Stage IIIA (pT2pN1AM0) left breast cancer in the lower-outer quadrant, ER/PR/HER2 NEGATIVE, S/P left lumpectomy with positive margins by Dr. Ninfa Linden on 01/22/2017 with one positive left axillary node with positive for metastatic disease resulting in a re-excision on 01/30/2017 and left axillary lymph node dissection with invasive component focally 0.1-0.2 cm away from anterior margin but inked margin surface is negative and 0/6 benign lymph nodes.  I personally reviewed and went over pathology results with the patient.  I reviewed her staging.  Based upon the 8th ed [place] system, she is a Stage IIIA (due to her ER/PR NEGATIVITY).  Based upon the old staging system, 7th ed [facility] she is a Stage IIB.  I've discussed adjuvant chemotherapy recommendation: AC followed by weekly paclitaxel.    I reviewed the risks, benefits,  alternatives, and side effects of chemotherapy including, but not limited to nausea/ vomiting, risk of infection from low WBC count, fatigue due to chemo or anemia, bruising or bleeding due to low platelets, mouth sores, loss/ change in taste and decreased appetite. Liver and kidney function will be monitored through out chemotherapy as abnormalities in liver and kidney function may be a side effect of treatment.  She does have a port in place by Dr. Ninfa Linden on 01/22/2017.  She has a follow-up appointment with Dr. Ninfa Linden on Friday (02/07/2017) for consideration of drainage removal.  Order is placed for MUGA scan to evaluate LVEF.  CT CAP to complete staging is ordered.  She has been referred for chemotherapy teaching.  I have built AC+T based upon NCCN guidelines via VIA pathways.    Return Day 8 of cycle 1 for NADIR check.    ORDERS PLACED FOR THIS ENCOUNTER: Orders Placed This Encounter  Procedures  . NM Cardiac Muga Rest  . CT Abdomen Pelvis W Contrast  . CT Chest W Contrast  . CBC with Differential  . Comprehensive metabolic panel  . CBC with Differential  . Comprehensive metabolic panel    MEDICATIONS PRESCRIBED THIS ENCOUNTER: No orders of the defined types were placed in this encounter.   THERAPY PLAN:  Adjuvant chemotherapy with AC + T followed by XRT therapy.  All questions were answered. The patient knows to call the clinic with any problems, questions or concerns. We can certainly see the patient much sooner if necessary.  Patient and plan discussed with Dr. Twana First and she is in agreement with the aforementioned.   This note is electronically signed by: Doy Mince 02/05/2017 5:01 PM

## 2017-02-05 NOTE — Progress Notes (Signed)
Referral to OT for [facility] clinic on 02/25/2017

## 2017-02-05 NOTE — Patient Instructions (Signed)
[place] at [facility] Discharge Instructions  RECOMMENDATIONS MADE BY THE CONSULTANT AND ANY TEST RESULTS WILL BE SENT TO YOUR REFERRING PHYSICIAN.  You were seen today by Kirby Crigler PA-C. CT and MUGA to be scheduled.  Return day 8 of Chemo.   Thank you for choosing [place] at [facility] to provide your oncology and hematology care.  To afford each patient quality time with our provider, please arrive at least 15 minutes before your scheduled appointment time.    If you have a lab appointment with the [facility] please come in thru the  Main Entrance and check in at the main information desk  You need to re-schedule your appointment should you arrive 10 or more minutes late.  We strive to give you quality time with our providers, and arriving late affects you and other patients whose appointments are after yours.  Also, if you no show three or more times for appointments you may be dismissed from the clinic at the providers discretion.     Again, thank you for choosing [facility].  Our hope is that these requests will decrease the amount of time that you wait before being seen by our physicians.       _____________________________________________________________  Should you have questions after your visit to [facility], please contact our office at [phone] between the hours of 8:30 a.m. and 4:30 p.m.  Voicemails left after 4:30 p.m. will not be returned until the following business day.  For prescription refill requests, have your pharmacy contact our office.       Resources For Cancer Patients and their Caregivers ? American Cancer Society: Can assist with transportation, wigs, general needs, runs Look Good Feel Better.        713-263-0339 ? Cancer Care: Provides financial assistance, online support groups, medication/co-pay assistance.  1-800-813-HOPE 3301378262) ? [place] Assists [place] cancer patients and their families through emotional , educational and financial support.  857-210-6247 ? [place] DSS Where to apply for food stamps, Medicaid and utility assistance. (818)400-1417 ? [facility]: Transportation to medical appointments. 313-377-4823 ? Social Security Administration: May apply for disability if have a Stage IV cancer. 4796334137 (760) 783-8240 ? [employer], Disability and Transit Services: Assists with nutrition, care and transit needs. [place] Support Programs: @10RELATIVEDAYS @ > Cancer Support Group  2nd Tuesday of the month 1pm-2pm, Journey Room  > Creative Journey  3rd Tuesday of the month 1130am-1pm, Journey Room  > Look Good Feel Better  1st Wednesday of the month 10am-12 noon, Journey Room (Call Ruth to register [phone])

## 2017-02-05 NOTE — Assessment & Plan Note (Addendum)
Stage IIIA (pT2pN1AM0) left breast cancer in the lower-outer quadrant, ER/PR/HER2 NEGATIVE, S/P left lumpectomy with positive margins by Dr. Ninfa Linden on 01/22/2017 with one positive left axillary node with positive for metastatic disease resulting in a re-excision on 01/30/2017 and left axillary lymph node dissection with invasive component focally 0.1-0.2 cm away from anterior margin but inked margin surface is negative and 0/6 benign lymph nodes.  I personally reviewed and went over pathology results with the patient.  I reviewed her staging.  Based upon the 8th ed [place] system, she is a Stage IIIA (due to her ER/PR NEGATIVITY).  Based upon the old staging system, 7th ed [facility] she is a Stage IIB.  I've discussed adjuvant chemotherapy recommendation: AC followed by weekly paclitaxel.    I reviewed the risks, benefits, alternatives, and side effects of chemotherapy including, but not limited to nausea/ vomiting, risk of infection from low WBC count, fatigue due to chemo or anemia, bruising or bleeding due to low platelets, mouth sores, loss/ change in taste and decreased appetite. Liver and kidney function will be monitored through out chemotherapy as abnormalities in liver and kidney function may be a side effect of treatment.  She does have a port in place by Dr. Ninfa Linden on 01/22/2017.  She has a follow-up appointment with Dr. Ninfa Linden on Friday (02/07/2017) for consideration of drainage removal.  Order is placed for MUGA scan to evaluate LVEF.  CT CAP to complete staging is ordered.  She has been referred for chemotherapy teaching.  I have built AC+T based upon NCCN guidelines via VIA pathways.    Return Day 8 of cycle 1 for NADIR check.

## 2017-02-05 NOTE — Progress Notes (Signed)
START ON PATHWAY REGIMEN - Breast  [id number]: Dose-Dense AC-T (Paclitaxel Weekly) - [Doxorubicin + Cyclophosphamide q14 Days x 4 Cycles, Followed by Paclitaxel 80 mg/m2 Weekly x 12 Weeks]  Dose-Dense AC q14 days:   A cycle is every 14 days:     Doxorubicin (Adriamycin(R)) 60 mg/m2 IV push on day 1 only.       Dose Mod: None     Cyclophosphamide (Cytoxan(R)) 600 mg/m2 in 250 mL NS IV over 30 minutes on day 1 only.       Dose Mod: None     Pegfilgrastim (Neulasta(R)) 6 mg flat dose subcutaneously once on day 2 only.  G-CSF recommended due to data showing a >20% risk of febrile neutropenia.       Dose Mod: None  **Always confirm dose/schedule in your pharmacy ordering system**    Paclitaxel 80 mg/m2 Weekly:   Administer weekly:     Paclitaxel (Taxol(R)) 80 mg/m2 in 250 mL NS IV over 1 hour       Dose Mod: None  **Always confirm dose/schedule in your pharmacy ordering system**    Patient Characteristics: Adjuvant Therapy, Node Positive (1-3), HER2/neu Negative/Unknown/Equivocal, ER Negative AJCC Stage Grouping: IIB Current Disease Status: No Distant Mets or Local Recurrence AJCC M Stage: 0 ER Status: Negative (-) AJCC N Stage: 1a AJCC T Stage: 2 HER2/neu: Negative (-) PR Status: Negative (-) Node Status: Positive (+) (1-3 Nodes)  Intent of Therapy: Curative Intent, Discussed with Patient

## 2017-02-05 NOTE — Progress Notes (Signed)
Went to follow up with pt during follow up appt.  Pt will start chemo AC followed by taxol.  Pt will have bedside chemo teaching when she starts next Thursday.  Pt taken to the wig room and she picked out a wig.

## 2017-02-07 ENCOUNTER — Other Ambulatory Visit (HOSPITAL_COMMUNITY): Payer: Self-pay | Admitting: Oncology

## 2017-02-07 ENCOUNTER — Encounter (HOSPITAL_COMMUNITY): Payer: Self-pay | Admitting: Emergency Medicine

## 2017-02-07 MED ORDER — DEXAMETHASONE 4 MG PO TABS: ORAL_TABLET | ORAL | 1 refills | Status: DC

## 2017-02-07 MED ORDER — LORAZEPAM 0.5 MG PO TABS: 0.5000 mg | ORAL_TABLET | Freq: Four times a day (QID) | ORAL | 0 refills | Status: DC | PRN

## 2017-02-07 MED ORDER — PROCHLORPERAZINE MALEATE 10 MG PO TABS: 10.0000 mg | ORAL_TABLET | Freq: Four times a day (QID) | ORAL | 1 refills | Status: DC | PRN

## 2017-02-07 MED ORDER — ONDANSETRON HCL 8 MG PO TABS: 8.0000 mg | ORAL_TABLET | Freq: Two times a day (BID) | ORAL | 1 refills | Status: DC | PRN

## 2017-02-07 MED ORDER — LIDOCAINE-PRILOCAINE 2.5-2.5 % EX CREA: TOPICAL_CREAM | CUTANEOUS | 3 refills | Status: DC

## 2017-02-07 NOTE — Patient Instructions (Signed)
[place]   CHEMOTHERAPY INSTRUCTIONS  You have been diagnosed with Stage 3a triple negative breast cancer.  We are going to treat you with adriamycin and cytoxan followed by taxol.  You will have 4 cycles (1 cycle is 2 weeks) of AC, then 12 weekly cycles of taxol.  This is with curative intent.  You will see the doctor regularly throughout treatment.  We monitor your lab work prior to every treatment.  The doctor monitors your response to treatment by the way you are feeling, your blood work, and scans periodically.   You will receive the following premedications prior to each chemotherapy treatment: Premeds: Aloxi - high powered nausea/vomiting prevention medication used for chemotherapy patients. Emend - high powered nausea/vomiting prevention medication used for chemotherapy patients. Dexamethasone - steroid - given to reduce the risk of you having an allergic type reaction to the chemotherapy. Dex can cause you to feel energized, nervous/anxious/jittery, make you have trouble sleeping, and/or make you feel hot/flushed in the face/neck and/or look pink/red in the face/neck. These side effects will pass as the Dex wears off. (takes 20 minutes to infuse)  You will also receive neulasta after you finish AC chemo every 2 weeks: Neulasta - this medication is not chemo but being given because you have had chemo. It is usually given 24-27 hours after the completion of chemotherapy. This medication works by boosting your bone marrow's supply of white blood cells. White blood cells are what protect our bodies against infection. The medication is given in the form of a subcutaneous injection. It is given in the fatty tissue of your abdomen. It is a short needle. The major side effect of this medication is bone or muscle pain. The drug of choice to relieve or lessen the pain is Aleve or Ibuprofen. If a physician has ever told you not to take Aleve or Ibuprofen - then don't  take it. You should then take Tylenol/acetaminophen. Take either medication as the bottle directs you to.  The level of pain you experience as a result of this injection can range from none, to mild or moderate, or severe. Please let us know if you develop moderate or severe bone pain.   You can take Claritin 10 mg over the counter for a few days after receiving neulasta to help with the bone aches and pains.    **DO NOT expose the Neulasta On-body injector to diagnostic imaging (CT scans, MRI, Ultrasound, X-ray), radiation treatment, or oxygen rich environments, such as hyperbaric chambers. **   POTENTIAL SIDE EFFECTS OF TREATMENT:  Cyclophosphamide (Generic Name) Other Names: Cytoxan, Neosar  About This Drug Cyclophosphamide is a drug used to treat cancer. It is given in the vein (IV) or by mouth.  Takes 30 minutes for this drug to infuse.  Possible Side Effects (More Common) . Nausea and throwing up (vomiting). These symptoms may happen within a few hours after your treatment and may last up to 72 hours. Medicines are available to stop or lessen these side effects. . Bone marrow depression. This is a decrease in the number of white blood cells, red blood cells, and platelets. This may raise your risk of infection, make you tired and weak (fatigue), and raise your risk of bleeding. . Hair loss: You may notice hair getting thin. Some patients lose their hair. Hair loss is often complete scalp hair loss and can involve loss of eyebrows, eyelashes, and pubic hair. You may notice this a few days  or weeks after treatment has started. Most often hair loss is temporary; your hair should grow back when treatment is done. . Decreased appetite (decreased hunger) . Blurred vision . Soreness of the mouth and throat. You may have red areas, white patches, or sores that hurt. . Effects on the bladder. This drug may cause irritation and bleeding in the bladder. You may have blood in your urine. To help stop  this, you will get extra fluids to help you pass more urine. You may get a drug called mesna, which helps to decrease irritation and bleeding. You may also get a medicine to help you pass more urine. You may have a catheter (tube) placed in your bladder so that your bladder will be washed with this drug.  Possible Side Effects (Less Common) . Darkening of the skin or nails . Metallic taste in the mouth . Changes in lung tissue may happen with large amounts of this drug. These changes may not last forever, and your lung tissue may go back to normal. Sometimes these changes may not be seen for many years. You may get a cough or have trouble catching your breath.  Allergic Reactions   Serious allergic reactions including anaphylaxis are rare. While you are getting this drug in your vein (IV), tell your nurse right away if you have any of these symptoms of an allergic reaction: . Trouble catching your breath . Feeling like your tongue or throat are swelling . Feeling your heart beat quickly or in a not normal way (palpitations) . Feeling dizzy or lightheaded . Flushing, itching, rash, and/or hives Treating Side Effects . Drink 6-8 cups of fluids each day unless your doctor has told you to limit your fluid intake due to some other health problem. A cup is 8 ounces of fluid. If you throw up or have loose bowel movements you should drink more fluids so that you do not become dehydrated (lack water in the body due to losing too much fluid). . Ask your doctor or nurse about medicine that is available to help stop or lessen nausea or throwing up. . Mouth care is very important. Your mouth care should consist of routine, gentle cleaning of your teeth or dentures and rinsing your mouth with a mixture of 1/2 teaspoon of salt in 8 ounces of water or  teaspoon of baking soda in 8 ounces of water. This should be done at least after each meal and at bedtime. . If you have mouth sores, avoid mouthwash that has  alcohol. Also avoid alcohol and smoking because they can bother your mouth and throat. . Talk with your nurse about getting a wig before you lose your hair. Also, call the [place] at 800-ACS-2345 to find out information about the " Look Good.Marland KitchenMarland KitchenFeel Better" program close to where you live. It is a free program where women undergoing chemotherapy learn about wigs, turbans and scarves as well as makeup techniques and skin and nail care.  Important Information . Whenever you tell a doctor or nurse your health history, always tell them that you have received cyclophosphamide in the past. . If you take this drug by mouth swallow the medicine whole. Do not chew, break or crush it. . You can take the medicine with or without food. If you have nausea, take it with food. Do not take the pills at bedtime.  Food and Drug Interactions There are no known interactions of cyclophosphamide with food. This drug may interact with other medicines.  Tell your doctor and pharmacist about all the medicines and dietary supplements (vitamins, minerals, herbs and others) that you are taking at this time. The safety and use of dietary supplements and alternative diets are often not known. Using these might affect your cancer or interfere with your treatment. Until more is known, you should not use dietary supplements or alternative diets without your cancer doctor's help.  When to Call the Doctor Call your doctor or nurse right away if you have any of these symptoms: . Fever of 100.5 F (38 C) or higher . Chills . Bleeding or bruising that is not normal . Blurred vision or other changes in eyesight . Pain when passing urine; blood in urine . Pain in your lower back or side . Wheezing or trouble breathing . Swelling of legs, ankles, or feet . Feeling dizzy or lightheaded . Feeling confused or agitated . Signs of liver problems: dark urine, pale bowel movements, bad stomach pain, feeling very tired and  weak, unusual itching, or yellowing of the eyes or skin . Unusual thirst or passing urine often . Nausea that stops you from eating or drinking . Throwing up more than 3 times a day  Call your doctor or nurse as soon as possible if any of these symptoms happen: . Pain in your mouth or throat that makes it hard to eat or drink . Nausea not relieved by prescribed medicines  Sexual Problems and Reproductive Concerns . Infertility warning: Sexual problems and reproduction concerns may happen. In both men and women, this drug may affect your ability to have children. This cannot be determined before your treatment. Talk with your doctor or nurse if you plan to have children. Ask for information on sperm or egg banking. . In men, this drug may interfere with your ability to make sperm, but it should not change your ability to have sexual relations. . In women, menstrual bleeding may become irregular or stop while you are getting this drug. Do not assume that you cannot become pregnant if you do not have a menstrual period. . Women may go through signs of menopause (change of life) like vaginal dryness or itching. Vaginal lubricants can be used to lessen vaginal dryness, itching, and pain during sexual relations. . Genetic counseling is available for you to talk about the effects of this drug therapy on future pregnancies. Also, a genetic counselor can look at the possible risk of problems in the unborn baby due to this medicine if an exposure happens during pregnancy. . Pregnancy warning: This drug may have harmful effects on the unborn child, so effective methods of birth control should be used during your cancer treatment. . Breast feeding warning: Women should not breast feed during treatment because this drug could enter the breast milk and badly harm a breast feeding baby   Doxorubicin (Generic Name) Other Names: Adriamycin, hydroxyl daunorubicin  About This Drug Doxorubicin is a drug used to  treat cancer. This drug is given in the vein (IV).  This drug is an IV push over about 5-10 minutes.    Possible Side Effects (More Common) . Bone marrow depression. This is a decrease in the number of white blood cells, red blood cells, and platelets. This may raise your risk of infection, make you tired and weak (fatigue), and raise your risk of bleeding. . Hair loss: Hair loss is often complete scalp hair loss and can involve loss of eyebrows, eyelashes, and pubic hair. You may notice this a few  days or weeks after treatment has started. Most often hair loss is temporary; your hair should grow back when treatment is done. . Nausea and throwing up (vomiting). These symptoms may happen within a few hours after your treatment and may last up to 24 hours. Medicines are available to stop or lessen these side effects. . Soreness of the mouth and throat. You may have red areas, white patches, or sores that hurt. . Change in the color of your urine to pink or red. This color change will go away in one to two days. . Effects on the heart: This drug can weaken the heart and lower heart function. Your heart function will be checked as needed. You may have trouble catching your breath, mainly during activities. You may also have trouble breathing while lying down, and have swelling in your ankles. . Sensitivity to light (photosensitivity). Photosensitivity means that you may become more sensitive to the effects of the sun, sun lamps, and tanning beds. Your eyes may water more, mostly in bright light. . Metallic taste in the mouth: This may change the taste of food and drinks . Decreased appetite (decreased hunger) . Darkening of the skin or nails . Weakness that interferes with your daily activities  Possible Side Effects (Less Common) . Skin and tissue irritation may involve redness, pain, warmth, or swelling at the IV site. This happens if the drug leaks out of the vein and into nearby tissue. . Changes in  your liver function. Your doctor will check your liver function as needed. . This drug may cause an increased risk of developing a second cancer  Allergic Reaction Serious allergic reactions, including anaphylaxis are rare. While you are getting this drug in your vein (IV), tell your nurse right away if you have any of these symptoms of an allergic reaction: . Trouble catching your breath . Feeling like your tongue or throat are swelling . Feeling your heart beat quickly or in a not normal way (palpitations) . Feeling dizzy or lightheaded . Flushing, itching, rash, and/or hives  Treating Side Effects . Drink 6-8 cups of fluids every day unless your doctor has told you to limit your fluid intake due to some other health problem. A cup is 8 ounces of fluid. If you vomit or have diarrhea, you should drink more fluids so that you do not become dehydrated (lack water in the body due to losing too much fluid). . Ask your doctor or nurse about medicine that is available to help stop or lessen nausea, throwing up, and/or loose bowel movements . Wear dark sunglasses and use sunscreen with SPF 30 or higher when you are outdoors even for a short time. Cover up when you are out in the sun. Wear wide-brimmed hats, long-sleeved shirts, and pants. Keep your neck, chest, and back covered. . Mouth care is very important. Your mouth care should consist of routine, gentle cleaning of your teeth or dentures and rinsing your mouth with a mixture of 1/2 teaspoon of salt in 8 ounces of water or  teaspoon of baking soda in 8 ounces of water. This should be done at least after each meal and at bedtime. . If you have mouth sores, avoid mouthwash that has alcohol. Avoid alcohol and smoking because they can bother your mouth and throat. . Talk with your nurse about getting a wig before you lose your hair. Also, call the [place] at 800-ACS-2345 to find out information about the "Look Good, Feel Better" program  close to where you live. It is a free program where women getting chemotherapy can learn about wigs, turbans and scarves as well as makeup techniques and skin and nail care. . While you are getting this drug, please tell your nurse right away if you have any pain, redness, or swelling at the site of the IV infusion.  Food and Drug Interactions There are no known interactions of doxorubicin with food. This drug may interact with other medicines. Tell your doctor and pharmacist about all the medicines and dietary supplements (vitamins, minerals, herbs and others) that you are taking at this time. The safety and use of dietary supplements and alternative diets are often not known. Using these might affect your cancer or interfere with your treatment. Until more is known, you should not use dietary supplements or alternative diets without your cancer doctor's help.  When to Call the Doctor Call your doctor or nurse right away if you have any of these symptoms: . Fever of 100.5 F (38 C) or above . Chills . Easy bruising or bleeding . Wheezing or trouble breathing . Rash or itching . Feeling dizzy or lightheaded . Feeling that your heart is beating in a fast or not normal way (palpitations) . Loose bowel movements (diarrhea) more than 4 times a day or diarrhea with weakness or feeling lightheaded . Nausea that stops you from eating or drinking . Throwing up more than 3 times a day . Signs of liver problems: dark urine, pale bowel movements, bad stomach pain, feeling very tired and weak, unusual itching, or yellowing of the eyes or skin, . During the IV infusion, if you have pain, redness, or swelling at the site of the IV infusion, please tell your nurse right away  Call your doctor or nurse as soon as possible if any of these symptoms happen: . Decreased urine . Pain in your mouth or throat that makes it hard to eat or drink . Nausea and throwing up that is not relieved by prescribed  medicines . Rash that is not relieved by prescribed medicines . Swelling of legs, ankles, or feet . Weight gain of 5 pounds in one week (fluid retention) . Lasting loss of appetite or rapid weight loss of five pounds in a week . Fatigue that interferes with your daily activities . Extreme weakness that interferes with normal activities  Sexual Problems and Reproduction Concerns . Infertility warning: Sexual problems and reproduction concerns may happen. In both men and women, this drug may affect your ability to have children. This cannot be determined before your treatment. Talk with your doctor or nurse if you plan to have children. Ask for information on sperm or egg banking. . In men, this drug may interfere with your ability to make sperm, but it should not change your ability to have sexual relations. . In women, menstrual bleeding may become irregular or stop while you are getting this drug. Do not assume that you cannot become pregnant if you do not have a menstrual period.   Paclitaxel (Taxol)  About This Drug Paclitaxel is a drug used to treat cancer. It is given in the vein (IV).  This drug will take 1 hour to infuse.  Possible Side Effects . Hair loss. Hair loss is often temporary, although with certain medicine, hair loss can sometimes be permanent. Hair loss may happen suddenly or gradually. If you lose hair, you may lose it from your head, face, armpits, pubic area, chest, and/or legs. You may also  notice your hair getting thin. . Swelling of your legs, ankles and/or feet (edema) . Flushing . Nausea and throwing up (vomiting) . Loose bowel movements (diarrhea) . Bone marrow depression. This is a decrease in the number of white blood cells, red blood cells, and platelets. This may raise your risk of infection, make you tired and weak (fatigue), and raise your risk of bleeding. . Effects on the nerves are called peripheral neuropathy. You may feel numbness, tingling, or pain  in your hands and feet. It may be hard for you to button your clothes, open jars, or walk as usual. The effect on the nerves may get worse with more doses of the drug. These effects get better in some people after the drug is stopped but it does not get better in all people. . Changes in your liver function . Bone, joint and muscle pain . Abnormal EKG . Allergic reaction: Allergic reactions, including anaphylaxis are rare but may happen in some patients. Signs of allergic reaction to this drug may be swelling of the face, feeling like your tongue or throat are swelling, trouble breathing, rash, itching, fever, chills, feeling dizzy, and/or feeling that your heart is beating in a fast or not normal way. If this happens, do not take another dose of this drug. You should get urgent medical treatment. . Infection . Changes in your kidney function. Note: Each of the side effects above was reported in 20% or greater of patients treated with paclitaxel. Not all possible side effects are included above.  Warnings and Precautions . Severe allergic reactions . Severe bone marrow depression  Treating Side Effects . To help with hair loss, wash with a mild shampoo and avoid washing your hair every day. . Avoid rubbing your scalp, instead, pat your hair or scalp dry . Avoid coloring your hair . Limit your use of hair spray, electric curlers, blow dryers, and curling irons. . If you are interested in getting a wig, talk to your nurse. You can also call the [place] at 800-ACS-2345 to find out information about the "Look Good, Feel Better" program close to where you live. It is a free program where women getting chemotherapy can learn about wigs, turbans and scarves as well as makeup techniques and skin and nail care. . Ask your doctor or nurse about medicines that are available to help stop or lessen diarrhea and/or nausea. . To help with nausea and vomiting, eat small, frequent meals instead  of three large meals a day. Choose foods and drinks that are at room temperature. Ask your nurse or doctor about other helpful tips and medicine that is available to help or stop lessen these symptoms. . If you get diarrhea, eat low-fiber foods that are high in protein and calories and avoid foods that can irritate your digestive tracts or lead to cramping. Ask your nurse or doctor about medicine that can lessen or stop your diarrhea. . Mouth care is very important. Your mouth care should consist of routine, gentle cleaning of your teeth or dentures and rinsing your mouth with a mixture of 1/2 teaspoon of salt in 8 ounces of water or  teaspoon of baking soda in 8 ounces of water. This should be done at least after each meal and at bedtime. . If you have mouth sores, avoid mouthwash that has alcohol. Also avoid alcohol and smoking because they can bother your mouth and throat. . Drink plenty of fluids (a minimum of eight glasses per day  is recommended). . Take your temperature as your doctor or nurse tells you, and whenever you feel like you may have a fever. . Talk to your doctor or nurse about precautions you can take to avoid infections and bleeding. . Be careful when cooking, walking, and handling sharp objects and hot liquids.  Food and Drug Interactions . There are no known interactions of paclitaxel with food. . This drug may interact with other medicines. Tell your doctor and pharmacist about all the medicines and dietary supplements (vitamins, minerals, herbs and others) that you are taking at this time. . The safety and use of dietary supplements and alternative diets are often not known. Using these might affect your cancer or interfere with your treatment. Until more is known, you should not use dietary supplements or alternative diets without your cancer doctor's help.  When to Call the Doctor Call your doctor or nurse if you have any of the following symptoms and/or any new or unusual  symptoms: . Fever of 100.5 F (38 C) or above . Chills . Redness, pain, warmth, or swelling at the IV site during the infusion . Signs of allergic reaction: swelling of the face, feeling like your tongue or throat are swelling, trouble breathing, rash, itching, fever, chills, feeling dizzy, and/or feeling that your heart is beating in a fast or not normal way . Feeling that your heart is beating in a fast or not normal way (palpitations) . Weight gain of 5 pounds in one week (fluid retention) . Decreased urine or very dark urine . Signs of liver problems: dark urine, pale bowel movements, bad stomach pain, feeling very tired and weak, unusual itching, or yellowing of the eyes or skin . Heavy menstrual period that lasts longer than normal . Easy bruising or bleeding . Nausea that stops you from eating or drinking, and/or that is not relieved by prescribed medicines. . Loose bowel movements (diarrhea) more than 4 times a day or diarrhea with weakness or lightheadedness . Pain in your mouth or throat that makes it hard to eat or drink . Lasting loss of appetite or rapid weight loss of five pounds in a week . Signs of peripheral neuropathy: numbness, tingling, or decreased feeling in fingers or toes; trouble walking or changes in the way you walk; or feeling clumsy when buttoning clothes, opening jars, or other routine activities . Joint and muscle pain that is not relieved by prescribed medicines . Extreme fatigue that interferes with normal activities . While you are getting this drug, please tell your nurse right away if you have any pain, redness, or swelling at the site of the IV infusion. . If you think you are pregnant.  Reproduction Warnings . Pregnancy warning: This drug may have harmful effects on the unborn child, it is recommended that effective methods of birth control should be used during your cancer treatment. Let your doctor know right away if you think you may be pregnant. .  Breast feeding warning: Women should not breast feed during treatment because this drug could enter the breast milk and cause harm to a breast feeding baby.   EDUCATIONAL MATERIALS GIVEN AND REVIEWED: Chemotherapy and you book given.  Nutrition book given.  Information on adriamycin, cytoxan, and taxol.     SELF CARE ACTIVITIES WHILE ON CHEMOTHERAPY: Hydration Increase your fluid intake 48 hours prior to treatment and drink at least 8 to 12 cups (64 ounces) of water/decaff beverages per day after treatment. You can still have your cup  of coffee or soda but these beverages do not count as part of your 8 to 12 cups that you need to drink daily. No alcohol intake.  Medications Continue taking your normal prescription medication as prescribed.  If you start any new herbal or new supplements please let us know first to make sure it is safe.  Mouth Care Have teeth cleaned professionally before starting treatment. Keep dentures and partial plates clean. Use soft toothbrush and do not use mouthwashes that contain alcohol. Biotene is a good mouthwash that is available at most pharmacies or may be ordered by calling [phone]. Use warm salt water gargles (1 teaspoon salt per 1 quart warm water) before and after meals and at bedtime. Or you may rinse with 2 tablespoons of three-percent hydrogen peroxide mixed in eight ounces of water. If you are still having problems with your mouth or sores in your mouth please call the clinic. If you need dental work, please let the doctor know before you go for your appointment so that we can coordinate the best possible time for you in regards to your chemo regimen. You need to also let your dentist know that you are actively taking chemo. We may need to do labs prior to your dental appointment.   Skin Care Always use sunscreen that has not expired and with SPF (Sun Protection Factor) of 50 or higher. Wear hats to protect your head from the sun. Remember to use  sunscreen on your hands, ears, face, & feet.  Use good moisturizing lotions such as udder cream, eucerin, or even Vaseline. Some chemotherapies can cause dry skin, color changes in your skin and nails.    . Avoid long, hot showers or baths. . Use gentle, fragrance-free soaps and laundry detergent. . Use moisturizers, preferably creams or ointments rather than lotions because the thicker consistency is better at preventing skin dehydration. Apply the cream or ointment within 15 minutes of showering. Reapply moisturizer at night, and moisturize your hands every time after you wash them.  Hair Loss (if your doctor says your hair will fall out)  . If your doctor says that your hair is likely to fall out, decide before you begin chemo whether you want to wear a wig. You may want to shop before treatment to match your hair color. . Hats, turbans, and scarves can also camouflage hair loss, although some people prefer to leave their heads uncovered. If you go bare-headed outdoors, be sure to use sunscreen on your scalp. . Cut your hair short. It eases the inconvenience of shedding lots of hair, but it also can reduce the emotional impact of watching your hair fall out. . Don't perm or color your hair during chemotherapy. Those chemical treatments are already damaging to hair and can enhance hair loss. Once your chemo treatments are done and your hair has grown back, it's OK to resume dyeing or perming hair. With chemotherapy, hair loss is almost always temporary. But when it grows back, it may be a different color or texture. In older adults who still had hair color before chemotherapy, the new growth may be completely gray.  Often, new hair is very fine and soft.  Infection Prevention Please wash your hands for at least 30 seconds using warm soapy water. Handwashing is the #1 way to prevent the spread of germs. Stay away from sick people or people who are getting over a cold. If you develop respiratory  systems such as green/yellow mucus production or productive cough  or persistent cough let us know and we will see if you need an antibiotic. It is a good idea to keep a pair of gloves on when going into grocery stores/[employer] to decrease your risk of coming into contact with germs on the carts, etc. Carry alcohol hand gel with you at all times and use it frequently if out in public. If your temperature reaches 100.5 or higher please call the clinic and let us know.  If it is after hours or on the weekend please go to the ER if your temperature is over 100.5.  Please have your own personal thermometer at home to use.    Sex and bodily fluids If you are going to have sex, a condom must be used to protect the person that isn't taking chemotherapy. Chemo can decrease your libido (sex drive). For a few days after chemotherapy, chemotherapy can be excreted through your bodily fluids.  When using the toilet please close the lid and flush the toilet twice.  Do this for a few day after you have had chemotherapy.    Effects of chemotherapy on your sex life Some changes are simple and won't last long. They won't affect your sex life permanently. Sometimes you may feel: . too tired . not strong enough to be very active . sick or sore  . not in the mood . anxious or low Your anxiety might not seem related to sex. For example, you may be worried about the cancer and how your treatment is going. Or you may be worried about money, or about how you family are coping with your illness. These things can cause stress, which can affect your interest in sex. It's important to talk to your partner about how you feel. Remember - the changes to your sex life don't usually last long. There's usually no medical reason to stop having sex during chemo. The drugs won't have any long term physical effects on your performance or enjoyment of sex. Cancer can't be passed on to your partner during sex  Contraception It's important  to use reliable contraception during treatment. Avoid getting pregnant while you or your partner are having chemotherapy. This is because the drugs may harm the baby. Sometimes chemotherapy drugs can leave a man or woman infertile.  This means you would not be able to have children in the future. You might want to talk to someone about permanent infertility. It can be very difficult to learn that you may no longer be able to have children. Some people find counselling helpful. There might be ways to preserve your fertility, although this is easier for men than for women. You may want to speak to a fertility expert. You can talk about sperm banking or harvesting your eggs. You can also ask about other fertility options, such as donor eggs. If you have or have had breast cancer, your doctor might advise you not to take the contraceptive pill. This is because the hormones in it might affect the cancer.  It is not known for sure whether or not chemotherapy drugs can be passed on through semen or secretions from the vagina. Because of this some doctors advise people to use a barrier method if you have sex during treatment. This applies to vaginal, anal or oral sex. Generally, doctors advise a barrier method only for the time you are actually having the treatment and for about a week after your treatment. Advice like this can be worrying, but this does not mean that  you have to avoid being intimate with your partner. You can still have close contact with your partner and continue to enjoy sex.  Animals If you have cats or birds we just ask that you not change the litter or change the cage.  Please have someone else do this for you while you are on chemotherapy.   Food Safety During and After Cancer Treatment Food safety is important for people both during and after cancer treatment. Cancer and cancer treatments, such as chemotherapy, radiation therapy, and stem cell/bone marrow transplantation, often weaken the  immune system. This makes it harder for your body to protect itself from foodborne illness, also called food poisoning. Foodborne illness is caused by eating food that contains harmful bacteria, parasites, or viruses.  Foods to avoid Some foods have a higher risk of becoming tainted with bacteria. These include: Marland Kitchen Unwashed fresh fruit and vegetables, especially leafy vegetables that can hide dirt and other contaminants . Raw sprouts, such as alfalfa sprouts . Raw or undercooked beef, especially ground beef, or other raw or undercooked meat and poultry . Fatty, fried, or spicy foods immediately before or after treatment.  These can sit heavy on your stomach and make you feel nauseous. . Raw or undercooked shellfish, such as oysters. . Sushi and sashimi, which often contain raw fish.  . Unpasteurized beverages, such as unpasteurized fruit juices, raw milk, raw yogurt, or cider . Undercooked eggs, such as soft boiled, over easy, and poached; raw, unpasteurized eggs; or foods made with raw egg, such as homemade raw cookie dough and homemade mayonnaise Simple steps for food safety Shop smart. . Do not buy food stored or displayed in an unclean area. . Do not buy bruised or damaged fruits or vegetables. . Do not buy cans that have cracks, dents, or bulges. . Pick up foods that can spoil at the end of your shopping trip and store them in a cooler on the way home. Prepare and clean up foods carefully. . Rinse all fresh fruits and vegetables under running water, and dry them with a clean towel or paper towel. . Clean the top of cans before opening them. . After preparing food, wash your hands for 20 seconds with hot water and soap. Pay special attention to areas between fingers and under nails. . Clean your utensils and dishes with hot water and soap. Marland Kitchen Disinfect your kitchen and cutting boards using 1 teaspoon of liquid, unscented bleach mixed into 1 quart of water.   Dispose of old food. . Eat  canned and packaged food before its expiration date (the "use by" or "best before" date). . Consume refrigerated leftovers within 3 to 4 days. After that time, throw out the food. Even if the food does not smell or look spoiled, it still may be unsafe. Some bacteria, such as Listeria, can grow even on foods stored in the refrigerator if they are kept for too long. Take precautions when eating out. . At restaurants, avoid buffets and salad bars where food sits out for a long time and comes in contact with many people. Food can become contaminated when someone with a virus, often a norovirus, or another "bug" handles it. . Put any leftover food in a "to-go" container yourself, rather than having the server do it. And, refrigerate leftovers as soon as you get home. . Choose restaurants that are clean and that are willing to prepare your food as you order it cooked.     MEDICATIONS: Lorazepam (Ativan) 0.5  mg tablets:  Take 1 tablet (0.5 mg total) by mouth every 6 (six) hours as needed (Nausea or vomiting).  Dexamethasone 4mg  tablet.  Take 2 tablets by mouth once a day on the day after chemotherapy and then take 2 tablets two times a day for 2 days. Take with food.   Zofran/Ondansetron 8mg  tablet. Take 1 tablet every 8 hours as needed for nausea/vomiting. (#1 nausea med to take, this can constipate)  Compazine/Prochlorperazine 10mg  tablet. Take 1 tablet every 6 hours as needed for nausea/vomiting. (#2 nausea med to take, this can make you sleepy)   EMLA cream. Apply a quarter size amount to port site 1 hour prior to chemo. Do not rub in. Cover with plastic wrap.   Over-the-Counter Meds:  Miralax 1 capful in 8 oz of fluid daily. May increase to two times a day if needed. This is a stool softener. If this doesn't work proceed you can add:  Senokot S-start with 1 tablet two times a day and increase to 4 tablets two times a day if needed. (total of 8 tablets in a 24 hour period). This is a  stimulant laxative.   Call us if this does not help your bowels move.   Imodium 2mg  capsule. Take 2 capsules after the 1st loose stool and then 1 capsule every 2 hours until you go a total of 12 hours without having a loose stool. Call the [facility] if loose stools continue. If diarrhea occurs @ bedtime, take 2 capsules @ bedtime. Then take 2 capsules every 4 hours until morning. Call [facility].     Diarrhea Sheet  If you are having loose stools/diarrhea, please purchase Imodium and begin taking as outlined:  At the first sign of poorly formed or loose stools you should begin taking Imodium(loperamide) 2 mg capsules.  Take two caplets (4mg ) followed by one caplet (2mg ) every 2 hours until you have had no diarrhea for 12 hours.  During the night take two caplets (4mg ) at bedtime and continue every 4 hours during the night until the morning.  Stop taking Imodium only after there is no sign of diarrhea for 12 hours.    Always call the [facility] if you are having loose stools/diarrhea that you can't get under control.  Loose stools/disrrhea leads to dehydration (loss of water) in your body.  We have other options of trying to get the loose stools/diarrhea to stopped but you must let us know!     Constipation Sheet *Miralax in 8 oz of fluid daily.  May increase to two times a day if needed.  This is a stool softener.  If this not enough to keep your bowel regular:  You can add:  *Senokot S, start with one tablet twice a day and can increase to 4 tablets twice a day if needed.  This is a stimulant laxative.   Sometimes when you take pain medication you need BOTH a medicine to keep your stool soft and a medicine to help your bowel push it out!  Please call if the above does not work for you.   Do not go more than 2 days without a bowel movement.  It is very important that you do not become constipated.  It will make you feel sick to your stomach (nausea) and can cause abdominal pain  and vomiting.      Nausea Sheet  Zofran/Ondansetron 8mg  tablet. Take 1 tablet every 8 hours as needed for nausea/vomiting. (#1 nausea med to take,  this can constipate)  Compazine/Prochlorperazine 10mg  tablet. Take 1 tablet every 6 hours as needed for nausea/vomiting. (#2 nausea med to take, this can make you sleepy)  You can take these medications together or separately.  We would first like for you to try the Ondansetron by itself and then take the Prochloperizine if needed. But you are allowed to take both medications at the same time if your nausea is that severe.  If you are having persistent nausea (nausea that does not stop) please take these medications on a staggered schedule so that the nausea medication stays in your body.  Please call the [facility] and let us know the amount of nausea that you are experiencing.  If you begin to vomit, you need to call the [facility] and if it is the weekend and you have vomited more than one time and cant get it to stop-go to the Emergency Room.  Persistent nausea/vomiting can lead to dehydration (loss of fluid in your body) and will make you feel terrible.   Ice chips, sips of clear liquids, foods that are @ room temperature, crackers, and toast tend to be better tolerated.     SYMPTOMS TO REPORT AS SOON AS POSSIBLE AFTER TREATMENT:  FEVER GREATER THAN 100.5 F  CHILLS WITH OR WITHOUT FEVER  NAUSEA AND VOMITING THAT IS NOT CONTROLLED WITH YOUR NAUSEA MEDICATION  UNUSUAL SHORTNESS OF BREATH  UNUSUAL BRUISING OR BLEEDING  TENDERNESS IN MOUTH AND THROAT WITH OR WITHOUT PRESENCE OF ULCERS  URINARY PROBLEMS  BOWEL PROBLEMS  UNUSUAL RASH     Wear comfortable clothing and clothing appropriate for easy access to any Portacath or PICC line. Let us know if there is anything that we can do to make your therapy better!     What to do if you need assistance after hours or on the weekends: CALL [phone].  HOLD on the line, do not  hang up.  You will hear multiple messages but at the end you will be connected with a nurse triage line.  They will contact the doctor if necessary.  Most of the time they will be able to assist you.  Do not call the hospital operator.       I have been informed and understand all of the instructions given to me and have received a copy. I have been instructed to call the clinic [phone] or my family physician as soon as possible for continued medical care, if indicated. I do not have any more questions at this time but understand that I may call the [facility] or the Patient Navigator at [phone] during office hours should I have questions or need assistance in obtaining follow-up care.

## 2017-02-07 NOTE — Progress Notes (Signed)
Chemotherapy education pulled together.  appts made.  meds escribed.

## 2017-02-10 ENCOUNTER — Encounter (HOSPITAL_COMMUNITY): Payer: Self-pay

## 2017-02-10 ENCOUNTER — Encounter (HOSPITAL_COMMUNITY)
Admission: RE | Admit: 2017-02-10 | Discharge: 2017-02-10 | Disposition: A | Discharge status: Home / Self Care | Payer: BLUE CROSS/BLUE SHIELD | Source: Ambulatory Visit | Attending: Oncology | Admitting: Oncology

## 2017-02-10 ENCOUNTER — Telehealth (HOSPITAL_COMMUNITY): Payer: Self-pay | Admitting: Emergency Medicine

## 2017-02-10 ENCOUNTER — Other Ambulatory Visit (HOSPITAL_COMMUNITY): Payer: Self-pay

## 2017-02-10 DIAGNOSIS — C50512 Malignant neoplasm of lower-outer quadrant of left female breast: Secondary | ICD-10-CM | POA: Insufficient documentation

## 2017-02-10 DIAGNOSIS — Z171 Estrogen receptor negative status [ER-]: Secondary | ICD-10-CM | POA: Insufficient documentation

## 2017-02-10 MED ORDER — MISC. DEVICES MISC: 0 refills | Status: DC

## 2017-02-10 MED ORDER — HEPARIN SOD (PORK) LOCK FLUSH 100 UNIT/ML IV SOLN
INTRAVENOUS | Status: AC
  Filled 2017-02-10: qty 5

## 2017-02-10 MED ORDER — TECHNETIUM TC 99M-LABELED RED BLOOD CELLS IV KIT
20.0000 | PACK | Freq: Once | INTRAVENOUS | Status: AC | PRN
  Administered 2017-02-10: 20.4 via INTRAVENOUS

## 2017-02-10 NOTE — Telephone Encounter (Signed)
Pt came up to ask some questions. She wants a wig prescription.  I will have that ready for her when I teach on Thursday. Asked about acrylic nails.  I told her it would be best for not to have them, and we recommend for her not have have manicure and pedicures while she is on treatment.   Asked about itemized bill and it told her that she could get that from [facility] when it was time. She verbalized understanding.

## 2017-02-11 ENCOUNTER — Ambulatory Visit (HOSPITAL_COMMUNITY)
Admission: RE | Admit: 2017-02-11 | Discharge: 2017-02-11 | Disposition: A | Discharge status: Home / Self Care | Payer: BLUE CROSS/BLUE SHIELD | Source: Ambulatory Visit | Attending: Oncology | Admitting: Oncology

## 2017-02-11 DIAGNOSIS — I700 Atherosclerosis of aorta: Secondary | ICD-10-CM | POA: Insufficient documentation

## 2017-02-11 DIAGNOSIS — C50512 Malignant neoplasm of lower-outer quadrant of left female breast: Secondary | ICD-10-CM

## 2017-02-11 DIAGNOSIS — Z171 Estrogen receptor negative status [ER-]: Secondary | ICD-10-CM | POA: Insufficient documentation

## 2017-02-11 DIAGNOSIS — I2510 Atherosclerotic heart disease of native coronary artery without angina pectoris: Secondary | ICD-10-CM | POA: Diagnosis not present

## 2017-02-11 MED ORDER — IOPAMIDOL (ISOVUE-300) INJECTION 61%
100.0000 mL | Freq: Once | INTRAVENOUS | Status: AC | PRN
  Administered 2017-02-11: 100 mL via INTRAVENOUS

## 2017-02-12 ENCOUNTER — Encounter: Payer: BLUE CROSS/BLUE SHIELD | Admitting: *Deleted

## 2017-02-13 ENCOUNTER — Encounter (HOSPITAL_COMMUNITY): Payer: BLUE CROSS/BLUE SHIELD | Attending: Hematology & Oncology

## 2017-02-13 ENCOUNTER — Encounter (HOSPITAL_COMMUNITY): Payer: BLUE CROSS/BLUE SHIELD

## 2017-02-13 VITALS — BP 131/67 | HR 71 | Temp 97.9°F | Resp 18 | Wt 212.0 lb

## 2017-02-13 DIAGNOSIS — C50512 Malignant neoplasm of lower-outer quadrant of left female breast: Secondary | ICD-10-CM | POA: Diagnosis not present

## 2017-02-13 DIAGNOSIS — Z171 Estrogen receptor negative status [ER-]: Secondary | ICD-10-CM | POA: Insufficient documentation

## 2017-02-13 DIAGNOSIS — Z5111 Encounter for antineoplastic chemotherapy: Secondary | ICD-10-CM

## 2017-02-13 DIAGNOSIS — Z5189 Encounter for other specified aftercare: Secondary | ICD-10-CM | POA: Diagnosis not present

## 2017-02-13 LAB — COMPREHENSIVE METABOLIC PANEL
ALBUMIN: 3.6 g/dL (ref 3.5–5.0)
ALK PHOS: 71 U/L (ref 38–126)
ALT: 16 U/L (ref 14–54)
ANION GAP: 8 (ref 5–15)
AST: 19 U/L (ref 15–41)
BILIRUBIN TOTAL: 0.8 mg/dL (ref 0.3–1.2)
BUN: 12 mg/dL (ref 6–20)
CALCIUM: 8.9 mg/dL (ref 8.9–10.3)
CO2: 26 mmol/L (ref 22–32)
Chloride: 105 mmol/L (ref 101–111)
Creatinine, Ser: 0.85 mg/dL (ref 0.44–1.00)
GFR calc Af Amer: >60 mL/min (ref >60)
GFR calc non Af Amer: >60 mL/min (ref >60)
GLUCOSE: 120 mg/dL — AB (ref 65–99)
Potassium: 3.6 mmol/L (ref 3.5–5.1)
Sodium: 139 mmol/L (ref 135–145)
TOTAL PROTEIN: 6.7 g/dL (ref 6.5–8.1)

## 2017-02-13 LAB — CBC WITH DIFFERENTIAL/PLATELET
BASOS PCT: 0 %
Basophils Absolute: 0 10*3/uL (ref 0.0–0.1)
Eosinophils Absolute: 0.2 10*3/uL (ref 0.0–0.7)
Eosinophils Relative: 3 %
HEMATOCRIT: 39.2 % (ref 36.0–46.0)
HEMOGLOBIN: 12.8 g/dL (ref 12.0–15.0)
LYMPHS ABS: 2.7 10*3/uL (ref 0.7–4.0)
Lymphocytes Relative: 39 %
MCH: 30.3 pg (ref 26.0–34.0)
MCHC: 32.7 g/dL (ref 30.0–36.0)
MCV: 92.9 fL (ref 78.0–100.0)
MONOS PCT: 7 %
Monocytes Absolute: 0.5 10*3/uL (ref 0.1–1.0)
NEUTROS ABS: 3.5 10*3/uL (ref 1.7–7.7)
NEUTROS PCT: 51 %
Platelets: 315 10*3/uL (ref 150–400)
RBC: 4.22 MIL/uL (ref 3.87–5.11)
RDW: 13.9 % (ref 11.5–15.5)
WBC: 6.8 10*3/uL (ref 4.0–10.5)

## 2017-02-13 MED ORDER — SODIUM CHLORIDE 0.9 % IV SOLN
Freq: Once | INTRAVENOUS | Status: AC
  Administered 2017-02-13: 10:00:00 via INTRAVENOUS

## 2017-02-13 MED ORDER — DOXORUBICIN HCL CHEMO IV INJECTION 2 MG/ML
60.0000 mg/m2 | Freq: Once | INTRAVENOUS | Status: AC
  Administered 2017-02-13: 126 mg via INTRAVENOUS
  Filled 2017-02-13: qty 63

## 2017-02-13 MED ORDER — PALONOSETRON HCL INJECTION 0.25 MG/5ML
0.2500 mg | Freq: Once | INTRAVENOUS | Status: AC
  Administered 2017-02-13: 0.25 mg via INTRAVENOUS

## 2017-02-13 MED ORDER — PALONOSETRON HCL INJECTION 0.25 MG/5ML
INTRAVENOUS | Status: AC
  Filled 2017-02-13: qty 5

## 2017-02-13 MED ORDER — METHYLPREDNISOLONE SODIUM SUCC 125 MG IJ SOLR
INTRAMUSCULAR | Status: AC
  Filled 2017-02-13: qty 2

## 2017-02-13 MED ORDER — SODIUM CHLORIDE 0.9 % IV SOLN
600.0000 mg/m2 | Freq: Once | INTRAVENOUS | Status: AC
  Administered 2017-02-13: 1260 mg via INTRAVENOUS
  Filled 2017-02-13: qty 49

## 2017-02-13 MED ORDER — HEPARIN SOD (PORK) LOCK FLUSH 100 UNIT/ML IV SOLN
INTRAVENOUS | Status: AC
  Filled 2017-02-13: qty 5

## 2017-02-13 MED ORDER — FOSAPREPITANT DIMEGLUMINE INJECTION 150 MG
Freq: Once | INTRAVENOUS | Status: AC
  Administered 2017-02-13: 11:00:00 via INTRAVENOUS
  Filled 2017-02-13: qty 5

## 2017-02-13 MED ORDER — HEPARIN SOD (PORK) LOCK FLUSH 100 UNIT/ML IV SOLN
500.0000 [IU] | Freq: Once | INTRAVENOUS | Status: AC | PRN
  Administered 2017-02-13: 500 [IU]

## 2017-02-13 MED ORDER — DIPHENHYDRAMINE HCL 50 MG/ML IJ SOLN
INTRAMUSCULAR | Status: AC
  Filled 2017-02-13: qty 1

## 2017-02-13 MED ORDER — PEGFILGRASTIM 6 MG/0.6ML ~~LOC~~ PSKT
6.0000 mg | PREFILLED_SYRINGE | Freq: Once | SUBCUTANEOUS | Status: AC
  Administered 2017-02-13: 6 mg via SUBCUTANEOUS
  Filled 2017-02-13: qty 0.6

## 2017-02-13 NOTE — Progress Notes (Signed)
Chemotherapy teaching completed.  Consent obtained.  Extensive teaching packet given.

## 2017-02-13 NOTE — Progress Notes (Signed)
Tolerated chemo well. Marland KitchenArlana Pouch arrived today for [facility] neulasta on body injector. See MAR for administration details. Injector in place and engaged with green light indicator on flashing. Tolerated application with out problems. Stable on discharge home via wheelchair with family.

## 2017-02-13 NOTE — Patient Instructions (Signed)
[facility] Discharge Instructions for Patients Receiving Chemotherapy   Beginning January 23rd 2017 lab work for the [facility] will be done in the  Main lab at [facility] on 1st floor. If you have a lab appointment with the [facility] please come in thru the  Main Entrance and check in at the main information desk   Today you received the following chemotherapy agents adriamycin and cytoxan.  To help prevent nausea and vomiting after your treatment, we encourage you to take your nausea medication as instructed.   If you develop nausea and vomiting, or diarrhea that is not controlled by your medication, call the clinic.  The clinic phone number is [phone]. Office hours are Monday-Friday 8:30am-5:00pm.  BELOW ARE SYMPTOMS THAT SHOULD BE REPORTED IMMEDIATELY:  *FEVER GREATER THAN 101.0 F  *CHILLS WITH OR WITHOUT FEVER  NAUSEA AND VOMITING THAT IS NOT CONTROLLED WITH YOUR NAUSEA MEDICATION  *UNUSUAL SHORTNESS OF BREATH  *UNUSUAL BRUISING OR BLEEDING  TENDERNESS IN MOUTH AND THROAT WITH OR WITHOUT PRESENCE OF ULCERS  *URINARY PROBLEMS  *BOWEL PROBLEMS  UNUSUAL RASH Items with * indicate a potential emergency and should be followed up as soon as possible. If you have an emergency after office hours please contact your primary care physician or go to the nearest emergency department.  Please call the clinic during office hours if you have any questions or concerns.   You may also contact the Patient Navigator at [phone] should you have any questions or need assistance in obtaining follow up care.      Resources For Cancer Patients and their Caregivers ? American Cancer Society: Can assist with transportation, wigs, general needs, runs Look Good Feel Better.        781 524 8066 ? Cancer Care: Provides financial assistance, online support groups, medication/co-pay assistance.  1-800-813-HOPE (431) 332-8905) ? [facility] Assists [place] cancer patients and their families through emotional , educational and financial support.  585-348-5456 ? [place] DSS Where to apply for food stamps, Medicaid and utility assistance. 347-340-8263 ? [facility]: Transportation to medical appointments. (435)285-8424 ? Social Security Administration: May apply for disability if have a Stage IV cancer. 201-820-2129 401-713-0548 ? [employer], Disability and Transit Services: Assists with nutrition, care and transit needs. ([phone] 622-2131

## 2017-02-14 ENCOUNTER — Encounter (HOSPITAL_COMMUNITY): Payer: Self-pay | Admitting: *Deleted

## 2017-02-17 ENCOUNTER — Telehealth (HOSPITAL_COMMUNITY): Payer: Self-pay | Admitting: *Deleted

## 2017-02-17 NOTE — Progress Notes (Signed)
Attempted to reach patient by telephone to follow up first chemotherapy treatment. Patient was unavailable/out of town. Will call back on Monday to follow up.

## 2017-02-17 NOTE — Telephone Encounter (Signed)
Patient reports feeling very nauseated on same day as chemo. Reports started taking compazine on same day. Denies any vomiting. Reports she had an allergic reaction to prednisone in the past that caused her face,lips and hands to go numb. Report the evening of chemo she felt like her face was tight and she couldn't swallow, didn't know if it was chemo or dexamethasone. Reports when taking dexamethasone at home she experienced the face tightening as well. Reports feeling ok today.

## 2017-02-19 ENCOUNTER — Encounter: Payer: Self-pay | Admitting: Radiation Oncology

## 2017-02-19 ENCOUNTER — Other Ambulatory Visit (HOSPITAL_COMMUNITY): Payer: Self-pay | Admitting: Oncology

## 2017-02-19 ENCOUNTER — Other Ambulatory Visit: Payer: BLUE CROSS/BLUE SHIELD

## 2017-02-19 ENCOUNTER — Other Ambulatory Visit (HOSPITAL_COMMUNITY): Payer: Self-pay | Admitting: Emergency Medicine

## 2017-02-19 ENCOUNTER — Encounter: Payer: BLUE CROSS/BLUE SHIELD | Admitting: Genetic Counselor

## 2017-02-19 MED ORDER — PROMETHAZINE HCL 25 MG PO TABS: 25.0000 mg | ORAL_TABLET | Freq: Four times a day (QID) | ORAL | 1 refills | Status: DC | PRN

## 2017-02-19 NOTE — Progress Notes (Signed)
Tumor board note Patient's anterior margin is at skin; there is no more tissue to take per Dr Ninfa Linden.  Will boost this area when designing radiotherapy.    -----------------------------------  Eppie Gibson, MD

## 2017-02-19 NOTE — Progress Notes (Signed)
Pt called and stated that she was having face tingling and tightness.  She didn't know if this was coming from the chemo or the steroids.  She states that periodically when she takes a breath it feels like she is having a hard time catching her breathe.  I explained that if she was going to react to chemo she would have did it the day she had chemotherapy and that this did not sound chemotherapy related.  She wanted to know if she could stop taking her steroids.  Also she is having terrible nausea.  She is taking the zofran and ativan but the compazine turns her red.  I spoke with Kirby Crigler PA and I called in phenergan 25 mg for nausea and she can stop taking her steroids.  Pt could also take tylenol and benadryl to help with the symptoms she is having.  Pt verbalized understanding.

## 2017-02-20 ENCOUNTER — Encounter (HOSPITAL_COMMUNITY): Payer: Self-pay

## 2017-02-20 ENCOUNTER — Encounter (HOSPITAL_COMMUNITY): Payer: BLUE CROSS/BLUE SHIELD

## 2017-02-20 ENCOUNTER — Encounter (HOSPITAL_COMMUNITY): Payer: BLUE CROSS/BLUE SHIELD | Admitting: Genetic Counselor

## 2017-02-20 ENCOUNTER — Encounter (HOSPITAL_COMMUNITY): Payer: BLUE CROSS/BLUE SHIELD | Attending: Oncology | Admitting: Oncology

## 2017-02-20 ENCOUNTER — Other Ambulatory Visit (HOSPITAL_COMMUNITY): Payer: BLUE CROSS/BLUE SHIELD

## 2017-02-20 VITALS — BP 118/76 | HR 87 | Temp 98.6°F | Resp 16 | Wt 204.3 lb

## 2017-02-20 DIAGNOSIS — Z171 Estrogen receptor negative status [ER-]: Secondary | ICD-10-CM

## 2017-02-20 DIAGNOSIS — C50512 Malignant neoplasm of lower-outer quadrant of left female breast: Secondary | ICD-10-CM

## 2017-02-20 DIAGNOSIS — C773 Secondary and unspecified malignant neoplasm of axilla and upper limb lymph nodes: Secondary | ICD-10-CM

## 2017-02-20 LAB — CBC WITH DIFFERENTIAL/PLATELET
Basophils Absolute: 0 10*3/uL (ref 0.0–0.1)
Basophils Relative: 1 %
EOS ABS: 0.2 10*3/uL (ref 0.0–0.7)
Eosinophils Relative: 15 %
HCT: 41.2 % (ref 36.0–46.0)
Hemoglobin: 13.3 g/dL (ref 12.0–15.0)
Lymphocytes Relative: 75 %
Lymphs Abs: 1.1 10*3/uL (ref 0.7–4.0)
MCH: 30 pg (ref 26.0–34.0)
MCHC: 32.3 g/dL (ref 30.0–36.0)
MCV: 92.8 fL (ref 78.0–100.0)
MONO ABS: 0.1 10*3/uL (ref 0.1–1.0)
Monocytes Relative: 4 %
NEUTROS PCT: 5 %
Neutro Abs: 0.1 10*3/uL — ABNORMAL LOW (ref 1.7–7.7)
PLATELETS: 171 10*3/uL (ref 150–400)
RBC: 4.44 MIL/uL (ref 3.87–5.11)
RDW: 13.1 % (ref 11.5–15.5)
WBC: 1.5 10*3/uL — AB (ref 4.0–10.5)

## 2017-02-20 LAB — COMPREHENSIVE METABOLIC PANEL
ALT: 14 U/L (ref 14–54)
AST: 13 U/L — ABNORMAL LOW (ref 15–41)
Albumin: 3.7 g/dL (ref 3.5–5.0)
Alkaline Phosphatase: 82 U/L (ref 38–126)
Anion gap: 6 (ref 5–15)
BUN: 15 mg/dL (ref 6–20)
CHLORIDE: 99 mmol/L — AB (ref 101–111)
CO2: 29 mmol/L (ref 22–32)
CREATININE: 0.78 mg/dL (ref 0.44–1.00)
Calcium: 9.2 mg/dL (ref 8.9–10.3)
GFR calc non Af Amer: >60 mL/min (ref >60)
Glucose, Bld: 114 mg/dL — ABNORMAL HIGH (ref 65–99)
Potassium: 4.1 mmol/L (ref 3.5–5.1)
SODIUM: 134 mmol/L — AB (ref 135–145)
Total Bilirubin: 0.9 mg/dL (ref 0.3–1.2)
Total Protein: 7 g/dL (ref 6.5–8.1)

## 2017-02-20 NOTE — Progress Notes (Signed)
Shannon Maize, MD [place] [id number]  No diagnosis found.  Progress Note   CURRENT THERAPY: Adjuvant chemotherapy with AC q2 weeks x4 cycles followed by weekly Taxol x12 cycles  INTERVAL HISTORY: Shannon Logan 51 y.o. female returns for followup of Stage IIIA (pT2pN1AM0) left breast cancer in the lower-outer quadrant, ER/PR/HER2 NEGATIVE, S/P left lumpectomy with positive margins by Dr. Ninfa Linden on 01/22/2017 with one positive left axillary node with positive for metastatic disease resulting in a re-excision on 01/30/2017 and left axillary lymph node dissection with invasive component focally 0.1-0.2 cm away from anterior margin but inked margin surface is negative and 0/6 benign lymph nodes.    Breast cancer of lower-outer quadrant of left female breast ([place])   11/19/2016 Mammogram    In the lateral aspect of the left breast, posterior depth, there is an irregular spiculated mass measuring approximately 1.8 cm. There are a few pleomorphic calcifications in a linear distribution within the mass and extending slightly anterior to the mass. All together the mass and calcifications span approximately 2.5 cm.      11/21/2016 Procedure    Breast, left, needle core biopsy, 3:30 o'clock, 8 cm fn      11/22/2016 Pathology Results    Breast, left, needle core biopsy, 3:30 o'clock, 8 cm fn - INVASIVE DUCTAL CARCINOMA, GRADE 2.      01/03/2017 Initial Diagnosis    Breast cancer of lower-outer quadrant of left female breast ([place])     01/22/2017 Procedure    Left breast lumpectomy and left axillary sentinel node biopsy by Dr. Ninfa Linden      01/22/2017 Procedure    Port placed by Dr. Ninfa Linden      01/23/2017 Pathology Results    1. Breast, lumpectomy, Left - INVASIVE GRADE 3 DUCTAL CARCINOMA, SPANNING 2.2 CM IN GREATEST DIMENSION. 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 - LYMPH/VASCULAR INVASION IS PRESENT. - ANTERIOR / MEDIAL MARGIN  IS FOCALLY POSITIVE FOR INVASIVE DUCTAL CARCINOMA AND ANTERIOR MARGIN IS BROADLY POSITIVE FOR INVASIVE DUCTAL CARCINOMA. - DUCTAL CARCINOMA IN SITU IS FOCALLY LESS THAN 0.1 CM TO POSTERIOR MARGIN. - OTHER MARGINS ARE NEGATIVE. - SEE ONCOLOGY TEMPLATE. 2. Lymph node, sentinel, biopsy, Left axillary - ONE BENIGN LYMPH NODE WITH NO TUMOR SEEN (0/1). 3. Lymph node, sentinel, biopsy, Left axillary - ONE LYMPH NODE POSITIVE FOR METASTATIC DUCTAL CARCINOMA (1/1).      01/30/2017 Procedure    Breast, excision, Left, new anterior and medial margins and Lymph nodes, regional resection, Left axillary contents      01/31/2017 Pathology Results    Diagnosis 1. Breast, excision, Left, new anterior and medial margins - RESIDUAL INVASIVE DUCTAL CARCINOMA AND EXTENSIVE RESIDUAL DUCTAL CARCINOMA IN SITU. - INVASIVE DUCTAL CARCINOMA IS FOCALLY 0.1 TO 0.2 CM AWAY FROM ANTERIOR MARGIN BUT INKED MARGINAL SURFACE IS NEGATIVE. - DUCTAL CARCINOMA IN SITU IS FOCALLY LESS THAN 0.1 CM AWAY FROM ANTERIOR MARGIN AND FOCALLY LESS THAN 0.1 CM AWAY FROM MEDIAL ASPECT OF ANTERIOR MARGIN. - SEE COMMENT. 2. Lymph nodes, regional resection, Left axillary contents - SIX BENIGN LYMPH NODES WITH NO TUMOR SEEN (0/6).      02/10/2017 Imaging    [facility]- LEFT ventricular ejection fraction of 54%.  No focal wall motion abnormalities.       02/12/2017 Imaging    CT CAP- 1. Postoperative changes of recent left lumpectomy and left axillary lymph node dissection, without evidence of metastatic disease.  2. Aortic atherosclerosis (ICD10-170.0). Coronary artery calcification. 3. Liver appears fatty.       Shannon Logan is presents today for continued follow up.   She complains of having a numb face.  She also notes her whole body has a numb sensation. She is allergic to prednisone so she doesn't know if it is related to it. Her allergic reaction is that she gets facial numbness. She took decadron for the 3 days after chemotherapy,  but she still currently has whole body numbness.   She states she had difficulty  swallowing after chemotherapy for 2 days and a hard time catching her breath when she sleeps at night. She can eat and drink normally, but it "just feels difficult." She also notes having leg twitching the first two days after chemotherapy. All of these symptoms have resolved and only occurred during the first couple days after chemotherapy.    She feels very tired. Today was the first day where she was able to do something around the house. "Today has been my best day."  Denies chest pain, sob, acid reflux.   Patient does not have any other questions or concerns at this time.    Review of Systems  Constitutional: Positive for malaise/fatigue.  HENT: Negative.        Difficutly swallowing for first couple days after treatment. Currently resolved    Eyes: Negative.   Respiratory: Negative.  Negative for shortness of breath.        Difficulty catching her breath for first couple days after chemotherapy treatment. Currently resolved.  Cardiovascular: Negative.  Negative for chest pain.  Gastrointestinal: Negative.  Negative for nausea.       Denies acid reflux.  Genitourinary: Negative.   Musculoskeletal: Negative.        Leg twitching for the first 2 days after chemotherapy treatment. Currently resolved.    Skin: Negative.   Neurological: Positive for tingling (whole body numbing sensation, more on her face.).  Endo/Heme/Allergies: Negative.   Psychiatric/Behavioral: Negative.   All other systems reviewed and are negative.   Past Medical History:  Diagnosis Date  . Breast cancer of lower-outer quadrant of left female breast ([place]) 01/03/2017  . Cancer ([place]) 11/22/2016   Breast   . Family history of breast cancer   . GERD (gastroesophageal reflux disease)     Past Surgical History:  Procedure Laterality Date  . BREAST LUMPECTOMY WITH AXILLARY LYMPH NODE DISSECTION Left 01/30/2017   Procedure:  RE-EXCISION OF LEFT BREAST LUMPECTOMY AND LEFT AXILLARY LYMPH NODE DISSECTION;  Surgeon: Coralie Keens, MD;  Location: [facility];  Service: General;  Laterality: Left;  . BREAST LUMPECTOMY WITH RADIOACTIVE SEED AND SENTINEL LYMPH NODE BIOPSY Left 01/22/2017   Procedure: BREAST LUMPECTOMY WITH RADIOACTIVE SEED AND SENTINEL LYMPH NODE BIOPSY;  Surgeon: Coralie Keens, MD;  Location: [facility];  Service: General;  Laterality: Left;  . COLONOSCOPY N/A 10/26/2015   Procedure: COLONOSCOPY;  Surgeon: Rogene Houston, MD;  [facility];  Service: Endoscopy;  Laterality: N/A;  . DILATION AND CURETTAGE OF UTERUS    . ESOPHAGOGASTRODUODENOSCOPY N/A 10/26/2015   Procedure: ESOPHAGOGASTRODUODENOSCOPY (EGD);  Surgeon: Rogene Houston, MD;  [facility];  Service: Endoscopy;  Laterality: N/A;  1:00  . PORTACATH PLACEMENT Right 01/22/2017   Procedure: INSERTION PORT-A-CATH;  Surgeon: Coralie Keens, MD;  Location: [facility];  Service: General;  Laterality: Right;  . URETHRAL DILATION      Family History  Problem  Relation Age of Onset  . Hypertension Mother   . Hyperlipidemia Mother   . Heart disease Father   . Hypertension Father   . Diabetes Father   . Aneurysm Maternal Grandmother   . Stroke Maternal Grandfather   . Heart disease Paternal Grandfather   . Breast cancer Other     PGF's sister  . Breast cancer Other     PGF's sister  . Breast cancer Other     PGF's aunt (great, great aunt)  . Breast cancer Other     PGM's mother    Social History   Social History  . Marital status: Married    Spouse name: [name]  . Number of children: 3  . Years of education: N/A   Social History Main Topics  . Smoking status: Former Smoker    Quit date: 07/23/2008  . Smokeless tobacco: Never Used     Comment: quit smoking 07/23/2008 after smoking 30 yrs. (1/2 pack a day)  . Alcohol use 0.0 oz/week     Comment: rare  . Drug use: No  .  Sexual activity: Yes   Other Topics Concern  . None   Social History Narrative  . None     PHYSICAL EXAMINATION  ECOG PERFORMANCE STATUS: 0 - Asymptomatic  Vitals:   02/20/17 1131  BP: 118/76  Pulse: 87  Resp: 16  Temp: 98.6 F (37 C)   Filed Weights   02/20/17 1131  Weight: 204 lb 4.8 oz (92.7 kg)     Physical Exam  Constitutional: She is oriented to person, place, and time and well-developed, well-nourished, and in no distress.  HENT:  Head: Normocephalic and atraumatic.  Eyes: Conjunctivae and EOM are normal. Pupils are equal, round, and reactive to light.  Neck: Normal range of motion. Neck supple.  Cardiovascular: Normal rate, regular rhythm and normal heart sounds.   Pulmonary/Chest: Effort normal and breath sounds normal.  Abdominal: Soft. Bowel sounds are normal.  Musculoskeletal: Normal range of motion.  Neurological: She is alert and oriented to person, place, and time. Gait normal.  Skin: Skin is warm and dry.  Nursing note and vitals reviewed.    LABORATORY DATA: CBC    Component Value Date/Time   WBC 1.5 (L) 02/20/2017 1108   RBC 4.44 02/20/2017 1108   HGB 13.3 02/20/2017 1108   HCT 41.2 02/20/2017 1108   PLT 171 02/20/2017 1108   MCV 92.8 02/20/2017 1108   MCV 90.2 07/02/2013 1523   MCH 30.0 02/20/2017 1108   MCHC 32.3 02/20/2017 1108   RDW 13.1 02/20/2017 1108   LYMPHSABS PENDING 02/20/2017 1108   MONOABS PENDING 02/20/2017 1108   EOSABS PENDING 02/20/2017 1108   BASOSABS PENDING 02/20/2017 1108      Chemistry      Component Value Date/Time   NA 134 (L) 02/20/2017 1108   NA 144 10/25/2016 0910   K 4.1 02/20/2017 1108   CL 99 (L) 02/20/2017 1108   CO2 29 02/20/2017 1108   BUN 15 02/20/2017 1108   BUN 13 10/25/2016 0910   CREATININE 0.78 02/20/2017 1108   CREATININE 0.78 07/02/2013 1448      Component Value Date/Time   CALCIUM 9.2 02/20/2017 1108   ALKPHOS 82 02/20/2017 1108   AST 13 (L) 02/20/2017 1108   ALT 14 02/20/2017  1108   BILITOT 0.9 02/20/2017 1108   BILITOT 0.9 10/25/2016 0910        PENDING LABS:   RADIOGRAPHIC STUDIES:  NUCLEAR MEDICINE CARDIAC BLOOD  POOL IMAGING ([facility]) 02/10/2017  IMPRESSION: LEFT ventricular ejection fraction of 54%.  No focal wall motion abnormalities.  CT CHEST, ABDOMEN, AND PELVIS WITH CONTRAST  02/12/2017  IMPRESSION: 1. Postoperative changes of recent left lumpectomy and left axillary lymph node dissection, without evidence of metastatic disease. 2. Aortic atherosclerosis (ICD10-170.0). Coronary artery calcification. 3. Liver appears fatty.     PATHOLOGY:           ASSESSMENT AND PLAN:  Stage IIIA (pT2pN1AM0) left breast cancer in the lower-outer quadrant, ER/PR/HER2 NEGATIVE, S/P left lumpectomy with positive margins by Dr. Ninfa Linden on 01/22/2017 with one positive left axillary node with positive for metastatic disease resulting in a re-excision on 01/30/2017 and left axillary lymph node dissection with invasive component focally 0.1-0.2 cm away from anterior margin but inked margin surface is negative and 0/6 benign lymph nodes.  Currently on adjuvant systemic chemotherapy with AC+ T.  PLAN: She experienced numbness for days after her chemotherapy, which I believe may be related to the decadron that she took for 3 days post chemo. I have advised her to cut down to 33m of decadron instead of the 8 mg as prescribed for cycle 2. If her numbness symptoms persists, we will need to remove decadron altogether.   RTC on her next cycle which is 02/26/2017.   THERAPY: Adjuvant chemotherapy with AC + T followed by XRT therapy.    All questions were answered. The patient knows to call the clinic with any problems, questions or concerns. We can certainly see the patient much sooner if necessary.  This document serves as a record of services personally performed by LTwana First MD. It was created on her behalf by SShirlean Mylar a trained medical scribe.  The creation of this record is based on the scribe's personal observations and the provider's statements to them. This document has been checked and approved by the attending provider.  I have reviewed the above documentation for accuracy and completeness and I agree with the above.  This note is electronically signed by: SMikey College3/07/2017 11:49 AM

## 2017-02-20 NOTE — Patient Instructions (Addendum)
[facility] at [facility] Discharge Instructions  RECOMMENDATIONS MADE BY THE CONSULTANT AND ANY TEST RESULTS WILL BE SENT TO YOUR REFERRING PHYSICIAN.  You saw Dr. Talbert Cage today. Be sure to follow Dr. Laverle Patter instructions regarding the dosage of your dexamethasone. Follow up with Tom the same day as cycle 2. See Amy at checkout for appointments.  Thank you for choosing [facility] at [facility] to provide your oncology and hematology care.  To afford each patient quality time with our provider, please arrive at least 15 minutes before your scheduled appointment time.    If you have a lab appointment with the [facility] please come in thru the  Main Entrance and check in at the main information desk  You need to re-schedule your appointment should you arrive 10 or more minutes late.  We strive to give you quality time with our providers, and arriving late affects you and other patients whose appointments are after yours.  Also, if you no show three or more times for appointments you may be dismissed from the clinic at the providers discretion.     Again, thank you for choosing [facility]  Our hope is that these requests will decrease the amount of time that you wait before being seen by our physicians.       _____________________________________________________________  Should you have questions after your visit to [facility], please contact our office at [phone] between the hours of 8:30 a.m. and 4:30 p.m.  Voicemails left after 4:30 p.m. will not be returned until the following business day.  For prescription refill requests, have your pharmacy contact our office.       Resources For Cancer Patients and their Caregivers ? American Cancer Society: Can assist with transportation, wigs, general needs, runs Look Good Feel Better.        534-156-1609 ? Cancer Care: Provides financial assistance, online  support groups, medication/co-pay assistance.  1-800-813-HOPE (434) 674-6510) ? [place] Assists [place] cancer patients and their families through emotional , educational and financial support.  812-711-2613 ? [place] DSS Where to apply for food stamps, Medicaid and utility assistance. (801)588-1803 ? [facility]: Transportation to medical appointments. (270) 306-0142 ? Social Security Administration: May apply for disability if have a Stage IV cancer. 5645158036 641-821-8587 ? [employer], Disability and Transit Services: Assists with nutrition, care and transit needs. [place] Support Programs: @10RELATIVEDAYS @ > Cancer Support Group  2nd Tuesday of the month 1pm-2pm, Journey Room  > Creative Journey  3rd Tuesday of the month 1130am-1pm, Journey Room  > Look Good Feel Better  1st Wednesday of the month 10am-12 noon, Journey Room (Call Roy to register [phone])

## 2017-02-21 LAB — PATHOLOGIST SMEAR REVIEW

## 2017-02-25 ENCOUNTER — Encounter: Payer: Self-pay | Admitting: *Deleted

## 2017-02-25 ENCOUNTER — Encounter (HOSPITAL_COMMUNITY): Payer: Self-pay

## 2017-02-25 ENCOUNTER — Encounter (HOSPITAL_COMMUNITY): Payer: Self-pay | Admitting: Emergency Medicine

## 2017-02-25 ENCOUNTER — Encounter (HOSPITAL_COMMUNITY): Payer: BLUE CROSS/BLUE SHIELD

## 2017-02-25 ENCOUNTER — Other Ambulatory Visit (HOSPITAL_COMMUNITY): Payer: Self-pay | Admitting: Emergency Medicine

## 2017-02-25 ENCOUNTER — Ambulatory Visit (HOSPITAL_COMMUNITY): Payer: BLUE CROSS/BLUE SHIELD | Attending: Oncology

## 2017-02-25 ENCOUNTER — Encounter: Payer: Self-pay | Admitting: Dietician

## 2017-02-25 DIAGNOSIS — C50912 Malignant neoplasm of unspecified site of left female breast: Secondary | ICD-10-CM | POA: Diagnosis not present

## 2017-02-25 MED ORDER — FIRST-DUKES MOUTHWASH MT SUSP: OROMUCOSAL | 2 refills | Status: DC

## 2017-02-25 NOTE — Progress Notes (Signed)
[facility] Psychosocial Distress Screening Clinical Social Work  Clinical Social Work was referred by distress screening protocol.  The patient scored a 10 on the Psychosocial Distress Thermometer which indicates severe distress. Clinical Education officer, museum met with pt at [facility] to assess for distress and other psychosocial needs. Pt reported many issues related to her first chemo. These have since been addressed by the medical team, but happened in the last week and were distressing for her. Pt is hopeful that her treatment regimen will be changed up some and will have less side affects going forward. Pt reports to have positive supports from her husband and 46 yo daughter. 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 Pt denies financial or practical concerns related to her cancer diagnosis. CSW reviewed role of CSW, Support Programs and options for additional support. Pt denies other needs and agrees to reach out as needed.     ONCBCN DISTRESS SCREENING 02/25/2017  Screening Type Initial Screening  Distress experienced in past week (1-10) 10  Practical problem type Food  Emotional problem type Nervousness/Anxiety;Adjusting to illness  Physical Problem type Nausea/vomiting;Getting around;Bathing/dressing;Breathing;Mouth sores/swallowing;Loss of appetitie;Tingling hands/feet  Physician notified of physical symptoms Yes  Referral to support programs Yes   Clinical Social Worker follow up needed: No.  If yes, follow up plan:  Loren Racer, [place], OSW-C [place] Tuesdays   [phone]

## 2017-02-25 NOTE — Progress Notes (Signed)
Pt at [facility] clinic this am.  Pt complaining of mouth sores after chemotherapy.  Spoke with Kirby Crigler PA and [facility] mouth wash called in for the pt.

## 2017-02-25 NOTE — Patient Instructions (Addendum)
Patient was instructed today in home exercise program today for shoulder range of motion. These included active assisted shoulder flexion in sitting, scapular retraction, wall walking with shoulder abduction, and hands behind head external rotation. They were encouraged to do these twice a day, holding 3 seconds and repeating 5 times when permitted by their physician.

## 2017-02-25 NOTE — Therapy (Signed)
[place], [place], [address] Phone: [phone]   Fax:  [phone]  [facility] Evaluation  Patient Details  Name: Shannon Logan MRN: [id number] Date of Birth: [birth year] Referring Provider: Robynn Pane, PA-C  Encounter Date: 02/25/2017      OT End of Session - 02/25/17 1038    Visit Number 1   Number of Visits 1   Authorization Type BCBS    Authorization Time Period no visit limit   OT Start Time 0903   OT Stop Time 0925   OT Time Calculation (min) 22 min   Activity Tolerance Patient tolerated treatment well   Behavior During Therapy [facility] for tasks assessed/performed      Past Medical History:  Diagnosis Date  . Breast cancer of lower-outer quadrant of left female breast ([place]) 01/03/2017  . Cancer ([place]) 11/22/2016   Breast   . Family history of breast cancer   . GERD (gastroesophageal reflux disease)     Past Surgical History:  Procedure Laterality Date  . BREAST LUMPECTOMY WITH AXILLARY LYMPH NODE DISSECTION Left 01/30/2017   Procedure: RE-EXCISION OF LEFT BREAST LUMPECTOMY AND LEFT AXILLARY LYMPH NODE DISSECTION;  Surgeon: Coralie Keens, MD;  Location: [place];  Service: General;  Laterality: Left;  . BREAST LUMPECTOMY WITH RADIOACTIVE SEED AND SENTINEL LYMPH NODE BIOPSY Left 01/22/2017   Procedure: BREAST LUMPECTOMY WITH RADIOACTIVE SEED AND SENTINEL LYMPH NODE BIOPSY;  Surgeon: Coralie Keens, MD;  Location: [place];  Service: General;  Laterality: Left;  . COLONOSCOPY N/A 10/26/2015   Procedure: COLONOSCOPY;  Surgeon: Rogene Houston, MD;  [facility];  Service: Endoscopy;  Laterality: N/A;  . DILATION AND CURETTAGE OF UTERUS    . ESOPHAGOGASTRODUODENOSCOPY N/A 10/26/2015   Procedure: ESOPHAGOGASTRODUODENOSCOPY (EGD);  Surgeon: Rogene Houston, MD;  [facility];  Service: Endoscopy;  Laterality: N/A;   1:00  . PORTACATH PLACEMENT Right 01/22/2017   Procedure: INSERTION PORT-A-CATH;  Surgeon: Coralie Keens, MD;  Location: [facility];  Service: General;  Laterality: Right;  . URETHRAL DILATION      There were no vitals filed for this visit.      Subjective Assessment - 02/25/17 0928    Subjective  S: The first cycle of Chemo really wore me out.    Pertinent History Patient was diagnosed on 11/22/17 with Stage IIIA left breast cancer; left lower quadrant ER/PR/HER2Neg. Patient has finished one treatment of chemotherapy with plans for completing a total of 15. Patient reports that she has had a left lumpectomy and approximately 7-8 lymph nodes removed from her left armpit.    Patient Stated Goals Reduce Lymphedema risk and learn shoulder ROM HEP.   Currently in Pain? Yes   Pain Score 2    Pain Location Axilla   Pain Orientation Left   Pain Descriptors / Indicators Sore   Pain Type Acute pain;Surgical pain   Pain Radiating Towards N/A   Pain Onset 1 to 4 weeks ago   Pain Frequency Intermittent   Aggravating Factors  unknown   Pain Relieving Factors unknown   Effect of Pain on Daily Activities none   Multiple Pain Sites No           [facility] OT Assessment - 02/25/17 0935      Assessment   Diagnosis Left breast cancer   Referring Provider Robynn Pane, PA-C   Onset Date 11/22/16   Prior Therapy  None     Precautions   Precautions Other (comment)  active cancer     Restrictions   Weight Bearing Restrictions No     Balance Screen   Has the patient fallen in the past 6 months No     Home  Environment   Family/patient expects to be discharged to: Private residence   Living Arrangements Spouse/significant other     Prior Function   Level of Independence Independent   Vocation Unemployed   Vocation Requirements Pt was currently working as Copywriter, 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   Leisure Pt reports that she would walk daily  1-3 miles per day if her plantar fascitis flares up she is unable to walk a lot.      Mobility   Mobility Status Independent     Written Expression   Dominant Hand Right     Cognition   Overall Cognitive Status Within Functional Limits for tasks assessed     ROM / Strength   AROM / PROM / Strength AROM;Strength     AROM   Overall AROM Comments Cervical A/ROM is WNL.   AROM Assessment Site Shoulder   Right/Left Shoulder Left   Left Shoulder Flexion 150 Degrees   Left Shoulder ABduction 140 Degrees   Left Shoulder Internal Rotation 70 Degrees   Left Shoulder External Rotation 85 Degrees     Strength   Overall Strength Comments Shoulder strength is 5/5 in all ranges.           LYMPHEDEMA/ONCOLOGY QUESTIONNAIRE - 02/25/17 0942      Type   Cancer Type Left breast cancer     Surgeries   Lumpectomy Date 01/22/17     Treatment   Active Chemotherapy Treatment Yes     What other symptoms do you have   Are you Having Heaviness or Tightness No   Are you having Pain Yes   Are you having pitting edema No   Is it Hard or Difficult finding clothes that fit No     Lymphedema Assessments   Lymphedema Assessments Upper extremities     Right Upper Extremity Lymphedema   10 cm Proximal to Olecranon Process 34 cm   Olecranon Process 28 cm   10 cm Proximal to Ulnar Styloid Process 24 cm   Just Proximal to Ulnar Styloid Process 16 cm   Across Hand at [employer] 18.5 cm   At [place] of 2nd Digit 6.5 cm     Left Upper Extremity Lymphedema   10 cm Proximal to Olecranon Process 35 cm   Olecranon Process 28.5 cm   10 cm Proximal to Ulnar Styloid Process 24 cm   Just Proximal to Ulnar Styloid Process 16 cm   Across Hand at [employer] 19 cm   At [place] of 2nd Digit 6.5 cm                       OT Education - 02/25/17 1036    Education provided Yes   Education Details Lymphedema risk reduction and shoulder ROM HEP   Person(s) Educated Patient   Methods  Explanation;Handout;Verbal cues   Comprehension Verbalized understanding          OT Short Term Goals - 02/25/17 1050      OT SHORT TERM GOAL #1   Title patient will verbalize understanding of pertinent lymphedema risk reduction practices relevant to her diagnosis specifically related to skin care.  Time 1   Period Days   Status Achieved     OT SHORT TERM GOAL #2   Title Patient will be able to return demonstration and/or verbalize understanding of the home exercise program related to shoulder range of motion.   Time 1   Period Days   Status Achieved                  Plan - 02/25/17 1039    Clinical Impression Statement A: Patient was diagnosed on 11/22/17 with Stage IIIA left breast cancer; left lower quadrant ER/PR/HER2Neg. Patient has finished one treatment of chemotherapy with plans for completing a total of 15. Patient reports that she has had a left lumpectomy and approximately 7-8 lymph nodes removed from her left armpit. Her multidisciplinary medical team met prior to her assessments to discuss her treatment plan. Patient has currently finished 1 cycle of of chemotherapy . She may benefit from OT services to be educated on shoulder ROM HEP and reduce lymphedema risk. Due to her lack of cormorbidities, her eval is of low complexity.    Rehab Potential Excellent   OT Frequency One time visit   OT Treatment/Interventions Patient/family education   Plan P: Follow up as needed.    Consulted and Agree with Plan of Care Patient      Patient will benefit from skilled therapeutic intervention in order to improve the following deficits and impairments:  Pain, Impaired UE functional use, Decreased range of motion, Decreased knowledge of precautions  Visit Diagnosis: Ductal carcinoma of left breast ([place]) - Plan: Ot plan of care cert/re-cert   Patient will follow up at outpatient cancer clinic rehab if needed. If the patient requires physical/occupational therapy at that  time, a specific plan will be dictated and set to the referring physician for approval. The patient was educated today on appropriate basic range of motion exercises to begin to increase shoulder ROM.  Patient was educated today on lymphedema risk reduction practices as it pertains to recommendations that will benefit the patient immediately following surgery. She verbalized good understanding. No additional occupational therapy is indicated at this time.   Problem List Patient Active Problem List   Diagnosis Date Noted  . Genetic testing 01/20/2017  . Breast cancer of lower-outer quadrant of left female breast ([place]) 01/03/2017  . Family history of breast cancer   . Prediabetes 10/10/2016  . Hypertriglyceridemia 04/05/2016  . BMI 32.0-32.9,adult 09/22/2015  . Abdominal pain, right upper quadrant 07/11/2015   Ailene Ravel, OTR/L,CBIS  [phone]  02/25/2017, 10:57 AM  [place], [place], [address] Phone: [phone]   Fax:  [phone]  Name: Shannon Logan MRN: [id number] Date of Birth: March 04, 1966

## 2017-02-25 NOTE — Progress Notes (Signed)
Met with patient as part of the Multidisciplinary Clinic  She is undergoing chemo for Stage 3 breast Cancer  Wt Readings from Last 10 Encounters:  02/20/17 204 lb 4.8 oz (92.7 kg)  02/13/17 212 lb (96.2 kg)  02/05/17 210 lb (95.3 kg)  01/30/17 208 lb 2 oz (94.4 kg)  01/22/17 207 lb (93.9 kg)  01/09/17 204 lb 11.2 oz (92.9 kg)  01/03/17 205 lb 9.6 oz (93.3 kg)  01/03/17 208 lb 9.6 oz (94.6 kg)  11/15/16 198 lb 3.2 oz (89.9 kg)  10/25/16 195 lb (88.5 kg)   Patient weight has been fairly stable. It does vary up/down by 5 or so lbs.   At baseline the patient eats 3 meals/day, does not drink oral nutritional supplements and does not take any vitamins or minerals.  Patient has undergone one chemo infusion. She says this caused her significant side effects x 1 week including a severe loss of appetite, extreme sensitivity to smell, nausea and intermittent constipation/diarrhea. During this time, she says her portion sizes decreased significantly. She would eat only foods such as nabs, crackers, pudding, cheerios. She had trouble keeping hydrated as water felt "heavy". She has tried many anti-nausea medications, but none of which have had a major affect. She claims to have lost 9 lbs from this week, but states she regained it all the following week once she felt better.   RD spent time going through specific foods that may worsen or improve her nausea. Discussed the impact that fat can have on nausea. Emphasized eating small frequent meals as opposed to large ones. Recommended beverages to help prevent dehydration and told which ones may worsen her diarrhea.   We talked about some other ways of managing her nausea ie sea bands, ginger extract, using a mouth wash. She says she has been using Spry toothpaste which has helped.   Despite her 1 week of side effects, she reports doing fairly well and has made quite a few adjustments that have helped her manage her symptoms.   Advised that RD is always  available if she continues to have a very hard time managing her side effects or loses weight because of them.   Left handout titled "Nausea and Vomiting"  Burtis Junes RD, LDN, [facility] Pager: [phone] 02/25/2017 11:19 AM

## 2017-02-27 ENCOUNTER — Ambulatory Visit (HOSPITAL_COMMUNITY): Payer: BLUE CROSS/BLUE SHIELD

## 2017-02-27 ENCOUNTER — Encounter (HOSPITAL_COMMUNITY): Payer: Self-pay | Admitting: Oncology

## 2017-02-27 ENCOUNTER — Telehealth (HOSPITAL_COMMUNITY): Payer: Self-pay | Admitting: Oncology

## 2017-02-27 ENCOUNTER — Encounter (HOSPITAL_BASED_OUTPATIENT_CLINIC_OR_DEPARTMENT_OTHER): Payer: BLUE CROSS/BLUE SHIELD

## 2017-02-27 ENCOUNTER — Encounter (HOSPITAL_BASED_OUTPATIENT_CLINIC_OR_DEPARTMENT_OTHER): Payer: BLUE CROSS/BLUE SHIELD | Admitting: Oncology

## 2017-02-27 VITALS — BP 123/66 | HR 73 | Temp 97.5°F | Resp 18 | Wt 210.8 lb

## 2017-02-27 DIAGNOSIS — C50512 Malignant neoplasm of lower-outer quadrant of left female breast: Secondary | ICD-10-CM

## 2017-02-27 DIAGNOSIS — R110 Nausea: Secondary | ICD-10-CM | POA: Diagnosis not present

## 2017-02-27 DIAGNOSIS — C773 Secondary and unspecified malignant neoplasm of axilla and upper limb lymph nodes: Secondary | ICD-10-CM | POA: Diagnosis not present

## 2017-02-27 DIAGNOSIS — Z5189 Encounter for other specified aftercare: Secondary | ICD-10-CM

## 2017-02-27 DIAGNOSIS — Z171 Estrogen receptor negative status [ER-]: Principal | ICD-10-CM

## 2017-02-27 DIAGNOSIS — Z5111 Encounter for antineoplastic chemotherapy: Secondary | ICD-10-CM

## 2017-02-27 LAB — COMPREHENSIVE METABOLIC PANEL
ALBUMIN: 3.7 g/dL (ref 3.5–5.0)
ALK PHOS: 79 U/L (ref 38–126)
ALT: 24 U/L (ref 14–54)
ANION GAP: 8 (ref 5–15)
AST: 21 U/L (ref 15–41)
BILIRUBIN TOTAL: 0.5 mg/dL (ref 0.3–1.2)
BUN: 13 mg/dL (ref 6–20)
CALCIUM: 9.3 mg/dL (ref 8.9–10.3)
CO2: 26 mmol/L (ref 22–32)
Chloride: 104 mmol/L (ref 101–111)
Creatinine, Ser: 0.72 mg/dL (ref 0.44–1.00)
GFR calc Af Amer: >60 mL/min (ref >60)
GFR calc non Af Amer: >60 mL/min (ref >60)
GLUCOSE: 140 mg/dL — AB (ref 65–99)
Potassium: 3.9 mmol/L (ref 3.5–5.1)
Sodium: 138 mmol/L (ref 135–145)
TOTAL PROTEIN: 6.8 g/dL (ref 6.5–8.1)

## 2017-02-27 LAB — CBC WITH DIFFERENTIAL/PLATELET
BASOS PCT: 0 %
Basophils Absolute: 0 10*3/uL (ref 0.0–0.1)
EOS ABS: 0 10*3/uL (ref 0.0–0.7)
EOS PCT: 0 %
HCT: 38 % (ref 36.0–46.0)
HEMOGLOBIN: 12.7 g/dL (ref 12.0–15.0)
Lymphocytes Relative: 25 %
Lymphs Abs: 2.1 10*3/uL (ref 0.7–4.0)
MCH: 31.1 pg (ref 26.0–34.0)
MCHC: 33.4 g/dL (ref 30.0–36.0)
MCV: 93.1 fL (ref 78.0–100.0)
Monocytes Absolute: 0.6 10*3/uL (ref 0.1–1.0)
Monocytes Relative: 7 %
NEUTROS ABS: 5.5 10*3/uL (ref 1.7–7.7)
Neutrophils Relative %: 68 %
Platelets: 222 10*3/uL (ref 150–400)
RBC: 4.08 MIL/uL (ref 3.87–5.11)
RDW: 13.9 % (ref 11.5–15.5)
WBC: 8.2 10*3/uL (ref 4.0–10.5)

## 2017-02-27 MED ORDER — SODIUM CHLORIDE 0.9 % IV SOLN
600.0000 mg/m2 | Freq: Once | INTRAVENOUS | Status: AC
  Administered 2017-02-27: 1260 mg via INTRAVENOUS
  Filled 2017-02-27: qty 50

## 2017-02-27 MED ORDER — PEGFILGRASTIM 6 MG/0.6ML ~~LOC~~ PSKT
6.0000 mg | PREFILLED_SYRINGE | Freq: Once | SUBCUTANEOUS | Status: AC
  Administered 2017-02-27: 6 mg via SUBCUTANEOUS
  Filled 2017-02-27: qty 0.6

## 2017-02-27 MED ORDER — HEPARIN SOD (PORK) LOCK FLUSH 100 UNIT/ML IV SOLN
INTRAVENOUS | Status: AC
  Filled 2017-02-27: qty 5

## 2017-02-27 MED ORDER — SODIUM CHLORIDE 0.9 % IV SOLN
Freq: Once | INTRAVENOUS | Status: AC
  Administered 2017-02-27: 10:00:00 via INTRAVENOUS

## 2017-02-27 MED ORDER — SODIUM CHLORIDE 0.9 % IV SOLN
Freq: Once | INTRAVENOUS | Status: AC
  Administered 2017-02-27: 10:00:00 via INTRAVENOUS
  Filled 2017-02-27: qty 5

## 2017-02-27 MED ORDER — DOXORUBICIN HCL CHEMO IV INJECTION 2 MG/ML
60.0000 mg/m2 | Freq: Once | INTRAVENOUS | Status: AC
  Administered 2017-02-27: 126 mg via INTRAVENOUS
  Filled 2017-02-27: qty 63

## 2017-02-27 MED ORDER — HEPARIN SOD (PORK) LOCK FLUSH 100 UNIT/ML IV SOLN
500.0000 [IU] | Freq: Once | INTRAVENOUS | Status: AC | PRN
  Administered 2017-02-27: 500 [IU]

## 2017-02-27 MED ORDER — FAMOTIDINE IN NACL 20-0.9 MG/50ML-% IV SOLN
20.0000 mg | Freq: Once | INTRAVENOUS | Status: AC
  Administered 2017-02-27: 20 mg via INTRAVENOUS

## 2017-02-27 MED ORDER — FAMOTIDINE IN NACL 20-0.9 MG/50ML-% IV SOLN
INTRAVENOUS | Status: AC
  Filled 2017-02-27: qty 50

## 2017-02-27 MED ORDER — PALONOSETRON HCL INJECTION 0.25 MG/5ML
0.2500 mg | Freq: Once | INTRAVENOUS | Status: AC
  Administered 2017-02-27: 0.25 mg via INTRAVENOUS
  Filled 2017-02-27: qty 5

## 2017-02-27 NOTE — Patient Instructions (Signed)
[facility] Discharge Instructions for Patients Receiving Chemotherapy   Beginning January 23rd 2017 lab work for the [facility] will be done in the  Main lab at [facility] on 1st floor. If you have a lab appointment with the [facility] please come in thru the  Main Entrance and check in at the main information desk   Today you received the following chemotherapy agents adriamycin and cyclophosphamide. Neulasta device right upper arm will dispense medication between 245 pm and 345 pm tomorrow. You may take device off after 345 pm tomorrow Friday March 16.  To help prevent nausea and vomiting after your treatment, we encourage you to take your nausea medication as instructed.  If you develop nausea and vomiting, or diarrhea that is not controlled by your medication, call the clinic.  The clinic phone number is [phone]. Office hours are Monday-Friday 8:30am-5:00pm.  BELOW ARE SYMPTOMS THAT SHOULD BE REPORTED IMMEDIATELY:  *FEVER GREATER THAN 101.0 F  *CHILLS WITH OR WITHOUT FEVER  NAUSEA AND VOMITING THAT IS NOT CONTROLLED WITH YOUR NAUSEA MEDICATION  *UNUSUAL SHORTNESS OF BREATH  *UNUSUAL BRUISING OR BLEEDING  TENDERNESS IN MOUTH AND THROAT WITH OR WITHOUT PRESENCE OF ULCERS  *URINARY PROBLEMS  *BOWEL PROBLEMS  UNUSUAL RASH Items with * indicate a potential emergency and should be followed up as soon as possible. If you have an emergency after office hours please contact your primary care physician or go to the nearest emergency department.  Please call the clinic during office hours if you have any questions or concerns.   You may also contact the Patient Navigator at [phone] should you have any questions or need assistance in obtaining follow up care.      Resources For Cancer Patients and their Caregivers ? American Cancer Society: Can assist with transportation, wigs, general needs, runs Look Good Feel Better.         813-383-7533 ? Cancer Care: Provides financial assistance, online support groups, medication/co-pay assistance.  1-800-813-HOPE 859-621-4083) ? [facility]ists [place] cancer patients and their families through emotional , educational and financial support.  3642182666 ? [place] DSS Where to apply for food stamps, Medicaid and utility assistance. 346-687-9800 ? RCATS: Transportation to medical appointments. (332) 516-9799 ? Social Security Administration: May apply for disability if have a Stage IV cancer. 442-014-1783 208-250-6872 ? [employer], Disability and Transit Services: Assists with nutrition, care and transit needs. 223-296-4720

## 2017-02-27 NOTE — Progress Notes (Signed)
Tolerated chemo well today. States she feels much better today than she did at the end of her first treatment. Marland KitchenArlana Pouch arrived today for [facility] neulasta on body injector. See MAR for administration details. Injector in place and engaged with green light indicator on flashing. Tolerated application with out problems. Stable on discharge home with mother via wheelchair.

## 2017-02-27 NOTE — Patient Instructions (Signed)
[place] at [facility] Discharge Instructions  RECOMMENDATIONS MADE BY THE CONSULTANT AND ANY TEST RESULTS WILL BE SENT TO YOUR REFERRING PHYSICIAN.  You were seen today by Kirby Crigler PA-C. Treatment today and again in 2 weeks. Call if nauseated or if having issues with H2O intake. Follow up 2 weeks as scheduled.   Thank you for choosing [facility] at [facility] to provide your oncology and hematology care.  To afford each patient quality time with our provider, please arrive at least 15 minutes before your scheduled appointment time.    If you have a lab appointment with the [facility] please come in thru the  Main Entrance and check in at the main information desk  You need to re-schedule your appointment should you arrive 10 or more minutes late.  We strive to give you quality time with our providers, and arriving late affects you and other patients whose appointments are after yours.  Also, if you no show three or more times for appointments you may be dismissed from the clinic at the providers discretion.     Again, thank you for choosing [facility].  Our hope is that these requests will decrease the amount of time that you wait before being seen by our physicians.       _____________________________________________________________  Should you have questions after your visit to [facility], please contact our office at [phone] between the hours of 8:30 a.m. and 4:30 p.m.  Voicemails left after 4:30 p.m. will not be returned until the following business day.  For prescription refill requests, have your pharmacy contact our office.       Resources For Cancer Patients and their Caregivers ? American Cancer Society: Can assist with transportation, wigs, general needs, runs Look Good Feel Better.        878 300 1981 ? Cancer Care: Provides financial assistance, online support groups,  medication/co-pay assistance.  1-800-813-HOPE (971)338-4892) ? [facility]ists [place] cancer patients and their families through emotional , educational and financial support.  325-605-2165 ? [place] DSS Where to apply for food stamps, Medicaid and utility assistance. 406-316-3601 ? RCATS: Transportation to medical appointments. 574-554-2509 ? Social Security Administration: May apply for disability if have a Stage IV cancer. 970-292-5005 712-736-9394 ? [employer], Disability and Transit Services: Assists with nutrition, care and transit needs. [place] Support Programs: @10RELATIVEDAYS @ > Cancer Support Group  2nd Tuesday of the month 1pm-2pm, Journey Room  > Creative Journey  3rd Tuesday of the month 1130am-1pm, Journey Room  > Look Good Feel Better  1st Wednesday of the month 10am-12 noon, Journey Room (Call [place] to register [phone])

## 2017-02-27 NOTE — Telephone Encounter (Signed)
[facility] CARD [phone]  CSR*-KEVIN CONF# [id number] MEB ID# [id number] CARD# [id number] EXP 11/20   748

## 2017-02-27 NOTE — Assessment & Plan Note (Addendum)
Stage IIIA (pT2pN1AM0) left breast cancer in the lower-outer quadrant, ER/PR/HER2 NEGATIVE, S/P left lumpectomy with positive margins by Dr. Ninfa Linden on 01/22/2017 with one positive left axillary node with positive for metastatic disease resulting in a re-excision on 01/30/2017 and left axillary lymph node dissection with invasive component focally 0.1-0.2 cm away from anterior margin but inked margin surface is negative and 0/6 benign lymph nodes.  Pre-treatment labs CBC diff, CMET.  I personally reviewed and went over laboratory results with the patient.  The results are noted within this dictation.    She reported numbness with her last cycle at her nadir check with Dr. Talbert Cage.  Her dexamethasone was therefore decreased to 4 mg.  We will see if this helps.  If not, will discontinue dexamethasone altogether.  She reports a "allergy" to prednisone.    She reports a 9 day history of nausea with severe fatigue.  She denies any vomiting.  We changed her home antiemetic regimen.  She will call if this persists and we will add additional antiemetics if needed.  Reglan/Ativan may be good options.  She reports difficulties during these 9 days with H2O intake.  If this occurs with this cycle, patient will call and I will bring her in for IV fluids and IV antiemetics if indicated.  If nausea continues to be an issue moving forward, I would consider Varubi.  I have added 20 mg of IV Pepcid for treatment today only.  Return in 2 weeks for follow-up with treatment.

## 2017-02-27 NOTE — Progress Notes (Signed)
[name], MD [place] [address]  Malignant neoplasm of lower-outer quadrant of left breast of female, estrogen receptor negative ([place])  CURRENT THERAPY: AC beginning on 02/13/2017.  INTERVAL HISTORY: Shannon Logan 51 y.o. female returns for followup of Stage IIIA (pT2pN1AM0) left breast cancer in the lower-outer quadrant, ER/PR/HER2 NEGATIVE, S/P left lumpectomy with positive margins by Dr. Ninfa Linden on 01/22/2017 with one positive left axillary node with positive for metastatic disease resulting in a re-excision on 01/30/2017 and left axillary lymph node dissection with invasive component focally 0.1-0.2 cm away from anterior margin but inked margin surface is negative and 0/6 benign lymph nodes.    Breast cancer of lower-outer quadrant of left female breast ([place])   11/19/2016 Mammogram    In the lateral aspect of the left breast, posterior depth, there is an irregular spiculated mass measuring approximately 1.8 cm. There are a few pleomorphic calcifications in a linear distribution within the mass and extending slightly anterior to the mass. All together the mass and calcifications span approximately 2.5 cm.      11/21/2016 Procedure    Breast, left, needle core biopsy, 3:30 o'clock, 8 cm fn      11/22/2016 Pathology Results    Breast, left, needle core biopsy, 3:30 o'clock, 8 cm fn - INVASIVE DUCTAL CARCINOMA, GRADE 2.      01/03/2017 Initial Diagnosis    Breast cancer of lower-outer quadrant of left female breast ([place])     01/22/2017 Procedure    Left breast lumpectomy and left axillary sentinel node biopsy by Dr. Ninfa Linden      01/22/2017 Procedure    Port placed by Dr. Ninfa Linden      01/23/2017 Pathology Results    1. Breast, lumpectomy, Left - INVASIVE GRADE 3 DUCTAL CARCINOMA, SPANNING 2.2 CM IN GREATEST DIMENSION. 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 - LYMPH/VASCULAR INVASION IS PRESENT. - ANTERIOR / MEDIAL  MARGIN IS FOCALLY POSITIVE FOR INVASIVE DUCTAL CARCINOMA AND ANTERIOR MARGIN IS BROADLY POSITIVE FOR INVASIVE DUCTAL CARCINOMA. - DUCTAL CARCINOMA IN SITU IS FOCALLY LESS THAN 0.1 CM TO POSTERIOR MARGIN. - OTHER MARGINS ARE NEGATIVE. - SEE ONCOLOGY TEMPLATE. 2. Lymph node, sentinel, biopsy, Left axillary - ONE BENIGN LYMPH NODE WITH NO TUMOR SEEN (0/1). 3. Lymph node, sentinel, biopsy, Left axillary - ONE LYMPH NODE POSITIVE FOR METASTATIC DUCTAL CARCINOMA (1/1).      01/30/2017 Procedure    Breast, excision, Left, new anterior and medial margins and Lymph nodes, regional resection, Left axillary contents      01/31/2017 Pathology Results    Diagnosis 1. Breast, excision, Left, new anterior and medial margins - RESIDUAL INVASIVE DUCTAL CARCINOMA AND EXTENSIVE RESIDUAL DUCTAL CARCINOMA IN SITU. - INVASIVE DUCTAL CARCINOMA IS FOCALLY 0.1 TO 0.2 CM AWAY FROM ANTERIOR MARGIN BUT INKED MARGINAL SURFACE IS NEGATIVE. - DUCTAL CARCINOMA IN SITU IS FOCALLY LESS THAN 0.1 CM AWAY FROM ANTERIOR MARGIN AND FOCALLY LESS THAN 0.1 CM AWAY FROM MEDIAL ASPECT OF ANTERIOR MARGIN. - SEE COMMENT. 2. Lymph nodes, regional resection, Left axillary contents - SIX BENIGN LYMPH NODES WITH NO TUMOR SEEN (0/6).      02/10/2017 Imaging    MUGA- LEFT ventricular ejection fraction of 54%.  No focal wall motion abnormalities.       02/12/2017 Imaging    CT CAP- 1. Postoperative changes of recent left lumpectomy and left axillary lymph node dissection, without evidence of metastatic disease. 2. Aortic atherosclerosis (  ICD10-170.0). Coronary artery calcification. 3. Liver appears fatty.      02/13/2017 -  Chemotherapy    The patient had DOXOrubicin (ADRIAMYCIN) chemo injection 126 mg, 60 mg/m2 = 126 mg, Intravenous,  Once, 1 of 4 cycles  palonosetron (ALOXI) injection 0.25 mg, 0.25 mg, Intravenous,  Once, 1 of 4 cycles  pegfilgrastim (NEULASTA ONPRO KIT) injection 6 mg, 6 mg, Subcutaneous, Once, 1 of 5  cycles  cyclophosphamide (CYTOXAN) 1,260 mg in sodium chloride 0.9 % 250 mL chemo infusion, 600 mg/m2 = 1,260 mg, Intravenous,  Once, 1 of 4 cycles  PACLitaxel (TAXOL) 168 mg in dextrose 5 % 250 mL chemo infusion ( fosaprepitant (EMEND) 150 mg, dexamethasone (DECADRON) 12 mg in sodium chloride 0.9 % 145 mL IVPB, , Intravenous,  Once, 1 of 4 cycles  for chemotherapy treatment.         She is here today for cycle #2 of AC.  Her previous treatment was complicated by: 1.  Nausea without vomiting.  She was utilizing Zofran and Compazine with minimal benefit.  Phenergan was added, but by the time she got this prescription, her nausea resolved.  She will utilize Phenergan with this cycle if needed.  She reports that smells are a factor for her nausea.  Appetite is strong.  Weight is stable. 2.  Numbness.  This was presumed to be from dexamethasone.  Her dexamethasone dose has been decreased by half and will see if this helps.  If not, we would consider discontinuation of X medicine. 3.  Difficulty with H2O intake secondary to nausea.  She will call if this continues to be an issue and we will bring her in for IV fluids if necessary between cycles of treatment.  I would also consider Reglan. 4.  Severe fatigue for 9 days.  For the cycle, she is decided to change some of her medications.  She has held her Protonix today and anticipates holding it for tomorrow.  She read on the Internet where a woman with similar symptoms tried tomato juice prior to treatment and this was beneficial.  As result, she is trying this with the cycle.  She reports that has something to do with increasing acidity within the stomach.  I do not see any contraindication or issues with this but the patient is advised that its benefit is questionable.  Review of Systems  Constitutional: Positive for malaise/fatigue. Negative for chills, fever and weight loss.  HENT: Negative.   Eyes: Negative.   Respiratory: Negative.  Negative  for cough.   Cardiovascular: Negative.  Negative for chest pain.  Gastrointestinal: Positive for nausea. Negative for blood in stool, constipation, diarrhea, melena and vomiting.  Genitourinary: Negative.   Musculoskeletal: Negative.   Skin: Negative.   Neurological: Negative.  Negative for weakness.  Endo/Heme/Allergies: Negative.   Psychiatric/Behavioral: Negative.     Past Medical History:  Diagnosis Date  . Breast cancer of lower-outer quadrant of left female breast ([place]) 01/03/2017  . Cancer ([place]) 11/22/2016   Breast   . Family history of breast cancer   . GERD (gastroesophageal reflux disease)     Past Surgical History:  Procedure Laterality Date  . BREAST LUMPECTOMY WITH AXILLARY LYMPH NODE DISSECTION Left 01/30/2017   Procedure: RE-EXCISION OF LEFT BREAST LUMPECTOMY AND LEFT AXILLARY LYMPH NODE DISSECTION;  Surgeon: Coralie Keens, MD;  Location: [facility];  Service: General;  Laterality: Left;  . BREAST LUMPECTOMY WITH RADIOACTIVE SEED AND SENTINEL LYMPH NODE BIOPSY Left 01/22/2017   Procedure: BREAST  LUMPECTOMY WITH RADIOACTIVE SEED AND SENTINEL LYMPH NODE BIOPSY;  Surgeon: Coralie Keens, MD;  Location: [facility];  Service: General;  Laterality: Left;  . COLONOSCOPY N/A 10/26/2015   Procedure: COLONOSCOPY;  Surgeon: Rogene Houston, MD;  [facility];  Service: Endoscopy;  Laterality: N/A;  . DILATION AND CURETTAGE OF UTERUS    . ESOPHAGOGASTRODUODENOSCOPY N/A 10/26/2015   Procedure: ESOPHAGOGASTRODUODENOSCOPY (EGD);  Surgeon: Rogene Houston, MD;  [facility];  Service: Endoscopy;  Laterality: N/A;  1:00  . PORTACATH PLACEMENT Right 01/22/2017   Procedure: INSERTION PORT-A-CATH;  Surgeon: Coralie Keens, MD;  Location: [facility];  Service: General;  Laterality: Right;  . URETHRAL DILATION      Family History  Problem Relation Age of Onset  . Hypertension Mother   . Hyperlipidemia Mother   .  Heart disease Father   . Hypertension Father   . Diabetes Father   . Aneurysm Maternal Grandmother   . Stroke Maternal Grandfather   . Heart disease Paternal Grandfather   . Breast cancer Other     PGF's sister  . Breast cancer Other     PGF's sister  . Breast cancer Other     PGF's aunt (great, great aunt)  . Breast cancer Other     PGM's mother    Social History   Social History  . Marital status: Married    Spouse name: [name]  . Number of children: 3  . Years of education: N/A   Social History Main Topics  . Smoking status: Former Smoker    Quit date: 07/23/2008  . Smokeless tobacco: Never Used     Comment: quit smoking 07/23/2008 after smoking 30 yrs. (1/2 pack a day)  . Alcohol use 0.0 oz/week     Comment: rare  . Drug use: No  . Sexual activity: Yes   Other Topics Concern  . None   Social History Narrative  . None     PHYSICAL EXAMINATION  ECOG PERFORMANCE STATUS: 0 - Asymptomatic  There were no vitals filed for this visit.  Vitals - 1 value per visit 7/34/1938  SYSTOLIC 902  DIASTOLIC 66  Pulse 92  Temperature 98.1  Respirations 18  Weight (lb) 210.8    GENERAL:alert, no distress, well nourished, well developed, comfortable, cooperative, obese, smiling and accompanied by mother, in chemo-recliner. SKIN: skin color, texture, turgor are normal, no rashes or significant lesions HEAD: Normocephalic, No masses, lesions, tenderness or abnormalities EYES: normal EARS: External ears normal OROPHARYNX:lips, buccal mucosa, and tongue normal and mucous membranes are moist  NECK: supple, trachea midline LYMPH:  not examined BREAST:not examined LUNGS: clear to auscultation  HEART: regular rate & rhythm ABDOMEN:abdomen soft, obese and normal bowel sounds BACK: Back symmetric, no curvature. EXTREMITIES:less then 2 second capillary refill, no joint deformities, effusion, or inflammation, no skin discoloration, no cyanosis  NEURO: alert & oriented x 3 with fluent  speech, no focal motor/sensory deficits, gait normal   LABORATORY DATA: CBC    Component Value Date/Time   WBC 8.2 02/27/2017 0850   RBC 4.08 02/27/2017 0850   HGB 12.7 02/27/2017 0850   HCT 38.0 02/27/2017 0850   PLT 222 02/27/2017 0850   MCV 93.1 02/27/2017 0850   MCV 90.2 07/02/2013 1523   MCH 31.1 02/27/2017 0850   MCHC 33.4 02/27/2017 0850   RDW 13.9 02/27/2017 0850   LYMPHSABS 2.1 02/27/2017 0850   MONOABS 0.6 02/27/2017 0850   EOSABS 0.0 02/27/2017 0850  BASOSABS 0.0 02/27/2017 0850      Chemistry      Component Value Date/Time   NA 138 02/27/2017 0850   NA 144 10/25/2016 0910   K 3.9 02/27/2017 0850   CL 104 02/27/2017 0850   CO2 26 02/27/2017 0850   BUN 13 02/27/2017 0850   BUN 13 10/25/2016 0910   CREATININE 0.72 02/27/2017 0850   CREATININE 0.78 07/02/2013 1448      Component Value Date/Time   CALCIUM 9.3 02/27/2017 0850   ALKPHOS 79 02/27/2017 0850   AST 21 02/27/2017 0850   ALT 24 02/27/2017 0850   BILITOT 0.5 02/27/2017 0850   BILITOT 0.9 10/25/2016 0910        PENDING LABS:   RADIOGRAPHIC STUDIES:  Ct Chest W Contrast  Result Date: 02/12/2017 CLINICAL DATA:  New diagnosis left breast cancer with lumpectomy, staging. EXAM: CT CHEST, ABDOMEN, AND PELVIS WITH CONTRAST TECHNIQUE: Multidetector CT imaging of the chest, abdomen and pelvis was performed following the standard protocol during bolus administration of intravenous contrast. CONTRAST:  142m ISOVUE-300 IOPAMIDOL (ISOVUE-300) INJECTION 61% COMPARISON:  None. FINDINGS: CT CHEST FINDINGS Cardiovascular: Right-sided Port-A-Cath terminates in the SVC. Heart size normal. Coronary artery calcification. No pericardial effusion. Mediastinum/Nodes: No pathologically enlarged mediastinal, hilar, axillary or internal mammary lymph nodes. There is fluid in the left axilla with a surgical drain in place. Esophagus is grossly unremarkable. Lungs/Pleura: Minimal biapical pleuroparenchymal scarring. Lungs  are clear. No pleural fluid. Airway is unremarkable. Musculoskeletal: No worrisome lytic or sclerotic lesions. Postoperative changes of lumpectomy are seen in the left breast with a 3.0 x 7.7 cm collection of fluid laterally. CT ABDOMEN PELVIS FINDINGS Hepatobiliary: Liver appears slightly decreased in attenuation diffusely. Liver and gallbladder are otherwise unremarkable. No biliary ductal dilatation. Pancreas: Negative. Spleen: Negative. Adrenals/Urinary Tract: Adrenal glands and kidneys are unremarkable. Ureters are decompressed. Bladder is unremarkable. Stomach/Bowel: Stomach, small bowel, appendix and colon are unremarkable. Vascular/Lymphatic: Minimal atherosclerotic calcification of the aorta. No pathologically enlarged lymph nodes. Reproductive: Uterus is visualized.  No adnexal mass. Other: No free fluid.  Mesenteries and peritoneum are unremarkable. Musculoskeletal: No worrisome lytic or sclerotic lesions. IMPRESSION: 1. Postoperative changes of recent left lumpectomy and left axillary lymph node dissection, without evidence of metastatic disease. 2. Aortic atherosclerosis (ICD10-170.0). Coronary artery calcification. 3. Liver appears fatty. Electronically Signed   By: MLorin PicketM.D.   On: 02/12/2017 08:45   Nm Cardiac Muga Rest  Result Date: 02/10/2017 CLINICAL DATA:  LEFT breast cancer, cardiotoxic chemotherapy EXAM: NUCLEAR MEDICINE CARDIAC BLOOD POOL IMAGING (MUGA) TECHNIQUE: Cardiac multi-gated acquisition was performed at rest following intravenous injection of Tc-98mabeled red blood cells. RADIOPHARMACEUTICALS:  20.4 mCi [id number] in-vitro labeled red blood cells IV COMPARISON:  None FINDINGS: Calculated LEFT ventricular ejection fraction is 54%. Wall motion analysis of the LEFT ventricle in 3 projections demonstrates no focal wall motion abnormalities. IMPRESSION: LEFT ventricular ejection fraction of 54%. No focal wall motion abnormalities. Electronically Signed   By: MarLavonia DanaD.   On: 02/10/2017 14:20   Ct Abdomen Pelvis W Contrast  Result Date: 02/12/2017 CLINICAL DATA:  New diagnosis left breast cancer with lumpectomy, staging. EXAM: CT CHEST, ABDOMEN, AND PELVIS WITH CONTRAST TECHNIQUE: Multidetector CT imaging of the chest, abdomen and pelvis was performed following the standard protocol during bolus administration of intravenous contrast. CONTRAST:  100m34mOVUE-300 IOPAMIDOL (ISOVUE-300) INJECTION 61% COMPARISON:  None. FINDINGS: CT CHEST FINDINGS Cardiovascular: Right-sided Port-A-Cath terminates in the SVC. Heart size normal. Coronary artery calcification.  No pericardial effusion. Mediastinum/Nodes: No pathologically enlarged mediastinal, hilar, axillary or internal mammary lymph nodes. There is fluid in the left axilla with a surgical drain in place. Esophagus is grossly unremarkable. Lungs/Pleura: Minimal biapical pleuroparenchymal scarring. Lungs are clear. No pleural fluid. Airway is unremarkable. Musculoskeletal: No worrisome lytic or sclerotic lesions. Postoperative changes of lumpectomy are seen in the left breast with a 3.0 x 7.7 cm collection of fluid laterally. CT ABDOMEN PELVIS FINDINGS Hepatobiliary: Liver appears slightly decreased in attenuation diffusely. Liver and gallbladder are otherwise unremarkable. No biliary ductal dilatation. Pancreas: Negative. Spleen: Negative. Adrenals/Urinary Tract: Adrenal glands and kidneys are unremarkable. Ureters are decompressed. Bladder is unremarkable. Stomach/Bowel: Stomach, small bowel, appendix and colon are unremarkable. Vascular/Lymphatic: Minimal atherosclerotic calcification of the aorta. No pathologically enlarged lymph nodes. Reproductive: Uterus is visualized.  No adnexal mass. Other: No free fluid.  Mesenteries and peritoneum are unremarkable. Musculoskeletal: No worrisome lytic or sclerotic lesions. IMPRESSION: 1. Postoperative changes of recent left lumpectomy and left axillary lymph node dissection,  without evidence of metastatic disease. 2. Aortic atherosclerosis (ICD10-170.0). Coronary artery calcification. 3. Liver appears fatty. Electronically Signed   By: Lorin Picket M.D.   On: 02/12/2017 08:45     PATHOLOGY:    ASSESSMENT AND PLAN:  Breast cancer of lower-outer quadrant of left female breast ([place]) Stage IIIA (pT2pN1AM0) left breast cancer in the lower-outer quadrant, ER/PR/HER2 NEGATIVE, S/P left lumpectomy with positive margins by Dr. Ninfa Linden on 01/22/2017 with one positive left axillary node with positive for metastatic disease resulting in a re-excision on 01/30/2017 and left axillary lymph node dissection with invasive component focally 0.1-0.2 cm away from anterior margin but inked margin surface is negative and 0/6 benign lymph nodes.  Pre-treatment labs CBC diff, CMET.  I personally reviewed and went over laboratory results with the patient.  The results are noted within this dictation.    She reported numbness with her last cycle at her nadir check with Dr. Talbert Cage.  Her dexamethasone was therefore decreased to 4 mg.  We will see if this helps.  If not, will discontinue dexamethasone altogether.  She reports a "allergy" to prednisone.    She reports a 9 day history of nausea with severe fatigue.  She denies any vomiting.  We changed her home antiemetic regimen.  She will call if this persists and we will add additional antiemetics if needed.  Reglan/Ativan may be good options.  She reports difficulties during these 9 days with H2O intake.  If this occurs with this cycle, patient will call and I will bring her in for IV fluids and IV antiemetics if indicated.  If nausea continues to be an issue moving forward, I would consider Varubi.  I have added 20 mg of IV Pepcid for treatment today only.  Return in 2 weeks for follow-up with treatment.    ORDERS PLACED FOR THIS ENCOUNTER: No orders of the defined types were placed in this encounter.   MEDICATIONS PRESCRIBED THIS  ENCOUNTER: Meds ordered this encounter  Medications  . dexamethasone (DECADRON) 4 MG tablet    Sig: TAKE TWO TABLETS BY MOUTH ONCE daily ON THE DAY AFTER chemotherapy AND THEN Take 2 Tablets by mouth 2 times a day FOR TWO DAYS with food    Refill:  1    THERAPY PLAN:  Adjuvant chemotherapy with AC + T followed by XRT therapy.  All questions were answered. The patient knows to call the clinic with any problems, questions or concerns. We can certainly see the  patient much sooner if necessary.  Patient and plan discussed with Dr. Twana First and she is in agreement with the aforementioned.   This note is electronically signed by: Doy Mince 02/27/2017 9:50 AM

## 2017-03-03 ENCOUNTER — Encounter (INDEPENDENT_AMBULATORY_CARE_PROVIDER_SITE_OTHER): Payer: Self-pay | Admitting: Internal Medicine

## 2017-03-03 ENCOUNTER — Ambulatory Visit (INDEPENDENT_AMBULATORY_CARE_PROVIDER_SITE_OTHER): Payer: BLUE CROSS/BLUE SHIELD | Admitting: Internal Medicine

## 2017-03-03 VITALS — BP 118/70 | HR 76 | Temp 98.0°F | Ht 65.0 in | Wt 212.7 lb

## 2017-03-03 DIAGNOSIS — K219 Gastro-esophageal reflux disease without esophagitis: Secondary | ICD-10-CM

## 2017-03-03 NOTE — Progress Notes (Signed)
   Subjective:    Patient ID: Shannon Logan, female    DOB: 1966-05-18, 51 y.o.   MRN: [id number]  HPIHere today for f/u.  She was lasat seen in March of  2017.  HIDA scan and US abdomen were normal. She  underwent an EGD/screening colonoscopy in November of 2016.  EGD revealed erosive reflux esophagitis. No evidence of gastritis or PUD. Small sliding hernia.  She tells me she is doing good.  Her appetite is good. She has a BM every few days. No melena or BRRB.     Recent hx of breast cancer and underwent a lumpectomy in February. Is followed by the Cancer in [place]. Presently taking chemo every bi-weekly and then every week x 12 tx. Will take radiation x 5 x 6 weeks (Has not started).   11/10/2-16: Procedure: EGD & Colonoscopy  Indications: Patient is 51 year old Caucasian female who presents with chronic right upper quadrant postprandial pain along with bloating. Ultrasound negative for cholelithiasis and HIDA scan revealed normal EF. She also has intermittent heartburn controlled with when necessary Zantac. She is undergoing diagnostic EGD followed by average risk screening colonoscopy.   Impression:  ED findings: Erosive reflux esophagitis. Small sliding hiatal hernia. No evidence of gastritis or peptic ulcer disease.  Colonoscopy findings: Normal colonoscopy except external hemorrhoids and anal papillae.   09/08/2015 HIDA scan:  IMPRESSION: No cystic duct obstruction. No CBD obstruction. Post CCK gallbladder ejection fraction 96%.   07/28/2015 [place] RUQ: Rt upper quadrant pain.  IMPRESSION: Liver demonstrates increase echogenicity with coarse echotexture consistent with fatty infiltration and/or hepatocellular disease. Exam otherwise negative. No gallstones noted.     Review of Systems     Objective:   Physical Exam Blood pressure 118/70, pulse 76, temperature 98 F (36.7 C), height 5\' 5"  (1.651 m), weight 212 lb 11.2 oz (96.5 kg). Alert and  oriented. Skin warm and dry. Oral mucosa is moist.   . Sclera anicteric, conjunctivae is pink. Thyroid not enlarged. No cervical lymphadenopathy. Lungs clear. Heart regular rate and rhythm.  Abdomen is soft. Bowel sounds are positive. No hepatomegaly. No abdominal masses felt. No tenderness.  No edema to lower extremities.         Assessment & Plan:  GERD. Controlled with Protonix. OV in 1 year.

## 2017-03-03 NOTE — Patient Instructions (Signed)
OV in 1 year.  

## 2017-03-04 ENCOUNTER — Other Ambulatory Visit (HOSPITAL_COMMUNITY): Payer: Self-pay | Admitting: Oncology

## 2017-03-04 ENCOUNTER — Telehealth (HOSPITAL_COMMUNITY): Payer: Self-pay | Admitting: Emergency Medicine

## 2017-03-04 NOTE — Telephone Encounter (Signed)
Pt called and stated that she is having a reaction to the dexamethasone again.  By the time she got to the 5th day her face was numb and she was SOB.  She has taken one benadryl.  I told her to take 2 benadryl and tylenol as its prescribed and to get pepcid and take it.  If this does not help and she feels like she is still SOB of breathe to go to the ER.  PT verbalized understanding.  Told pt not to take any more dexamethasone.  I let Kirby Crigler PA know.

## 2017-03-13 ENCOUNTER — Ambulatory Visit (HOSPITAL_COMMUNITY): Payer: BLUE CROSS/BLUE SHIELD

## 2017-03-13 ENCOUNTER — Encounter (HOSPITAL_BASED_OUTPATIENT_CLINIC_OR_DEPARTMENT_OTHER): Payer: BLUE CROSS/BLUE SHIELD | Admitting: Oncology

## 2017-03-13 ENCOUNTER — Encounter (HOSPITAL_BASED_OUTPATIENT_CLINIC_OR_DEPARTMENT_OTHER): Payer: BLUE CROSS/BLUE SHIELD

## 2017-03-13 ENCOUNTER — Encounter (HOSPITAL_COMMUNITY): Payer: Self-pay

## 2017-03-13 VITALS — BP 146/73 | HR 75 | Temp 97.9°F | Resp 18

## 2017-03-13 VITALS — BP 136/83 | HR 100 | Temp 98.3°F | Resp 18 | Wt 212.6 lb

## 2017-03-13 DIAGNOSIS — Z452 Encounter for adjustment and management of vascular access device: Secondary | ICD-10-CM | POA: Diagnosis not present

## 2017-03-13 DIAGNOSIS — C773 Secondary and unspecified malignant neoplasm of axilla and upper limb lymph nodes: Secondary | ICD-10-CM | POA: Diagnosis not present

## 2017-03-13 DIAGNOSIS — Z170 Estrogen receptor positive status [ER+]: Secondary | ICD-10-CM | POA: Diagnosis not present

## 2017-03-13 DIAGNOSIS — C50512 Malignant neoplasm of lower-outer quadrant of left female breast: Secondary | ICD-10-CM

## 2017-03-13 DIAGNOSIS — Z5111 Encounter for antineoplastic chemotherapy: Secondary | ICD-10-CM

## 2017-03-13 DIAGNOSIS — Z171 Estrogen receptor negative status [ER-]: Secondary | ICD-10-CM

## 2017-03-13 DIAGNOSIS — Z95828 Presence of other vascular implants and grafts: Secondary | ICD-10-CM

## 2017-03-13 DIAGNOSIS — Z5189 Encounter for other specified aftercare: Secondary | ICD-10-CM

## 2017-03-13 LAB — COMPREHENSIVE METABOLIC PANEL WITH GFR
ALT: 28 U/L (ref 14–54)
AST: 22 U/L (ref 15–41)
Albumin: 3.7 g/dL (ref 3.5–5.0)
Alkaline Phosphatase: 89 U/L (ref 38–126)
Anion gap: 7 (ref 5–15)
BUN: 9 mg/dL (ref 6–20)
CO2: 28 mmol/L (ref 22–32)
Calcium: 9.1 mg/dL (ref 8.9–10.3)
Chloride: 106 mmol/L (ref 101–111)
Creatinine, Ser: 0.67 mg/dL (ref 0.44–1.00)
GFR calc Af Amer: >60 mL/min (ref >60)
GFR calc non Af Amer: >60 mL/min (ref >60)
Glucose, Bld: 113 mg/dL — ABNORMAL HIGH (ref 65–99)
Potassium: 4.4 mmol/L (ref 3.5–5.1)
Sodium: 141 mmol/L (ref 135–145)
Total Bilirubin: 0.4 mg/dL (ref 0.3–1.2)
Total Protein: 6.5 g/dL (ref 6.5–8.1)

## 2017-03-13 LAB — CBC WITH DIFFERENTIAL/PLATELET
Basophils Absolute: 0 K/uL (ref 0.0–0.1)
Basophils Relative: 0 %
Eosinophils Absolute: 0 K/uL (ref 0.0–0.7)
Eosinophils Relative: 0 %
HCT: 35.3 % — ABNORMAL LOW (ref 36.0–46.0)
Hemoglobin: 11.6 g/dL — ABNORMAL LOW (ref 12.0–15.0)
Lymphocytes Relative: 17 %
Lymphs Abs: 1.5 K/uL (ref 0.7–4.0)
MCH: 30.4 pg (ref 26.0–34.0)
MCHC: 32.9 g/dL (ref 30.0–36.0)
MCV: 92.7 fL (ref 78.0–100.0)
Monocytes Absolute: 0.8 K/uL (ref 0.1–1.0)
Monocytes Relative: 9 %
Neutro Abs: 6.6 K/uL (ref 1.7–7.7)
Neutrophils Relative %: 74 %
Platelets: 223 K/uL (ref 150–400)
RBC: 3.81 MIL/uL — ABNORMAL LOW (ref 3.87–5.11)
RDW: 14.4 % (ref 11.5–15.5)
WBC: 8.9 K/uL (ref 4.0–10.5)

## 2017-03-13 MED ORDER — PALONOSETRON HCL INJECTION 0.25 MG/5ML
INTRAVENOUS | Status: AC
  Filled 2017-03-13: qty 5

## 2017-03-13 MED ORDER — SODIUM CHLORIDE 0.9 % IV SOLN
600.0000 mg/m2 | Freq: Once | INTRAVENOUS | Status: AC
  Administered 2017-03-13: 1260 mg via INTRAVENOUS
  Filled 2017-03-13: qty 50

## 2017-03-13 MED ORDER — HEPARIN SOD (PORK) LOCK FLUSH 100 UNIT/ML IV SOLN
INTRAVENOUS | Status: AC
  Filled 2017-03-13: qty 5

## 2017-03-13 MED ORDER — PEGFILGRASTIM 6 MG/0.6ML ~~LOC~~ PSKT
6.0000 mg | PREFILLED_SYRINGE | Freq: Once | SUBCUTANEOUS | Status: AC
  Administered 2017-03-13: 6 mg via SUBCUTANEOUS

## 2017-03-13 MED ORDER — SODIUM CHLORIDE 0.9 % IV SOLN
Freq: Once | INTRAVENOUS | Status: AC
  Administered 2017-03-13: 14:00:00 via INTRAVENOUS
  Filled 2017-03-13: qty 5

## 2017-03-13 MED ORDER — PEGFILGRASTIM 6 MG/0.6ML ~~LOC~~ PSKT
PREFILLED_SYRINGE | SUBCUTANEOUS | Status: AC
  Filled 2017-03-13: qty 0.6

## 2017-03-13 MED ORDER — HEPARIN SOD (PORK) LOCK FLUSH 100 UNIT/ML IV SOLN
500.0000 [IU] | Freq: Once | INTRAVENOUS | Status: AC | PRN
  Administered 2017-03-13: 500 [IU]

## 2017-03-13 MED ORDER — SODIUM CHLORIDE 0.9 % IV SOLN
Freq: Once | INTRAVENOUS | Status: AC
  Administered 2017-03-13: 13:00:00 via INTRAVENOUS

## 2017-03-13 MED ORDER — STERILE WATER FOR INJECTION IJ SOLN
INTRAMUSCULAR | Status: AC
  Filled 2017-03-13: qty 10

## 2017-03-13 MED ORDER — ALTEPLASE 2 MG IJ SOLR
2.0000 mg | Freq: Once | INTRAMUSCULAR | Status: AC
  Administered 2017-03-13: 2 mg

## 2017-03-13 MED ORDER — PALONOSETRON HCL INJECTION 0.25 MG/5ML
0.2500 mg | Freq: Once | INTRAVENOUS | Status: AC
  Administered 2017-03-13: 0.25 mg via INTRAVENOUS

## 2017-03-13 MED ORDER — DOXORUBICIN HCL CHEMO IV INJECTION 2 MG/ML
60.0000 mg/m2 | Freq: Once | INTRAVENOUS | Status: AC
  Administered 2017-03-13: 126 mg via INTRAVENOUS
  Filled 2017-03-13: qty 63

## 2017-03-13 MED ORDER — ALTEPLASE 2 MG IJ SOLR
INTRAMUSCULAR | Status: AC
  Filled 2017-03-13: qty 2

## 2017-03-13 NOTE — Progress Notes (Signed)
Shannon Maize, MD [place] [id number]  Progress Note   CURRENT THERAPY: Adjuvant chemotherapy with AC q2 weeks x4 cycles followed by weekly Taxol x12 cycles  INTERVAL HISTORY: Shannon Logan 51 y.o. female returns for followup of Stage IIIA (pT2pN1AM0) left breast cancer in the lower-outer quadrant, ER/PR/HER2 NEGATIVE, S/P left lumpectomy with positive margins by Dr. Ninfa Linden on 01/22/2017 with one positive left axillary node with positive for metastatic disease resulting in a re-excision on 01/30/2017 and left axillary lymph node dissection with invasive component focally 0.1-0.2 cm away from anterior margin but inked margin surface is negative and 0/6 benign lymph nodes.    Breast cancer of lower-outer quadrant of left female breast ([place])   11/19/2016 Mammogram    In the lateral aspect of the left breast, posterior depth, there is an irregular spiculated mass measuring approximately 1.8 cm. There are a few pleomorphic calcifications in a linear distribution within the mass and extending slightly anterior to the mass. All together the mass and calcifications span approximately 2.5 cm.      11/21/2016 Procedure    Breast, left, needle core biopsy, 3:30 o'clock, 8 cm fn      11/22/2016 Pathology Results    Breast, left, needle core biopsy, 3:30 o'clock, 8 cm fn - INVASIVE DUCTAL CARCINOMA, GRADE 2.      01/03/2017 Initial Diagnosis    Breast cancer of lower-outer quadrant of left female breast ([place])     01/22/2017 Procedure    Left breast lumpectomy and left axillary sentinel node biopsy by Dr. Ninfa Linden      01/22/2017 Procedure    Port placed by Dr. Ninfa Linden      01/23/2017 Pathology Results    1. Breast, lumpectomy, Left - INVASIVE GRADE 3 DUCTAL CARCINOMA, SPANNING 2.2 CM IN GREATEST DIMENSION. 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 - LYMPH/VASCULAR INVASION IS PRESENT. - ANTERIOR / MEDIAL MARGIN IS FOCALLY POSITIVE FOR  INVASIVE DUCTAL CARCINOMA AND ANTERIOR MARGIN IS BROADLY POSITIVE FOR INVASIVE DUCTAL CARCINOMA. - DUCTAL CARCINOMA IN SITU IS FOCALLY LESS THAN 0.1 CM TO POSTERIOR MARGIN. - OTHER MARGINS ARE NEGATIVE. - SEE ONCOLOGY TEMPLATE. 2. Lymph node, sentinel, biopsy, Left axillary - ONE BENIGN LYMPH NODE WITH NO TUMOR SEEN (0/1). 3. Lymph node, sentinel, biopsy, Left axillary - ONE LYMPH NODE POSITIVE FOR METASTATIC DUCTAL CARCINOMA (1/1).      01/30/2017 Procedure    Breast, excision, Left, new anterior and medial margins and Lymph nodes, regional resection, Left axillary contents      01/31/2017 Pathology Results    Diagnosis 1. Breast, excision, Left, new anterior and medial margins - RESIDUAL INVASIVE DUCTAL CARCINOMA AND EXTENSIVE RESIDUAL DUCTAL CARCINOMA IN SITU. - INVASIVE DUCTAL CARCINOMA IS FOCALLY 0.1 TO 0.2 CM AWAY FROM ANTERIOR MARGIN BUT INKED MARGINAL SURFACE IS NEGATIVE. - DUCTAL CARCINOMA IN SITU IS FOCALLY LESS THAN 0.1 CM AWAY FROM ANTERIOR MARGIN AND FOCALLY LESS THAN 0.1 CM AWAY FROM MEDIAL ASPECT OF ANTERIOR MARGIN. - SEE COMMENT. 2. Lymph nodes, regional resection, Left axillary contents - SIX BENIGN LYMPH NODES WITH NO TUMOR SEEN (0/6).      02/10/2017 Imaging    [facility]- LEFT ventricular ejection fraction of 54%.  No focal wall motion abnormalities.       02/12/2017 Imaging    CT CAP- 1. Postoperative changes of recent left lumpectomy and left axillary lymph node dissection, without evidence of metastatic disease. 2. Aortic atherosclerosis (ICD10-170.0).  Coronary artery calcification. 3. Liver appears fatty.      02/13/2017 -  Chemotherapy    The patient had DOXOrubicin (ADRIAMYCIN) chemo injection 126 mg, 60 mg/m2 = 126 mg, Intravenous,  Once, 1 of 4 cycles  palonosetron (ALOXI) injection 0.25 mg, 0.25 mg, Intravenous,  Once, 1 of 4 cycles  pegfilgrastim (NEULASTA ONPRO KIT) injection 6 mg, 6 mg, Subcutaneous, Once, 1 of 5 cycles  cyclophosphamide (CYTOXAN)  1,260 mg in sodium chloride 0.9 % 250 mL chemo infusion, 600 mg/m2 = 1,260 mg, Intravenous,  Once, 1 of 4 cycles  PACLitaxel (TAXOL) 168 mg in dextrose 5 % 250 mL chemo infusion ( fosaprepitant (EMEND) 150 mg, dexamethasone (DECADRON) 12 mg in sodium chloride 0.9 % 145 mL IVPB, , Intravenous,  Once, 1 of 4 cycles  for chemotherapy treatment.         Shannon Logan is presents today for continued follow up.    She continues to have facial and body numbness from treatment like her previous treatments. The symptoms have worsened.   She notes that the roof of her mouth peeled after last treatment. She had trouble getting refills on her magic mouthwash due to insurance. She states she had difficulty  swallowing after chemotherapy for 2 days. She can eat and drink normally, but it "just feels difficult."  She notes that her Claritin "tears up her nose". She gets epistaxis and pain. Last time she had nausea for the first day after treatment. She denies nausea and vomiting this cycle.   She notes that she has severe diarrhea. It starts about 4-5 days after treatment. She had her chemotherapy treatment on last Thursday. She takes 2 doses of imodium to resolve it.    Review of Systems  Constitutional: Negative.   HENT: Positive for nosebleeds and sinus pain.        Difficutly swallowing for first couple days after treatment.   Roof of her mouth peeled after last treatment.  Facial numbness.  Claritin "tears up her nose."  Eyes: Negative.   Respiratory: Negative.   Cardiovascular: Negative.   Gastrointestinal: Positive for diarrhea. Negative for nausea and vomiting.  Genitourinary: Negative.   Musculoskeletal: Negative.           Skin: Negative.   Neurological: Positive for tingling (whole body numbess ).  Endo/Heme/Allergies: Negative.   Psychiatric/Behavioral: Negative.   All other systems reviewed and are negative.   Past Medical History:  Diagnosis Date  . Breast cancer of  lower-outer quadrant of left female breast ([place]) 01/03/2017  . Cancer ([place]) 11/22/2016   Breast   . Family history of breast cancer   . GERD (gastroesophageal reflux disease)     Past Surgical History:  Procedure Laterality Date  . BREAST LUMPECTOMY WITH AXILLARY LYMPH NODE DISSECTION Left 01/30/2017   Procedure: RE-EXCISION OF LEFT BREAST LUMPECTOMY AND LEFT AXILLARY LYMPH NODE DISSECTION;  Surgeon: Coralie Keens, MD;  Location: [facility];  Service: General;  Laterality: Left;  . BREAST LUMPECTOMY WITH RADIOACTIVE SEED AND SENTINEL LYMPH NODE BIOPSY Left 01/22/2017   Procedure: BREAST LUMPECTOMY WITH RADIOACTIVE SEED AND SENTINEL LYMPH NODE BIOPSY;  Surgeon: Coralie Keens, MD;  Location: [facility];  Service: General;  Laterality: Left;  . COLONOSCOPY N/A 10/26/2015   Procedure: COLONOSCOPY;  Surgeon: Rogene Houston, MD;  [facility];  Service: Endoscopy;  Laterality: N/A;  . DILATION AND CURETTAGE OF UTERUS    . ESOPHAGOGASTRODUODENOSCOPY N/A 10/26/2015   Procedure: ESOPHAGOGASTRODUODENOSCOPY (EGD);  Surgeon: Malissa Hippo, MD;  [facility];  Service: Endoscopy;  Laterality: N/A;  1:00  . PORTACATH PLACEMENT Right 01/22/2017   Procedure: INSERTION PORT-A-CATH;  Surgeon: Abigail Miyamoto, MD;  Location: [facility];  Service: General;  Laterality: Right;  . URETHRAL DILATION      Family History  Problem Relation Age of Onset  . Hypertension Mother   . Hyperlipidemia Mother   . Heart disease Father   . Hypertension Father   . Diabetes Father   . Aneurysm Maternal Grandmother   . Stroke Maternal Grandfather   . Heart disease Paternal Grandfather   . Breast cancer Other     PGF's sister  . Breast cancer Other     PGF's sister  . Breast cancer Other     PGF's aunt (great, great aunt)  . Breast cancer Other     PGM's mother    Social History   Social History  . Marital status: Married    Spouse name:  Jillyn Hidden  . Number of children: 3  . Years of education: N/A   Social History Main Topics  . Smoking status: Former Smoker    Quit date: 07/23/2008  . Smokeless tobacco: Never Used     Comment: quit smoking 07/23/2008 after smoking 30 yrs. (1/2 pack a day)  . Alcohol use 0.0 oz/week     Comment: rare  . Drug use: No  . Sexual activity: Yes   Other Topics Concern  . None   Social History Narrative  . None     PHYSICAL EXAMINATION  ECOG PERFORMANCE STATUS: 0 - Asymptomatic  Vitals:   03/13/17 1127  BP: 136/83  Pulse: 100  Resp: 18  Temp: 98.3 F (36.8 C)   Filed Weights   03/13/17 1127  Weight: 212 lb 9.6 oz (96.4 kg)      Physical Exam  Constitutional: She is oriented to person, place, and time and well-developed, well-nourished, and in no distress.  HENT:  Head: Normocephalic and atraumatic.  Eyes: Conjunctivae and EOM are normal. Pupils are equal, round, and reactive to light.  Neck: Normal range of motion. Neck supple.  Cardiovascular: Normal rate, regular rhythm and normal heart sounds.   Pulmonary/Chest: Effort normal and breath sounds normal.  Abdominal: Soft. Bowel sounds are normal.  Musculoskeletal: Normal range of motion.  Neurological: She is alert and oriented to person, place, and time. Gait normal.  Skin: Skin is warm and dry.  Nursing note and vitals reviewed.    LABORATORY DATA: CBC    Component Value Date/Time   WBC 8.2 02/27/2017 0850   RBC 4.08 02/27/2017 0850   HGB 12.7 02/27/2017 0850   HCT 38.0 02/27/2017 0850   PLT 222 02/27/2017 0850   MCV 93.1 02/27/2017 0850   MCV 90.2 07/02/2013 1523   MCH 31.1 02/27/2017 0850   MCHC 33.4 02/27/2017 0850   RDW 13.9 02/27/2017 0850   LYMPHSABS 2.1 02/27/2017 0850   MONOABS 0.6 02/27/2017 0850   EOSABS 0.0 02/27/2017 0850   BASOSABS 0.0 02/27/2017 0850      Chemistry      Component Value Date/Time   NA 138 02/27/2017 0850   NA 144 10/25/2016 0910   K 3.9 02/27/2017 0850   CL 104  02/27/2017 0850   CO2 26 02/27/2017 0850   BUN 13 02/27/2017 0850   BUN 13 10/25/2016 0910   CREATININE 0.72 02/27/2017 0850   CREATININE 0.78 07/02/2013 1448      Component  Value Date/Time   CALCIUM 9.3 02/27/2017 0850   ALKPHOS 79 02/27/2017 0850   AST 21 02/27/2017 0850   ALT 24 02/27/2017 0850   BILITOT 0.5 02/27/2017 0850   BILITOT 0.9 10/25/2016 0910        PENDING LABS:   RADIOGRAPHIC STUDIES:  NUCLEAR MEDICINE CARDIAC BLOOD POOL IMAGING ([facility]) 02/10/2017  IMPRESSION: LEFT ventricular ejection fraction of 54%.  No focal wall motion abnormalities.  CT CHEST, ABDOMEN, AND PELVIS WITH CONTRAST  02/12/2017  IMPRESSION: 1. Postoperative changes of recent left lumpectomy and left axillary lymph node dissection, without evidence of metastatic disease. 2. Aortic atherosclerosis (ICD10-170.0). Coronary artery calcification. 3. Liver appears fatty.     PATHOLOGY:           ASSESSMENT AND PLAN:  Stage IIIA (pT2pN1AM0) left breast cancer in the lower-outer quadrant, ER/PR/HER2 NEGATIVE, S/P left lumpectomy with positive margins by Dr. Ninfa Linden on 01/22/2017 with one positive left axillary node with positive for metastatic disease resulting in a re-excision on 01/30/2017 and left axillary lymph node dissection with invasive component focally 0.1-0.2 cm away from anterior margin but inked margin surface is negative and 0/6 benign lymph nodes.  Currently on adjuvant systemic chemotherapy with AC+ T.  PLAN: I have discontinued her steroids since her facial and body numbness symptoms have persisted and has been getting worse.   Nausea/vomiting is improved so there is no need to add varubi.  Proceed with cycle 3 of AC today.  RTC in 2 weeks for follow up.     All questions were answered. The patient knows to call the clinic with any problems, questions or concerns. We can certainly see the patient much sooner if necessary.  This document serves as a record of  services personally performed by Twana First, MD. It was created on her behalf by Shirlean Mylar, a trained medical scribe. The creation of this record is based on the scribe's personal observations and the provider's statements to them. This document has been checked and approved by the attending provider.  I have reviewed the above documentation for accuracy and completeness and I agree with the above.  This note is electronically signed by: Mikey College 03/13/2017 11:42 AM

## 2017-03-13 NOTE — Progress Notes (Signed)
Accessed port. No blood return after multiple flushes. No pain or discomfort reported. Infused NS 100 ml at 999 ml/hr. Still no blood return as report of discomfort. Instilled alteplase per protocol.  Apx 1 hr 5 mins post alteplase instillation able to withdraw 10 ml blood without any difficulty or resistance. Port flushes easily without pain/discomfort.  Marland KitchenArlana Logan arrived today for [facility] neulasta on body injector. See MAR for administration details. Injector in place and engaged with green light indicator on flashing. Tolerated application with out problems.  Tolerated chemo well. Stable and ambulatory on discharge home with family.

## 2017-03-13 NOTE — Patient Instructions (Signed)
[facility] at [facility] Discharge Instructions  RECOMMENDATIONS MADE BY THE CONSULTANT AND ANY TEST RESULTS WILL BE SENT TO YOUR REFERRING PHYSICIAN.  You were seen today by Dr. Louise Zhou Follow up in 2 weeks See Amy up front for appointments   Thank you for choosing [facility] at [facility] to provide your oncology and hematology care.  To afford each patient quality time with our provider, please arrive at least 15 minutes before your scheduled appointment time.    If you have a lab appointment with the Cancer Center please come in thru the  Main Entrance and check in at the main information desk  You need to re-schedule your appointment should you arrive 10 or more minutes late.  We strive to give you quality time with our providers, and arriving late affects you and other patients whose appointments are after yours.  Also, if you no show three or more times for appointments you may be dismissed from the clinic at the providers discretion.     Again, thank you for choosing [facility].  Our hope is that these requests will decrease the amount of time that you wait before being seen by our physicians.       _____________________________________________________________  Should you have questions after your visit to [facility], please contact our office at [phone] between the hours of 8:30 a.m. and 4:30 p.m.  Voicemails left after 4:30 p.m. will not be returned until the following business day.  For prescription refill requests, have your pharmacy contact our office.       Resources For Cancer Patients and their Caregivers ? American Cancer Society: Can assist with transportation, wigs, general needs, runs Look Good Feel Better.        1-888-227-6333 ? Cancer Care: Provides financial assistance, online support groups, medication/co-pay assistance.  1-800-813-HOPE (4673) ? [facility] Assists [place] cancer patients and their families through emotional , educational and financial support.  336-427-4357 ? [place] DSS Where to apply for food stamps, Medicaid and utility assistance. 336-342-1394 ? RCATS: Transportation to medical appointments. 336-347-2287 ? Social Security Administration: May apply for disability if have a Stage IV cancer. 336-342-7796 1-800-772-1213 ? [place] [facility], Disability and Transit Services: Assists with nutrition, care and transit needs. 336-349-2343  Cancer Center Support Programs: @[email or web address]@ > Cancer Support Group  2nd Tuesday of the month 1pm-2pm, Journey Room  > Creative Journey  3rd Tuesday of the month 1130am-1pm, Journey Room  > Look Good Feel Better  1st Wednesday of the month 10am-12 noon, Journey Room (Call American Cancer Society to register 1-800-395-5775)    

## 2017-03-13 NOTE — Patient Instructions (Signed)
[facility] Discharge Instructions for Patients Receiving Chemotherapy   Beginning January 23rd 2017 lab work for the [facility] will be done in the  Main lab at [facility] on 1st floor. If you have a lab appointment with the [facility] please come in thru the  Main Entrance and check in at the main information desk   Today you received the following chemotherapy agents adriamycin and cytoxan.  To help prevent nausea and vomiting after your treatment, we encourage you to take your nausea medication as instructed.  Neulasta device will dispense medication between 615 pm and 715 pm tomorrow. You may take device off after 715 pm tomorrow Friday March 30.   If you develop nausea and vomiting, or diarrhea that is not controlled by your medication, call the clinic.  The clinic phone number is [phone]. Office hours are Monday-Friday 8:30am-5:00pm.  BELOW ARE SYMPTOMS THAT SHOULD BE REPORTED IMMEDIATELY:  *FEVER GREATER THAN 101.0 F  *CHILLS WITH OR WITHOUT FEVER  NAUSEA AND VOMITING THAT IS NOT CONTROLLED WITH YOUR NAUSEA MEDICATION  *UNUSUAL SHORTNESS OF BREATH  *UNUSUAL BRUISING OR BLEEDING  TENDERNESS IN MOUTH AND THROAT WITH OR WITHOUT PRESENCE OF ULCERS  *URINARY PROBLEMS  *BOWEL PROBLEMS  UNUSUAL RASH Items with * indicate a potential emergency and should be followed up as soon as possible. If you have an emergency after office hours please contact your primary care physician or go to the nearest emergency department.  Please call the clinic during office hours if you have any questions or concerns.   You may also contact the Patient Navigator at [phone] should you have any questions or need assistance in obtaining follow up care.      Resources For Cancer Patients and their Caregivers ? American Cancer Society: Can assist with transportation, wigs, general needs, runs Look Good Feel Better.        (929)654-4372 ? Cancer  Care: Provides financial assistance, online support groups, medication/co-pay assistance.  1-800-813-HOPE (602) 671-7528) ? [facility] Assists [place] cancer patients and their families through emotional , educational and financial support.  403 582 7250 ? [place] DSS Where to apply for food stamps, Medicaid and utility assistance. 352-086-8012 ? RCATS: Transportation to medical appointments. 7020030456 ? Social Security Administration: May apply for disability if have a Stage IV cancer. (574) 857-7794 (901) 477-7076 ? [employer], Disability and Transit Services: Assists with nutrition, care and transit needs. 857-294-6474

## 2017-03-27 ENCOUNTER — Encounter (HOSPITAL_COMMUNITY): Payer: BLUE CROSS/BLUE SHIELD | Attending: Hematology & Oncology

## 2017-03-27 ENCOUNTER — Encounter (HOSPITAL_BASED_OUTPATIENT_CLINIC_OR_DEPARTMENT_OTHER): Payer: BLUE CROSS/BLUE SHIELD | Admitting: Oncology

## 2017-03-27 ENCOUNTER — Encounter (HOSPITAL_COMMUNITY): Payer: Self-pay | Admitting: Oncology

## 2017-03-27 VITALS — BP 133/73 | HR 87 | Temp 98.3°F | Resp 18 | Wt 214.2 lb

## 2017-03-27 DIAGNOSIS — Z5111 Encounter for antineoplastic chemotherapy: Secondary | ICD-10-CM

## 2017-03-27 DIAGNOSIS — C773 Secondary and unspecified malignant neoplasm of axilla and upper limb lymph nodes: Secondary | ICD-10-CM | POA: Diagnosis not present

## 2017-03-27 DIAGNOSIS — Z5189 Encounter for other specified aftercare: Secondary | ICD-10-CM

## 2017-03-27 DIAGNOSIS — C50512 Malignant neoplasm of lower-outer quadrant of left female breast: Secondary | ICD-10-CM

## 2017-03-27 DIAGNOSIS — Z171 Estrogen receptor negative status [ER-]: Secondary | ICD-10-CM | POA: Diagnosis not present

## 2017-03-27 LAB — CBC WITH DIFFERENTIAL/PLATELET
BASOS ABS: 0 10*3/uL (ref 0.0–0.1)
Basophils Relative: 0 %
EOS ABS: 0 10*3/uL (ref 0.0–0.7)
EOS PCT: 0 %
HCT: 31.1 % — ABNORMAL LOW (ref 36.0–46.0)
Hemoglobin: 10.4 g/dL — ABNORMAL LOW (ref 12.0–15.0)
LYMPHS ABS: 1.4 10*3/uL (ref 0.7–4.0)
LYMPHS PCT: 17 %
MCH: 30.8 pg (ref 26.0–34.0)
MCHC: 33.4 g/dL (ref 30.0–36.0)
MCV: 92 fL (ref 78.0–100.0)
MONO ABS: 1 10*3/uL (ref 0.1–1.0)
Monocytes Relative: 12 %
Neutro Abs: 5.9 10*3/uL (ref 1.7–7.7)
Neutrophils Relative %: 71 %
PLATELETS: 240 10*3/uL (ref 150–400)
RBC: 3.38 MIL/uL — AB (ref 3.87–5.11)
RDW: 14.8 % (ref 11.5–15.5)
WBC: 8.3 10*3/uL (ref 4.0–10.5)

## 2017-03-27 LAB — COMPREHENSIVE METABOLIC PANEL
ALK PHOS: 96 U/L (ref 38–126)
ALT: 25 U/L (ref 14–54)
AST: 21 U/L (ref 15–41)
Albumin: 3.7 g/dL (ref 3.5–5.0)
Anion gap: 8 (ref 5–15)
BUN: 7 mg/dL (ref 6–20)
CALCIUM: 8.8 mg/dL — AB (ref 8.9–10.3)
CHLORIDE: 104 mmol/L (ref 101–111)
CO2: 26 mmol/L (ref 22–32)
CREATININE: 0.62 mg/dL (ref 0.44–1.00)
GFR calc Af Amer: >60 mL/min (ref >60)
GFR calc non Af Amer: >60 mL/min (ref >60)
GLUCOSE: 119 mg/dL — AB (ref 65–99)
Potassium: 3.8 mmol/L (ref 3.5–5.1)
SODIUM: 138 mmol/L (ref 135–145)
Total Bilirubin: 0.6 mg/dL (ref 0.3–1.2)
Total Protein: 6.6 g/dL (ref 6.5–8.1)

## 2017-03-27 MED ORDER — PALONOSETRON HCL INJECTION 0.25 MG/5ML
0.2500 mg | Freq: Once | INTRAVENOUS | Status: AC
  Administered 2017-03-27: 0.25 mg via INTRAVENOUS
  Filled 2017-03-27: qty 5

## 2017-03-27 MED ORDER — SODIUM CHLORIDE 0.9% FLUSH
10.0000 mL | INTRAVENOUS | Status: DC | PRN
  Administered 2017-03-27: 10 mL
  Filled 2017-03-27: qty 10

## 2017-03-27 MED ORDER — PROMETHAZINE HCL 25 MG PO TABS: 25.0000 mg | ORAL_TABLET | Freq: Four times a day (QID) | ORAL | 1 refills | Status: DC | PRN

## 2017-03-27 MED ORDER — SODIUM CHLORIDE 0.9 % IV SOLN
600.0000 mg/m2 | Freq: Once | INTRAVENOUS | Status: AC
  Administered 2017-03-27: 1260 mg via INTRAVENOUS
  Filled 2017-03-27: qty 13

## 2017-03-27 MED ORDER — FOSAPREPITANT DIMEGLUMINE INJECTION 150 MG
Freq: Once | INTRAVENOUS | Status: AC
  Administered 2017-03-27: 13:00:00 via INTRAVENOUS
  Filled 2017-03-27: qty 5

## 2017-03-27 MED ORDER — SODIUM CHLORIDE 0.9 % IV SOLN
Freq: Once | INTRAVENOUS | Status: AC
  Administered 2017-03-27: 13:00:00 via INTRAVENOUS

## 2017-03-27 MED ORDER — PEGFILGRASTIM 6 MG/0.6ML ~~LOC~~ PSKT
6.0000 mg | PREFILLED_SYRINGE | Freq: Once | SUBCUTANEOUS | Status: AC
  Administered 2017-03-27: 6 mg via SUBCUTANEOUS
  Filled 2017-03-27: qty 0.6

## 2017-03-27 MED ORDER — DOXORUBICIN HCL CHEMO IV INJECTION 2 MG/ML
60.0000 mg/m2 | Freq: Once | INTRAVENOUS | Status: AC
  Administered 2017-03-27: 126 mg via INTRAVENOUS
  Filled 2017-03-27: qty 63

## 2017-03-27 MED ORDER — HEPARIN SOD (PORK) LOCK FLUSH 100 UNIT/ML IV SOLN
500.0000 [IU] | Freq: Once | INTRAVENOUS | Status: AC | PRN
  Administered 2017-03-27: 500 [IU]
  Filled 2017-03-27: qty 5

## 2017-03-27 MED ORDER — DICYCLOMINE HCL 20 MG PO TABS: 20.0000 mg | ORAL_TABLET | Freq: Three times a day (TID) | ORAL | 0 refills | Status: DC

## 2017-03-27 NOTE — Progress Notes (Signed)
Shannon Logan, Shannon Logan [id number]  Malignant neoplasm of lower-outer quadrant of left breast of female, estrogen receptor negative ([place]) - Plan: promethazine (PHENERGAN) 25 MG tablet, dicyclomine (BENTYL) 20 MG tablet  CURRENT THERAPY: AC beginning on 02/13/2017.  INTERVAL HISTORY: Shannon Logan 51 y.o. female returns for followup of Stage IIIA (pT2pN1AM0) left breast cancer in the lower-outer quadrant, ER/PR/HER2 NEGATIVE, S/P left lumpectomy with positive margins by Dr. Ninfa Linden on 01/22/2017 with one positive left axillary node with positive for metastatic disease resulting in a re-excision on 01/30/2017 and left axillary lymph node dissection with invasive component focally 0.1-0.2 cm away from anterior margin but inked margin surface is negative and 0/6 benign lymph nodes.    Breast cancer of lower-outer quadrant of left female breast ([place])   11/19/2016 Mammogram    In the lateral aspect of the left breast, posterior depth, there is an irregular spiculated mass measuring approximately 1.8 cm. There are a few pleomorphic calcifications in a linear distribution within the mass and extending slightly anterior to the mass. All together the mass and calcifications span approximately 2.5 cm.      11/21/2016 Procedure    Breast, left, needle core biopsy, 3:30 o'clock, 8 cm fn      11/22/2016 Pathology Results    Breast, left, needle core biopsy, 3:30 o'clock, 8 cm fn - INVASIVE DUCTAL CARCINOMA, GRADE 2.      01/03/2017 Initial Diagnosis    Breast cancer of lower-outer quadrant of left female breast ([place])     01/22/2017 Procedure    Left breast lumpectomy and left axillary sentinel node biopsy by Dr. Ninfa Linden      01/22/2017 Procedure    Port placed by Dr. Ninfa Linden      01/23/2017 Pathology Results    1. Breast, lumpectomy, Left - INVASIVE GRADE 3 DUCTAL CARCINOMA, SPANNING 2.2 CM IN GREATEST DIMENSION. - ASSOCIATED HIGH GRADE DUCTAL CARCINOMA IN SITU  WITH COMEDONECROSIS. - LYMPH/VASCULAR INVASION IS PRESENT. - ANTERIOR / MEDIAL MARGIN IS FOCALLY POSITIVE FOR INVASIVE DUCTAL CARCINOMA AND ANTERIOR MARGIN IS BROADLY POSITIVE FOR INVASIVE DUCTAL CARCINOMA. - DUCTAL CARCINOMA IN SITU IS FOCALLY LESS THAN 0.1 CM TO POSTERIOR MARGIN. - OTHER MARGINS ARE NEGATIVE. - SEE ONCOLOGY TEMPLATE. 2. Lymph node, sentinel, biopsy, Left axillary - ONE BENIGN LYMPH NODE WITH NO TUMOR SEEN (0/1). 3. Lymph node, sentinel, biopsy, Left axillary - ONE LYMPH NODE POSITIVE FOR METASTATIC DUCTAL CARCINOMA (1/1).      01/30/2017 Procedure    Breast, excision, Left, new anterior and medial margins and Lymph nodes, regional resection, Left axillary contents      01/31/2017 Pathology Results    Diagnosis 1. Breast, excision, Left, new anterior and medial margins - RESIDUAL INVASIVE DUCTAL CARCINOMA AND EXTENSIVE RESIDUAL DUCTAL CARCINOMA IN SITU. - INVASIVE DUCTAL CARCINOMA IS FOCALLY 0.1 TO 0.2 CM AWAY FROM ANTERIOR MARGIN BUT INKED MARGINAL SURFACE IS NEGATIVE. - DUCTAL CARCINOMA IN SITU IS FOCALLY LESS THAN 0.1 CM AWAY FROM ANTERIOR MARGIN AND FOCALLY LESS THAN 0.1 CM AWAY FROM MEDIAL ASPECT OF ANTERIOR MARGIN. - SEE COMMENT. 2. Lymph nodes, regional resection, Left axillary contents - SIX BENIGN LYMPH NODES WITH NO TUMOR SEEN (0/6).      02/10/2017 Imaging    MUGA- LEFT ventricular ejection fraction of 54%.  No focal wall motion abnormalities.       02/12/2017 Imaging    CT CAP- 1. Postoperative changes of recent left lumpectomy and left  axillary lymph node dissection, without evidence of metastatic disease. 2. Aortic atherosclerosis (ICD10-170.0). Coronary artery calcification. 3. Liver appears fatty.      02/13/2017 -  Chemotherapy    The patient had DOXOrubicin (ADRIAMYCIN) chemo injection 126 mg, 60 mg/m2 = 126 mg, Intravenous,  Once, 1 of 4 cycles  palonosetron (ALOXI) injection 0.25 mg, 0.25 mg, Intravenous,  Once, 1 of 4  cycles  pegfilgrastim (NEULASTA ONPRO KIT) injection 6 mg, 6 mg, Subcutaneous, Once, 1 of 5 cycles  cyclophosphamide (CYTOXAN) 1,260 mg in sodium chloride 0.9 % 250 mL chemo infusion, 600 mg/m2 = 1,260 mg, Intravenous,  Once, 1 of 4 cycles  PACLitaxel (TAXOL) 168 mg in dextrose 5 % 250 mL chemo infusion ( fosaprepitant (EMEND) 150 mg, dexamethasone (DECADRON) 12 mg in sodium chloride 0.9 % 145 mL IVPB, , Intravenous,  Once, 1 of 4 cycles  for chemotherapy treatment.         She is here today for cycle #4 of AC.  She has had a number of complications from treatment but reports that her last cycle was tolerated without difficulty.  She reports abdominal cramping over the last number of days.  She denies any nausea or vomiting.  She denies any association with food.  She also reports minimal bloating as well.  She denies any changes in flatulence.  She asked if there is anything we can do about this abdominal cramping.  She also reports on the frontal aspect of her scalp some discomfort that comes and goes.  She notes that it is sometimes in the right side, sometimes on left side.  She denies any bruising.  She notes that it feels like there is a bruise.  That is a sensation she feels.  I have given her some education regarding the second part of her treatment plan which is weekly paclitaxel.  We reviewed the risks, benefits, alternatives, and side effects of this therapy as outlined below in the assessment and plan.  She requests a refill on her Phenergan.  She reports that she lost her Phenergan bottle.  Review of Systems  Constitutional: Negative.  Negative for chills, fever and weight loss.  HENT: Negative.   Eyes: Negative.   Respiratory: Negative.  Negative for cough.   Cardiovascular: Negative.  Negative for chest pain.  Gastrointestinal: Positive for abdominal pain. Negative for blood in stool, constipation, diarrhea, melena, nausea and vomiting.  Genitourinary: Negative.    Musculoskeletal: Negative.   Skin: Negative.  Negative for itching and rash.  Neurological: Negative.  Negative for weakness.  Endo/Heme/Allergies: Negative.   Psychiatric/Behavioral: Negative.     Past Medical History:  Diagnosis Date  . Breast cancer of lower-outer quadrant of left female breast ([place]) 01/03/2017  . Cancer ([place]) 11/22/2016   Breast   . Family history of breast cancer   . GERD (gastroesophageal reflux disease)     Past Surgical History:  Procedure Laterality Date  . BREAST LUMPECTOMY WITH AXILLARY LYMPH NODE DISSECTION Left 01/30/2017   Procedure: RE-EXCISION OF LEFT BREAST LUMPECTOMY AND LEFT AXILLARY LYMPH NODE DISSECTION;  Surgeon: Coralie Keens, MD;  Location: [facility];  Service: General;  Laterality: Left;  . BREAST LUMPECTOMY WITH RADIOACTIVE SEED AND SENTINEL LYMPH NODE BIOPSY Left 01/22/2017   Procedure: BREAST LUMPECTOMY WITH RADIOACTIVE SEED AND SENTINEL LYMPH NODE BIOPSY;  Surgeon: Coralie Keens, MD;  Location: [facility];  Service: General;  Laterality: Left;  . COLONOSCOPY N/A 10/26/2015   Procedure: COLONOSCOPY;  Surgeon: Mechele Dawley  Laural Golden, MD;  [facility];  Service: Endoscopy;  Laterality: N/A;  . DILATION AND CURETTAGE OF UTERUS    . ESOPHAGOGASTRODUODENOSCOPY N/A 10/26/2015   Procedure: ESOPHAGOGASTRODUODENOSCOPY (EGD);  Surgeon: Rogene Houston, MD;  [facility];  Service: Endoscopy;  Laterality: N/A;  1:00  . PORTACATH PLACEMENT Right 01/22/2017   Procedure: INSERTION PORT-A-CATH;  Surgeon: Coralie Keens, MD;  Location: [facility];  Service: General;  Laterality: Right;  . URETHRAL DILATION      Family History  Problem Relation Age of Onset  . Hypertension Mother   . Hyperlipidemia Mother   . Heart disease Father   . Hypertension Father   . Diabetes Father   . Aneurysm Maternal Grandmother   . Stroke Maternal Grandfather   . Heart disease Paternal Grandfather   .  Breast cancer Other     PGF's sister  . Breast cancer Other     PGF's sister  . Breast cancer Other     PGF's aunt (great, great aunt)  . Breast cancer Other     PGM's mother    Social History   Social History  . Marital status: Married    Spouse name: [name]  . Number of children: 3  . Years of education: N/A   Social History Main Topics  . Smoking status: Former Smoker    Quit date: 07/23/2008  . Smokeless tobacco: Never Used     Comment: quit smoking 07/23/2008 after smoking 30 yrs. (1/2 pack a day)  . Alcohol use 0.0 oz/week     Comment: rare  . Drug use: No  . Sexual activity: Yes   Other Topics Concern  . None   Social History Narrative  . None     PHYSICAL EXAMINATION  ECOG PERFORMANCE STATUS: 0 - Asymptomatic  There were no vitals filed for this visit.  Vitals - 1 value per visit 0/27/2536  SYSTOLIC 644  DIASTOLIC 61  Pulse 96  Temperature 97.6  Respirations 18  Weight (lb) 214.2    GENERAL:alert, no distress, well nourished, well developed, comfortable, cooperative, obese, smiling and accompanied by mother, in chemo-recliner, alopecia secondary to chemotherapy. SKIN: skin color, texture, turgor are normal, no rashes or significant lesions HEAD: Normocephalic, No masses, lesions, tenderness or abnormalities EYES: normal EARS: External ears normal OROPHARYNX:lips, buccal mucosa, and tongue normal and mucous membranes are moist  NECK: supple, trachea midline LYMPH:  not examined BREAST:not examined LUNGS: clear to auscultation and percussion HEART: regular rate & rhythm without murmur, rub, or gallop. ABDOMEN:abdomen soft, obese and normal bowel sounds BACK: Back symmetric, no curvature. EXTREMITIES:less then 2 second capillary refill, no joint deformities, effusion, or inflammation, no skin discoloration, no cyanosis  NEURO: alert & oriented x 3 with fluent speech, no focal motor/sensory deficits, gait normal   LABORATORY DATA: CBC    Component  Value Date/Time   WBC 8.3 03/27/2017 1142   RBC 3.38 (L) 03/27/2017 1142   HGB 10.4 (L) 03/27/2017 1142   HCT 31.1 (L) 03/27/2017 1142   PLT 240 03/27/2017 1142   MCV 92.0 03/27/2017 1142   MCV 90.2 07/02/2013 1523   MCH 30.8 03/27/2017 1142   MCHC 33.4 03/27/2017 1142   RDW 14.8 03/27/2017 1142   LYMPHSABS 1.4 03/27/2017 1142   MONOABS 1.0 03/27/2017 1142   EOSABS 0.0 03/27/2017 1142   BASOSABS 0.0 03/27/2017 1142      Chemistry      Component Value Date/Time   NA 138 03/27/2017 1142  NA 144 10/25/2016 0910   K 3.8 03/27/2017 1142   CL 104 03/27/2017 1142   CO2 26 03/27/2017 1142   BUN 7 03/27/2017 1142   BUN 13 10/25/2016 0910   CREATININE 0.62 03/27/2017 1142   CREATININE 0.78 07/02/2013 1448      Component Value Date/Time   CALCIUM 8.8 (L) 03/27/2017 1142   ALKPHOS 96 03/27/2017 1142   AST 21 03/27/2017 1142   ALT 25 03/27/2017 1142   BILITOT 0.6 03/27/2017 1142   BILITOT 0.9 10/25/2016 0910        PENDING LABS:   RADIOGRAPHIC STUDIES:  No results found.   PATHOLOGY:    ASSESSMENT AND PLAN:  Breast cancer of lower-outer quadrant of left female breast ([place]) Stage IIIA (pT2pN1AM0) left breast cancer in the lower-outer quadrant, ER/PR/HER2 NEGATIVE, S/P left lumpectomy with positive margins by Dr. Ninfa Linden on 01/22/2017 with one positive left axillary node with positive for metastatic disease resulting in a re-excision on 01/30/2017 and left axillary lymph node dissection with invasive component focally 0.1-0.2 cm away from anterior margin but inked margin surface is negative and 0/6 benign lymph nodes.  Pre-treatment labs CBC diff, CMET.  I personally reviewed and went over laboratory results with the patient.  The results are noted within this dictation.  Labs satisfy treatment parameters.  Mild anemia noted, secondary to chemotherapy.  We discussed Taxol-specific side effects including myelosuppression with dose limiting neutropenia nadir at day 8-10 and  recovery by day 15-21, hypersensitivity reaction occuring in 20-40% of patients, neurotoxicity mainly in the form of sensory neuropathy with numbness and paresthesias, transient asymptomatic bradycardia is the most commonly observed cardiotoxicity, alopecia occurs in nearly all patients with total body hair loss, mucositis and/or diarrhea in 30-40% of patients, transient elevations in serum transaminases, bilirubin, and alkaline phophatase, and onycholysis which is mainly observed in those receiving >6 courses on a weekly schedule and not typical in those receiving paclitaxel on an every 3 week schedule.  She reports abdominal bloating/cramping.  Will try Bentyl 20 mg.  Rx is escribed.  She requests a refill on her phenergan.  Rx is printed.  She will return to embark on cycle #1 of weekly Paclitaxel.  Return in 3 weeks for follow-up with cycle #2 of Paclitaxel.    ORDERS PLACED FOR THIS ENCOUNTER: No orders of the defined types were placed in this encounter.   MEDICATIONS PRESCRIBED THIS ENCOUNTER: Meds ordered this encounter  Medications  . promethazine (PHENERGAN) 25 MG tablet    Sig: Take 1 tablet (25 mg total) by mouth every 6 (six) hours as needed for nausea or vomiting.    Dispense:  30 tablet    Refill:  1    Order Specific Question:   Supervising Provider    Answer:   Brunetta Genera [[id number]]  . dicyclomine (BENTYL) 20 MG tablet    Sig: Take 1 tablet (20 mg total) by mouth 3 (three) times daily before meals.    Dispense:  60 tablet    Refill:  0    Order Specific Question:   Supervising Provider    Answer:   Brunetta Genera [[id number]]    THERAPY PLAN:  Adjuvant chemotherapy with AC + T followed by XRT therapy.  All questions were answered. The patient knows to call the clinic with any problems, questions or concerns. We can certainly see the patient much sooner if necessary.  Patient and plan discussed with Dr. Twana First and she is in agreement with the  aforementioned.   This note is electronically signed by: Doy Mince 03/27/2017 3:29 PM

## 2017-03-27 NOTE — Assessment & Plan Note (Addendum)
Stage IIIA (pT2pN1AM0) left breast cancer in the lower-outer quadrant, ER/PR/HER2 NEGATIVE, S/P left lumpectomy with positive margins by Dr. Ninfa Linden on 01/22/2017 with one positive left axillary node with positive for metastatic disease resulting in a re-excision on 01/30/2017 and left axillary lymph node dissection with invasive component focally 0.1-0.2 cm away from anterior margin but inked margin surface is negative and 0/6 benign lymph nodes.  Pre-treatment labs CBC diff, CMET.  I personally reviewed and went over laboratory results with the patient.  The results are noted within this dictation.  Labs satisfy treatment parameters.  Mild anemia noted, secondary to chemotherapy.  We discussed Taxol-specific side effects including myelosuppression with dose limiting neutropenia nadir at day 8-10 and recovery by day 15-21, hypersensitivity reaction occuring in 20-40% of patients, neurotoxicity mainly in the form of sensory neuropathy with numbness and paresthesias, transient asymptomatic bradycardia is the most commonly observed cardiotoxicity, alopecia occurs in nearly all patients with total body hair loss, mucositis and/or diarrhea in 30-40% of patients, transient elevations in serum transaminases, bilirubin, and alkaline phophatase, and onycholysis which is mainly observed in those receiving >6 courses on a weekly schedule and not typical in those receiving paclitaxel on an every 3 week schedule.  She reports abdominal bloating/cramping.  Will try Bentyl 20 mg.  Rx is escribed.  She requests a refill on her phenergan.  Rx is printed.  She will return to embark on cycle #1 of weekly Paclitaxel.  Return in 3 weeks for follow-up with cycle #2 of Paclitaxel.

## 2017-03-27 NOTE — Progress Notes (Signed)
Shannon Logan tolerated chemo tx with Neulasta on-pro well without complaints or incident. Labs reviewed with Dr. Talbert Cage prior to administering chemotherapy. VSS upon discharge. Neulasta on-pro applied to pt's right arm with green indicator light flashing. Pt discharged self ambulatory in satisfactory condition accompanied by her family

## 2017-03-27 NOTE — Patient Instructions (Signed)
[facility] Discharge Instructions for Patients Receiving Chemotherapy   Beginning January 23rd 2017 lab work for the Cancer Center will be done in the  Main lab at [facility] on 1st floor. If you have a lab appointment with the Cancer Center please come in thru the  Main Entrance and check in at the main information desk   Today you received the following chemotherapy agents Adriamycin and Cytoxan as well as Neulasta on-pro. Follow-up as scheduled. Call clinic for any questions or concerns  To help prevent nausea and vomiting after your treatment, we encourage you to take your nausea medication   If you develop nausea and vomiting, or diarrhea that is not controlled by your medication, call the clinic.  The clinic phone number is [phone]. Office hours are Monday-Friday 8:30am-5:00pm.  BELOW ARE SYMPTOMS THAT SHOULD BE REPORTED IMMEDIATELY:  *FEVER GREATER THAN 101.0 F  *CHILLS WITH OR WITHOUT FEVER  NAUSEA AND VOMITING THAT IS NOT CONTROLLED WITH YOUR NAUSEA MEDICATION  *UNUSUAL SHORTNESS OF BREATH  *UNUSUAL BRUISING OR BLEEDING  TENDERNESS IN MOUTH AND THROAT WITH OR WITHOUT PRESENCE OF ULCERS  *URINARY PROBLEMS  *BOWEL PROBLEMS  UNUSUAL RASH Items with * indicate a potential emergency and should be followed up as soon as possible. If you have an emergency after office hours please contact your primary care physician or go to the nearest emergency department.  Please call the clinic during office hours if you have any questions or concerns.   You may also contact the Patient Navigator at [phone] should you have any questions or need assistance in obtaining follow up care.      Resources For Cancer Patients and their Caregivers ? American Cancer Society: Can assist with transportation, wigs, general needs, runs Look Good Feel Better.        1-888-227-6333 ? Cancer Care: Provides financial assistance, online support groups,  medication/co-pay assistance.  1-800-813-HOPE (4673) ? [facility] Assists [place] cancer patients and their families through emotional , educational and financial support.  336-427-4357 ? [place] DSS Where to apply for food stamps, Medicaid and utility assistance. 336-342-1394 ? [facility]: Transportation to medical appointments. 336-347-2287 ? Social Security Administration: May apply for disability if have a Stage IV cancer. 336-342-7796 1-800-772-1213 ? [place] Aging, Disability and Transit Services: Assists with nutrition, care and transit needs. 336-349-2343         

## 2017-03-27 NOTE — Patient Instructions (Addendum)
[place] at [facility] Discharge Instructions  RECOMMENDATIONS MADE BY THE CONSULTANT AND ANY TEST RESULTS WILL BE SENT TO YOUR REFERRING PHYSICIAN.  You were seen today by Kirby Crigler PA-C. Rx for Bentyl and Phenergan given today. Treatment today. Treatment in 2 weeks. Return in 3 weeks for treatment and follow up.    Thank you for choosing [place] at [facility] to provide your oncology and hematology care.  To afford each patient quality time with our provider, please arrive at least 15 minutes before your scheduled appointment time.    If you have a lab appointment with the [facility] please come in thru the  Main Entrance and check in at the main information desk  You need to re-schedule your appointment should you arrive 10 or more minutes late.  We strive to give you quality time with our providers, and arriving late affects you and other patients whose appointments are after yours.  Also, if you no show three or more times for appointments you may be dismissed from the clinic at the providers discretion.     Again, thank you for choosing [facility].  Our hope is that these requests will decrease the amount of time that you wait before being seen by our physicians.       _____________________________________________________________  Should you have questions after your visit to [facility], please contact our office at [phone] between the hours of 8:30 a.m. and 4:30 p.m.  Voicemails left after 4:30 p.m. will not be returned until the following business day.  For prescription refill requests, have your pharmacy contact our office.       Resources For Cancer Patients and their Caregivers ? American Cancer Society: Can assist with transportation, wigs, general needs, runs Look Good Feel Better.        509-248-7630 ? Cancer Care: Provides financial assistance, online support groups,  medication/co-pay assistance.  1-800-813-HOPE (531) 586-8299) ? [facility] Assists [place] cancer patients and their families through emotional , educational and financial support.  9735531545 ? [place] DSS Where to apply for food stamps, Medicaid and utility assistance. 252-014-0046 ? RC[facility]: Transportation to medical appointments. 905 515 9716 ? Social Security Administration: May apply for disability if have a Stage IV cancer. 234-888-2493 769-505-9185 ? [employer], Disability and Transit Services: Assists with nutrition, care and transit needs. [place] Support Programs: @10RELATIVEDAYS @ > Cancer Support Group  2nd Tuesday of the month 1pm-2pm, Journey Room  > Creative Journey  3rd Tuesday of the month 1130am-1pm, Journey Room  > Look Good Feel Better  1st Wednesday of the month 10am-12 noon, Journey Room (Call [facility] to register [phone])

## 2017-04-02 ENCOUNTER — Telehealth (HOSPITAL_COMMUNITY): Payer: Self-pay | Admitting: Oncology

## 2017-04-02 NOTE — Telephone Encounter (Signed)
RETURNED PTS CALL. NO ANSWER LEFT A VM ON HOME PHONE#

## 2017-04-03 ENCOUNTER — Ambulatory Visit (HOSPITAL_COMMUNITY): Payer: BLUE CROSS/BLUE SHIELD | Admitting: Adult Health

## 2017-04-10 ENCOUNTER — Encounter (HOSPITAL_BASED_OUTPATIENT_CLINIC_OR_DEPARTMENT_OTHER): Payer: BLUE CROSS/BLUE SHIELD

## 2017-04-10 ENCOUNTER — Encounter (HOSPITAL_COMMUNITY): Payer: Self-pay

## 2017-04-10 VITALS — BP 116/61 | HR 77 | Temp 98.8°F | Resp 18 | Wt 213.0 lb

## 2017-04-10 DIAGNOSIS — C50512 Malignant neoplasm of lower-outer quadrant of left female breast: Secondary | ICD-10-CM | POA: Diagnosis not present

## 2017-04-10 DIAGNOSIS — Z171 Estrogen receptor negative status [ER-]: Principal | ICD-10-CM

## 2017-04-10 DIAGNOSIS — Z5111 Encounter for antineoplastic chemotherapy: Secondary | ICD-10-CM

## 2017-04-10 LAB — CBC WITH DIFFERENTIAL/PLATELET
BASOS PCT: 0 %
BLASTS: 0 %
Band Neutrophils: 6 %
Basophils Absolute: 0 10*3/uL (ref 0.0–0.1)
Eosinophils Absolute: 0 10*3/uL (ref 0.0–0.7)
Eosinophils Relative: 0 %
HCT: 27.2 % — ABNORMAL LOW (ref 36.0–46.0)
HEMOGLOBIN: 9.2 g/dL — AB (ref 12.0–15.0)
LYMPHS PCT: 21 %
Lymphs Abs: 1.3 10*3/uL (ref 0.7–4.0)
MCH: 31.6 pg (ref 26.0–34.0)
MCHC: 33.8 g/dL (ref 30.0–36.0)
MCV: 93.5 fL (ref 78.0–100.0)
MONOS PCT: 6 %
Metamyelocytes Relative: 0 %
Monocytes Absolute: 0.4 10*3/uL (ref 0.1–1.0)
Myelocytes: 1 %
NEUTROS ABS: 4.3 10*3/uL (ref 1.7–7.7)
NEUTROS PCT: 66 %
NRBC: 0 /100{WBCs}
Other: 0 %
PROMYELOCYTES ABS: 0 %
Platelets: 208 10*3/uL (ref 150–400)
RBC: 2.91 MIL/uL — ABNORMAL LOW (ref 3.87–5.11)
RDW: 17.1 % — ABNORMAL HIGH (ref 11.5–15.5)
WBC: 6 10*3/uL (ref 4.0–10.5)

## 2017-04-10 LAB — COMPREHENSIVE METABOLIC PANEL
ALK PHOS: 92 U/L (ref 38–126)
ALT: 22 U/L (ref 14–54)
ANION GAP: 8 (ref 5–15)
AST: 23 U/L (ref 15–41)
Albumin: 3.6 g/dL (ref 3.5–5.0)
BILIRUBIN TOTAL: 0.5 mg/dL (ref 0.3–1.2)
BUN: 5 mg/dL — ABNORMAL LOW (ref 6–20)
CALCIUM: 8.7 mg/dL — AB (ref 8.9–10.3)
CO2: 26 mmol/L (ref 22–32)
CREATININE: 0.68 mg/dL (ref 0.44–1.00)
Chloride: 106 mmol/L (ref 101–111)
Glucose, Bld: 115 mg/dL — ABNORMAL HIGH (ref 65–99)
Potassium: 3.4 mmol/L — ABNORMAL LOW (ref 3.5–5.1)
Sodium: 140 mmol/L (ref 135–145)
TOTAL PROTEIN: 6.3 g/dL — AB (ref 6.5–8.1)

## 2017-04-10 MED ORDER — DIPHENHYDRAMINE HCL 50 MG/ML IJ SOLN
INTRAMUSCULAR | Status: AC
  Filled 2017-04-10: qty 1

## 2017-04-10 MED ORDER — HEPARIN SOD (PORK) LOCK FLUSH 100 UNIT/ML IV SOLN
500.0000 [IU] | Freq: Once | INTRAVENOUS | Status: AC | PRN
  Administered 2017-04-10: 500 [IU]

## 2017-04-10 MED ORDER — FAMOTIDINE IN NACL 20-0.9 MG/50ML-% IV SOLN
20.0000 mg | Freq: Once | INTRAVENOUS | Status: AC
  Administered 2017-04-10: 20 mg via INTRAVENOUS

## 2017-04-10 MED ORDER — FAMOTIDINE IN NACL 20-0.9 MG/50ML-% IV SOLN
INTRAVENOUS | Status: AC
  Filled 2017-04-10: qty 50

## 2017-04-10 MED ORDER — PACLITAXEL CHEMO INJECTION 300 MG/50ML
80.0000 mg/m2 | Freq: Once | INTRAVENOUS | Status: AC
  Administered 2017-04-10: 168 mg via INTRAVENOUS
  Filled 2017-04-10: qty 28

## 2017-04-10 MED ORDER — SODIUM CHLORIDE 0.9 % IV SOLN
Freq: Once | INTRAVENOUS | Status: AC
  Administered 2017-04-10: 12:00:00 via INTRAVENOUS

## 2017-04-10 MED ORDER — DIPHENHYDRAMINE HCL 50 MG/ML IJ SOLN
50.0000 mg | Freq: Once | INTRAMUSCULAR | Status: AC
  Administered 2017-04-10: 50 mg via INTRAVENOUS

## 2017-04-10 MED ORDER — SODIUM CHLORIDE 0.9% FLUSH
10.0000 mL | INTRAVENOUS | Status: DC | PRN
  Administered 2017-04-10: 10 mL
  Filled 2017-04-10: qty 10

## 2017-04-10 NOTE — Progress Notes (Signed)
Patient presented today for chemotherapy. She reports anticipatory nausea is worse with this visit. Will given premeds to see if this helps.  Labs reviewed with MD prior to giving chemo.  Patient tolerated it well, no issues. Vitals stable and discharged home from clinic ambulatory.follow up as scheduled.

## 2017-04-10 NOTE — Patient Instructions (Signed)
[facility] Discharge Instructions for Patients Receiving Chemotherapy   Beginning January 23rd 2017 lab work for the Cancer Center will be done in the  Main lab at [facility] on 1st floor. If you have a lab appointment with the Cancer Center please come in thru the  Main Entrance and check in at the main information desk   Today you received the following chemotherapy agents   To help prevent nausea and vomiting after your treatment, we encourage you to take your nausea medication     If you develop nausea and vomiting, or diarrhea that is not controlled by your medication, call the clinic.  The clinic phone number is [phone]. Office hours are Monday-Friday 8:30am-5:00pm.  BELOW ARE SYMPTOMS THAT SHOULD BE REPORTED IMMEDIATELY:  *FEVER GREATER THAN 101.0 F  *CHILLS WITH OR WITHOUT FEVER  NAUSEA AND VOMITING THAT IS NOT CONTROLLED WITH YOUR NAUSEA MEDICATION  *UNUSUAL SHORTNESS OF BREATH  *UNUSUAL BRUISING OR BLEEDING  TENDERNESS IN MOUTH AND THROAT WITH OR WITHOUT PRESENCE OF ULCERS  *URINARY PROBLEMS  *BOWEL PROBLEMS  UNUSUAL RASH Items with * indicate a potential emergency and should be followed up as soon as possible. If you have an emergency after office hours please contact your primary care physician or go to the nearest emergency department.  Please call the clinic during office hours if you have any questions or concerns.   You may also contact the Patient Navigator at [phone] should you have any questions or need assistance in obtaining follow up care.      Resources For Cancer Patients and their Caregivers ? American Cancer Society: Can assist with transportation, wigs, general needs, runs Look Good Feel Better.        1-888-227-6333 ? Cancer Care: Provides financial assistance, online support groups, medication/co-pay assistance.  1-800-813-HOPE (4673) ? [facility] Assists [place] cancer  patients and their families through emotional , educational and financial support.  336-427-4357 ? [place] DSS Where to apply for food stamps, Medicaid and utility assistance. 336-342-1394 ? [facility]: Transportation to medical appointments. 336-347-2287 ? Social Security Administration: May apply for disability if have a Stage IV cancer. 336-342-7796 1-800-772-1213 ? [place] Aging, Disability and Transit Services: Assists with nutrition, care and transit needs. 336-349-2343         

## 2017-04-11 ENCOUNTER — Telehealth (HOSPITAL_COMMUNITY): Payer: Self-pay

## 2017-04-11 NOTE — Telephone Encounter (Signed)
24 hour follow up-left message for patient to call us if she has any concerns or questions. 

## 2017-04-17 ENCOUNTER — Encounter (HOSPITAL_COMMUNITY): Payer: Self-pay | Admitting: Adult Health

## 2017-04-17 ENCOUNTER — Encounter (HOSPITAL_BASED_OUTPATIENT_CLINIC_OR_DEPARTMENT_OTHER): Payer: BLUE CROSS/BLUE SHIELD

## 2017-04-17 ENCOUNTER — Encounter (HOSPITAL_COMMUNITY): Payer: BLUE CROSS/BLUE SHIELD | Attending: Hematology & Oncology | Admitting: Adult Health

## 2017-04-17 VITALS — BP 119/65 | HR 82 | Temp 98.6°F | Resp 18 | Wt 210.4 lb

## 2017-04-17 DIAGNOSIS — Z171 Estrogen receptor negative status [ER-]: Secondary | ICD-10-CM | POA: Diagnosis not present

## 2017-04-17 DIAGNOSIS — C773 Secondary and unspecified malignant neoplasm of axilla and upper limb lymph nodes: Secondary | ICD-10-CM

## 2017-04-17 DIAGNOSIS — C50512 Malignant neoplasm of lower-outer quadrant of left female breast: Secondary | ICD-10-CM | POA: Insufficient documentation

## 2017-04-17 DIAGNOSIS — D649 Anemia, unspecified: Secondary | ICD-10-CM

## 2017-04-17 DIAGNOSIS — D473 Essential (hemorrhagic) thrombocythemia: Secondary | ICD-10-CM | POA: Diagnosis not present

## 2017-04-17 DIAGNOSIS — Z5111 Encounter for antineoplastic chemotherapy: Secondary | ICD-10-CM

## 2017-04-17 LAB — CBC WITH DIFFERENTIAL/PLATELET
Basophils Absolute: 0.1 10*3/uL (ref 0.0–0.1)
Basophils Relative: 1 %
EOS PCT: 0 %
Eosinophils Absolute: 0 10*3/uL (ref 0.0–0.7)
HCT: 28.4 % — ABNORMAL LOW (ref 36.0–46.0)
HEMOGLOBIN: 9.5 g/dL — AB (ref 12.0–15.0)
Lymphocytes Relative: 22 %
Lymphs Abs: 1.6 10*3/uL (ref 0.7–4.0)
MCH: 31.6 pg (ref 26.0–34.0)
MCHC: 33.5 g/dL (ref 30.0–36.0)
MCV: 94.4 fL (ref 78.0–100.0)
MONOS PCT: 13 %
Monocytes Absolute: 1 10*3/uL (ref 0.1–1.0)
NEUTROS PCT: 64 %
Neutro Abs: 4.5 10*3/uL (ref 1.7–7.7)
PLATELETS: 517 10*3/uL — AB (ref 150–400)
RBC: 3.01 MIL/uL — ABNORMAL LOW (ref 3.87–5.11)
RDW: 18.4 % — AB (ref 11.5–15.5)
WBC: 7.2 10*3/uL (ref 4.0–10.5)

## 2017-04-17 LAB — FERRITIN: FERRITIN: 375 ng/mL — AB (ref 11–307)

## 2017-04-17 LAB — COMPREHENSIVE METABOLIC PANEL
ALBUMIN: 3.8 g/dL (ref 3.5–5.0)
ALK PHOS: 89 U/L (ref 38–126)
ALT: 46 U/L (ref 14–54)
ANION GAP: 8 (ref 5–15)
AST: 32 U/L (ref 15–41)
BUN: 14 mg/dL (ref 6–20)
CHLORIDE: 105 mmol/L (ref 101–111)
CO2: 24 mmol/L (ref 22–32)
Calcium: 9.2 mg/dL (ref 8.9–10.3)
Creatinine, Ser: 0.71 mg/dL (ref 0.44–1.00)
GFR calc non Af Amer: >60 mL/min (ref >60)
GLUCOSE: 113 mg/dL — AB (ref 65–99)
POTASSIUM: 3.8 mmol/L (ref 3.5–5.1)
SODIUM: 137 mmol/L (ref 135–145)
Total Bilirubin: 0.4 mg/dL (ref 0.3–1.2)
Total Protein: 6.8 g/dL (ref 6.5–8.1)

## 2017-04-17 LAB — IRON AND TIBC
IRON: 32 ug/dL (ref 28–170)
Saturation Ratios: 11 % (ref 10.4–31.8)
TIBC: 286 ug/dL (ref 250–450)
UIBC: 254 ug/dL

## 2017-04-17 MED ORDER — FAMOTIDINE IN NACL 20-0.9 MG/50ML-% IV SOLN
INTRAVENOUS | Status: AC
  Filled 2017-04-17: qty 50

## 2017-04-17 MED ORDER — FAMOTIDINE IN NACL 20-0.9 MG/50ML-% IV SOLN
20.0000 mg | Freq: Once | INTRAVENOUS | Status: AC
  Administered 2017-04-17: 20 mg via INTRAVENOUS

## 2017-04-17 MED ORDER — HEPARIN SOD (PORK) LOCK FLUSH 100 UNIT/ML IV SOLN
500.0000 [IU] | Freq: Once | INTRAVENOUS | Status: AC | PRN
  Administered 2017-04-17: 500 [IU]

## 2017-04-17 MED ORDER — DIPHENHYDRAMINE HCL 50 MG/ML IJ SOLN
INTRAMUSCULAR | Status: AC
  Filled 2017-04-17: qty 1

## 2017-04-17 MED ORDER — SODIUM CHLORIDE 0.9% FLUSH
10.0000 mL | INTRAVENOUS | Status: DC | PRN
  Administered 2017-04-17: 10 mL
  Filled 2017-04-17: qty 10

## 2017-04-17 MED ORDER — DIPHENHYDRAMINE HCL 50 MG/ML IJ SOLN
50.0000 mg | Freq: Once | INTRAMUSCULAR | Status: AC
  Administered 2017-04-17: 25 mg via INTRAVENOUS

## 2017-04-17 MED ORDER — PACLITAXEL CHEMO INJECTION 300 MG/50ML
80.0000 mg/m2 | Freq: Once | INTRAVENOUS | Status: AC
  Administered 2017-04-17: 168 mg via INTRAVENOUS
  Filled 2017-04-17: qty 28

## 2017-04-17 MED ORDER — SODIUM CHLORIDE 0.9 % IV SOLN
Freq: Once | INTRAVENOUS | Status: AC
  Administered 2017-04-17: 13:00:00 via INTRAVENOUS

## 2017-04-17 NOTE — Progress Notes (Signed)
Chemotherapy given today per orders. Patient tolerated it well without problems. Additional labs drawn per orders. Vitals stable and discharged home from clinic ambulatory. Follow up as scheduled.

## 2017-04-17 NOTE — Progress Notes (Signed)
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    BRIEF ONCOLOGIC HISTORY:    Breast cancer of lower-outer quadrant of left female breast ([place])   11/19/2016 Mammogram    In the lateral aspect of the left breast, posterior depth, there is an irregular spiculated mass measuring approximately 1.8 cm. There are a few pleomorphic calcifications in a linear distribution within the mass and extending slightly anterior to the mass. All together the mass and calcifications span approximately 2.5 cm.      11/21/2016 Procedure    Breast, left, needle core biopsy, 3:30 o'clock, 8 cm fn      11/22/2016 Pathology Results    Breast, left, needle core biopsy, 3:30 o'clock, 8 cm fn - INVASIVE DUCTAL CARCINOMA, GRADE 2.      01/03/2017 Initial Diagnosis    Breast cancer of lower-outer quadrant of left female breast ([place])     01/22/2017 Procedure    Left breast lumpectomy and left axillary sentinel node biopsy by Dr. Ninfa Linden      01/22/2017 Procedure    Port placed by Dr. Ninfa Linden      01/23/2017 Pathology Results    1. Breast, lumpectomy, Left - INVASIVE GRADE 3 DUCTAL CARCINOMA, SPANNING 2.2 CM IN GREATEST DIMENSION. 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 - LYMPH/VASCULAR INVASION IS PRESENT. - ANTERIOR / MEDIAL MARGIN IS FOCALLY POSITIVE FOR INVASIVE DUCTAL CARCINOMA AND ANTERIOR MARGIN IS BROADLY POSITIVE FOR INVASIVE DUCTAL CARCINOMA. - DUCTAL CARCINOMA IN SITU IS FOCALLY LESS THAN 0.1 CM TO POSTERIOR MARGIN. - OTHER MARGINS ARE NEGATIVE. - SEE ONCOLOGY TEMPLATE. 2. Lymph node, sentinel, biopsy, Left axillary - ONE  BENIGN LYMPH NODE WITH NO TUMOR SEEN (0/1). 3. Lymph node, sentinel, biopsy, Left axillary - ONE LYMPH NODE POSITIVE FOR METASTATIC DUCTAL CARCINOMA (1/1).      01/30/2017 Procedure    Breast, excision, Left, new anterior and medial margins and Lymph nodes, regional resection, Left axillary contents      01/31/2017 Pathology Results    Diagnosis 1. Breast, excision, Left, new anterior and medial margins - RESIDUAL INVASIVE DUCTAL CARCINOMA AND EXTENSIVE RESIDUAL DUCTAL CARCINOMA IN SITU. - INVASIVE DUCTAL CARCINOMA IS FOCALLY 0.1 TO 0.2 CM AWAY FROM ANTERIOR MARGIN BUT INKED MARGINAL SURFACE IS NEGATIVE. - DUCTAL CARCINOMA IN SITU IS FOCALLY LESS THAN 0.1 CM AWAY FROM ANTERIOR MARGIN AND FOCALLY LESS THAN 0.1 CM AWAY FROM MEDIAL ASPECT OF ANTERIOR MARGIN. - SEE COMMENT. 2. Lymph nodes, regional resection, Left axillary contents - SIX BENIGN LYMPH NODES WITH NO TUMOR SEEN (0/6).      02/10/2017 Imaging    MUGA- LEFT ventricular ejection fraction of 54%.  No focal wall motion abnormalities.       02/12/2017 Imaging    CT CAP- 1. Postoperative changes of recent left lumpectomy and left axillary lymph node dissection, without evidence of metastatic disease. 2. Aortic atherosclerosis (ICD10-170.0). Coronary artery calcification. 3. Liver appears fatty.      02/13/2017 -  Chemotherapy    The patient had DOXOrubicin (ADRIAMYCIN) chemo injection 126 mg, 60 mg/m2 = 126 mg, Intravenous,  Once, 1 of 4 cycles  palonosetron (ALOXI) injection 0.25 mg, 0.25 mg, Intravenous,  Once, 1 of 4 cycles  pegfilgrastim (NEULASTA ONPRO [place]) injection 6 mg, 6 mg, Subcutaneous, Once, 1 of 5 cycles  cyclophosphamide (CYTOXAN) 1,260 mg in sodium chloride 0.9 % 250 mL chemo infusion, 600 mg/m2 = 1,260 mg, Intravenous,  Once, 1 of 4 cycles  PACLitaxel (TAXOL) 168 mg in dextrose 5 % 250 mL chemo infusion ( fosaprepitant (EMEND) 150 mg, dexamethasone (DECADRON) 12 mg in sodium chloride 0.9 % 145 mL IVPB, ,  Intravenous,  Once, 1 of 4 cycles  for chemotherapy treatment.          HISTORY OF PRESENT ILLNESS:  (From Kirby Crigler, PA-C's last note on 03/27/17)     INTERVAL HISTORY:  Ms. Shannon Logan 50 y.o. female returns for follow-up and consideration for next cycle of chemotherapy.   She is due for cycle #2 Taxol today. She tells me that she is tolerating the Taxol much better than she did the Adriamycin/Cytoxan chemotherapy.  She continues to have some fatigue, but it improving; she takes rest breaks when needed.  Denies any peripheral neuropathy.    Endorses some vaginal discharge; denies itching, erythema/edema, or pain. Denies dysuria, hematuria, or vaginal bleeding.  States there is a slight odor at times.  She tells me that she went through menopause in her mid-40s.   Overall, she feels like she is improving since completing "the harder chemo." She feels ready to proceed with chemotherapy today.    REVIEW OF SYSTEMS:  Review of Systems  Constitutional: Positive for fatigue (improving).  HENT:  Negative.   Eyes: Negative.   Respiratory: Negative.  Negative for cough and shortness of breath.   Cardiovascular: Negative.   Gastrointestinal: Negative.  Negative for abdominal pain, blood in stool, constipation, diarrhea, nausea and vomiting.  Endocrine: Negative.   Genitourinary: Positive for vaginal discharge. Negative for difficulty urinating, dysuria, hematuria and vaginal bleeding.   Musculoskeletal: Negative.   Skin: Negative.   Neurological: Negative.  Negative for numbness.  Hematological: Negative.   Psychiatric/Behavioral: Negative.      PAST MEDICAL/SURGICAL HISTORY:  Past Medical History:  Diagnosis Date  . Breast cancer of lower-outer quadrant of left female breast ([place]) 01/03/2017  . Cancer ([place]) 11/22/2016   Breast   . Family history of breast cancer   . GERD (gastroesophageal reflux disease)    Past Surgical History:  Procedure Laterality Date  . BREAST LUMPECTOMY  WITH AXILLARY LYMPH NODE DISSECTION Left 01/30/2017   Procedure: RE-EXCISION OF LEFT BREAST LUMPECTOMY AND LEFT AXILLARY LYMPH NODE DISSECTION;  Surgeon: Coralie Keens, MD;  Location: [facility];  Service: General;  Laterality: Left;  . BREAST LUMPECTOMY WITH RADIOACTIVE SEED AND SENTINEL LYMPH NODE BIOPSY Left 01/22/2017   Procedure: BREAST LUMPECTOMY WITH RADIOACTIVE SEED AND SENTINEL LYMPH NODE BIOPSY;  Surgeon: Coralie Keens, MD;  Location: [facility];  Service: General;  Laterality: Left;  . COLONOSCOPY N/A 10/26/2015   Procedure: COLONOSCOPY;  Surgeon: Rogene Houston, MD;  [facility];  Service: Endoscopy;  Laterality: N/A;  . DILATION AND CURETTAGE OF UTERUS    . ESOPHAGOGASTRODUODENOSCOPY N/A 10/26/2015   Procedure: ESOPHAGOGASTRODUODENOSCOPY (EGD);  Surgeon: Rogene Houston, MD;  [facility];  Service: Endoscopy;  Laterality: N/A;  1:00  . PORTACATH PLACEMENT Right 01/22/2017   Procedure: INSERTION PORT-A-CATH;  Surgeon: Coralie Keens, MD;  Location: [facility];  Service: General;  Laterality: Right;  . URETHRAL DILATION       SOCIAL HISTORY:  Social History   Social  History  . Marital status: Married    Spouse name: [name]  . Number of children: 3  . Years of education: N/A   Occupational History  . Not on file.   Social History Main Topics  . Smoking status: Former Smoker    Quit date: 07/23/2008  . Smokeless tobacco: Never Used     Comment: quit smoking 07/23/2008 after smoking 30 yrs. (1/2 pack a day)  . Alcohol use 0.0 oz/week     Comment: rare  . Drug use: No  . Sexual activity: Yes   Other Topics Concern  . Not on file   Social History Narrative  . No narrative on file    FAMILY HISTORY:  Family History  Problem Relation Age of Onset  . Hypertension Mother   . Hyperlipidemia Mother   . Heart disease Father   . Hypertension Father   . Diabetes Father   . Aneurysm Maternal Grandmother    . Stroke Maternal Grandfather   . Heart disease Paternal Grandfather   . Breast cancer Other     PGF's sister  . Breast cancer Other     PGF's sister  . Breast cancer Other     PGF's aunt (great, great aunt)  . Breast cancer Other     PGM's mother    CURRENT MEDICATIONS:  Outpatient Encounter Prescriptions as of 04/17/2017  Medication Sig  . acetaminophen (TYLENOL) 500 MG tablet Take 500 mg by mouth every 6 (six) hours as needed.  . CYCLOPHOSPHAMIDE IV Inject into the vein.  Marland Kitchen dicyclomine (BENTYL) 20 MG tablet Take 1 tablet (20 mg total) by mouth 3 (three) times daily before meals.  . lidocaine-prilocaine (EMLA) cream Apply to affected area once  . Misc. 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  . pantoprazole (PROTONIX) 40 MG tablet Take 1 tablet (40 mg total) by mouth daily before breakfast.  . Pegfilgrastim (NEULASTA ONPRO [place]) Inject into the skin.  . promethazine (PHENERGAN) 25 MG tablet Take 1 tablet (25 mg total) by mouth every 6 (six) hours as needed for nausea or vomiting.  . ondansetron (ZOFRAN) 8 MG tablet Take 1 tablet (8 mg total) by mouth 2 (two) times daily as needed. Start on the third day after chemotherapy. (Patient not taking: Reported on 04/17/2017)  . prochlorperazine (COMPAZINE) 10 MG tablet Take 1 Tablet by mouth every 6 hours as needed for nausea & vomiting   No facility-administered encounter medications on file as of 04/17/2017.     ALLERGIES:  Allergies  Allergen Reactions  . Asa [Aspirin]     Ate a lot of baby aspirin when she was small, mom was told she was allergic, not sure the reaction  . Prednisone     Numbness in face and arms  . Sulfa Antibiotics      PHYSICAL EXAM:  ECOG Performance status: 1 - Symptomatic, but independent.      Physical Exam  Constitutional: She is oriented to person, place, and time and well-developed, well-nourished, and in no distress.  Seen lying in chemo  bed in infusion area.   HENT:  Head: Normocephalic.  Eyes: Conjunctivae are normal. No scleral icterus.  Neck: Normal range of motion. Neck supple.  Cardiovascular: Normal rate, regular rhythm and normal heart sounds.   Pulmonary/Chest: Effort normal and breath sounds normal. No respiratory distress. She has no wheezes. She has no rales.  Abdominal: Soft. Bowel sounds are normal. There is no tenderness. There is  no rebound and no guarding.  Musculoskeletal: Normal range of motion. She exhibits no edema.  Lymphadenopathy:    She has no cervical adenopathy.       Right: No supraclavicular adenopathy present.       Left: No supraclavicular adenopathy present.  Neurological: She is alert and oriented to person, place, and time. No cranial nerve deficit.  Skin: Skin is warm and dry. No rash noted.  Psychiatric: Mood, memory, affect and judgment normal.  Nursing note and vitals reviewed.    LABORATORY DATA:  I have reviewed the labs as listed.  CBC    Component Value Date/Time   WBC 7.2 04/17/2017 1141   RBC 3.01 (L) 04/17/2017 1141   HGB 9.5 (L) 04/17/2017 1141   HCT 28.4 (L) 04/17/2017 1141   PLT 517 (H) 04/17/2017 1141   MCV 94.4 04/17/2017 1141   MCV 90.2 07/02/2013 1523   MCH 31.6 04/17/2017 1141   MCHC 33.5 04/17/2017 1141   RDW 18.4 (H) 04/17/2017 1141   LYMPHSABS 1.6 04/17/2017 1141   MONOABS 1.0 04/17/2017 1141   EOSABS 0.0 04/17/2017 1141   BASOSABS 0.1 04/17/2017 1141   CMP Latest Ref Rng & Units 04/17/2017 04/10/2017 03/27/2017  Glucose 65 - 99 mg/dL 113(H) 115(H) 119(H)  BUN 6 - 20 mg/dL 14 5(L) 7  Creatinine 0.44 - 1.00 mg/dL 0.71 0.68 0.62  Sodium 135 - 145 mmol/L 137 140 138  Potassium 3.5 - 5.1 mmol/L 3.8 3.4(L) 3.8  Chloride 101 - 111 mmol/L 105 106 104  CO2 22 - 32 mmol/L _0 Calcium 8.9 - 10.3 mg/dL 9.2 8.7(L) 8.8(L)  Total Protein 6.5 - 8.1 g/dL 6.8 6.3(L) 6.6  Total Bilirubin 0.3 - 1.2 mg/dL 0.4 0.5 0.6  Alkaline Phos 38 - 126 U/L 89 92 96  AST 15  - 41 U/L 32 23 21  ALT 14 - 54 U/L 46 22 25    PENDING LABS:    DIAGNOSTIC IMAGING:  *Images and reports reviewed as listed and agree with radiologic findings.  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 Ninfa Linden.  Initiated adjuvant chemotherapy with Adriamycin/Cytoxan x 4 cycles on 02/13/17. Currently undergoing weekly Paclitaxel for 12 planned cycles.  -Due for cycle #2 Taxol today; labs reviewed and are adequate for treatment.  -Reviewed the most common side effects of Taxol, including decreased blood counts and peripheral neuropathy.  States that she had some neuropathy with AC x 4, but her symptoms are resolved now.  She understands that Taxol is cumulative and she has higher risk of developing neuropathy later, generally around cycle #8 or 9, although it can happen anytime. We discussed that often peripheral neuropathy is a dose-limiting side effect of Taxol and we will certainly continue to monitor.  -Continue weekly Taxol.  -Return to cancer center for follow-up visit in 2 weeks to monitor continued chemo tolerance.   Anemia with thrombocytosis:  -Hgb 9.5 and platelets 517,000.  -Could be secondary to iron deficiency. Will add on iron studies (ferritin, iron/TIBC) to labs today.  -Discussed with patient and her family that if she is iron-deficient, we can certainly give her IV iron and it may help improve her fatigue a bit. The biggest contributor to her anemia at this time is chemotherapy, but  iron deficiency could be playing a role as well.  We will let her know the lab results when they are available.   Vaginal discharge:  -Could be secondary to vaginal dryness given that she is menopausal. Recommended she try coconut oil as a vaginal moisturizer. Gave her  instructions for how to use the coconut oil. Also recommended use of lubricant with intercourse.  -Endorses that she has not been sexually active much recently; certainly bacterial vaginosis remains on the differential given reported vaginal odor. Although, I have higher clinical suspicion of vaginal dryness/irritation being the etiology. Encouraged her to continue to monitor and let me know if symptoms do not improve.    Dispo:  -Continue weekly Taxol.  -Return to cancer center for follow-up visit and consideration for cycle #4 Taxol.    All questions were answered to patient's stated satisfaction. Encouraged patient to call with any new concerns or questions before her next visit to the cancer center and we can certain see her sooner, if needed.    Plan of care discussed with Dr. Talbert Cage, who agrees with the above aforementioned.     Orders placed this encounter:  Orders Placed This Encounter  Procedures  . Ferritin  . Iron and TIBC      Mike Craze, NP [place] [phone]

## 2017-04-17 NOTE — Patient Instructions (Signed)
[facility] Discharge Instructions  RECOMMENDATIONS MADE BY THE CONSULTANT AND ANY TEST RESULTS WILL BE SENT TO YOUR REFERRING PHYSICIAN.  You were seen today by Gretchen Dawson NP.   Thank you for choosing [facility] at [facility] to provide your oncology and hematology care.  To afford each patient quality time with our provider, please arrive at least 15 minutes before your scheduled appointment time.    If you have a lab appointment with the Cancer Center please come in thru the  Main Entrance and check in at the main information desk  You need to re-schedule your appointment should you arrive 10 or more minutes late.  We strive to give you quality time with our providers, and arriving late affects you and other patients whose appointments are after yours.  Also, if you no show three or more times for appointments you may be dismissed from the clinic at the providers discretion.     Again, thank you for choosing [facility].  Our hope is that these requests will decrease the amount of time that you wait before being seen by our physicians.       _____________________________________________________________  Should you have questions after your visit to [facility], please contact our office at [phone] between the hours of 8:30 a.m. and 4:30 p.m.  Voicemails left after 4:30 p.m. will not be returned until the following business day.  For prescription refill requests, have your pharmacy contact our office.       Resources For Cancer Patients and their Caregivers ? American Cancer Society: Can assist with transportation, wigs, general needs, runs Look Good Feel Better.        1-888-227-6333 ? Cancer Care: Provides financial assistance, online support groups, medication/co-pay assistance.  1-800-813-HOPE (4673) ? [facility] Assists [place] cancer patients and  their families through emotional , educational and financial support.  336-427-4357 ? [place] DSS Where to apply for food stamps, Medicaid and utility assistance. 336-342-1394 ? [facility]: Transportation to medical appointments. 336-347-2287 ? Social Security Administration: May apply for disability if have a Stage IV cancer. 336-342-7796 1-800-772-1213 ? [place] Aging, Disability and Transit Services: Assists with nutrition, care and transit needs. 336-349-2343  Cancer Center Support Programs: [email or web address]@ > Cancer Support Group  2nd Tuesday of the month 1pm-2pm, Journey Room  > Creative Journey  3rd Tuesday of the month 1130am-1pm, Journey Room  > Look Good Feel Better  1st Wednesday of the month 10am-12 noon, Journey Room (Call American Cancer Society to register 1-800-395-5775)    

## 2017-04-17 NOTE — Patient Instructions (Signed)
[facility] Discharge Instructions for Patients Receiving Chemotherapy   Beginning January 23rd 2017 lab work for the Cancer Center will be done in the  Main lab at [facility] on 1st floor. If you have a lab appointment with the Cancer Center please come in thru the  Main Entrance and check in at the main information desk   Today you received the following chemotherapy agents   To help prevent nausea and vomiting after your treatment, we encourage you to take your nausea medication     If you develop nausea and vomiting, or diarrhea that is not controlled by your medication, call the clinic.  The clinic phone number is [phone]. Office hours are Monday-Friday 8:30am-5:00pm.  BELOW ARE SYMPTOMS THAT SHOULD BE REPORTED IMMEDIATELY:  *FEVER GREATER THAN 101.0 F  *CHILLS WITH OR WITHOUT FEVER  NAUSEA AND VOMITING THAT IS NOT CONTROLLED WITH YOUR NAUSEA MEDICATION  *UNUSUAL SHORTNESS OF BREATH  *UNUSUAL BRUISING OR BLEEDING  TENDERNESS IN MOUTH AND THROAT WITH OR WITHOUT PRESENCE OF ULCERS  *URINARY PROBLEMS  *BOWEL PROBLEMS  UNUSUAL RASH Items with * indicate a potential emergency and should be followed up as soon as possible. If you have an emergency after office hours please contact your primary care physician or go to the nearest emergency department.  Please call the clinic during office hours if you have any questions or concerns.   You may also contact the Patient Navigator at [phone] should you have any questions or need assistance in obtaining follow up care.      Resources For Cancer Patients and their Caregivers ? American Cancer Society: Can assist with transportation, wigs, general needs, runs Look Good Feel Better.        1-888-227-6333 ? Cancer Care: Provides financial assistance, online support groups, medication/co-pay assistance.  1-800-813-HOPE (4673) ? [facility] Assists [place] cancer  patients and their families through emotional , educational and financial support.  336-427-4357 ? [place] DSS Where to apply for food stamps, Medicaid and utility assistance. 336-342-1394 ? [facility]: Transportation to medical appointments. 336-347-2287 ? Social Security Administration: May apply for disability if have a Stage IV cancer. 336-342-7796 1-800-772-1213 ? [place] Aging, Disability and Transit Services: Assists with nutrition, care and transit needs. 336-349-2343         

## 2017-04-24 ENCOUNTER — Encounter (HOSPITAL_COMMUNITY): Payer: Self-pay

## 2017-04-24 ENCOUNTER — Encounter (HOSPITAL_BASED_OUTPATIENT_CLINIC_OR_DEPARTMENT_OTHER): Payer: BLUE CROSS/BLUE SHIELD

## 2017-04-24 VITALS — BP 127/56 | HR 77 | Temp 98.6°F | Resp 18 | Wt 212.8 lb

## 2017-04-24 DIAGNOSIS — Z5111 Encounter for antineoplastic chemotherapy: Secondary | ICD-10-CM

## 2017-04-24 DIAGNOSIS — C50512 Malignant neoplasm of lower-outer quadrant of left female breast: Secondary | ICD-10-CM

## 2017-04-24 DIAGNOSIS — Z171 Estrogen receptor negative status [ER-]: Principal | ICD-10-CM

## 2017-04-24 MED ORDER — HEPARIN SOD (PORK) LOCK FLUSH 100 UNIT/ML IV SOLN
500.0000 [IU] | Freq: Once | INTRAVENOUS | Status: AC | PRN
Start: 2017-04-24 — End: 2017-04-24
  Administered 2017-04-24: 500 [IU]
  Filled 2017-04-24: qty 5

## 2017-04-24 MED ORDER — SODIUM CHLORIDE 0.9% FLUSH
10.0000 mL | INTRAVENOUS | Status: DC | PRN
  Administered 2017-04-24: 10 mL
  Filled 2017-04-24: qty 10

## 2017-04-24 MED ORDER — FAMOTIDINE IN NACL 20-0.9 MG/50ML-% IV SOLN
20.0000 mg | Freq: Once | INTRAVENOUS | Status: AC
  Administered 2017-04-24: 20 mg via INTRAVENOUS
  Filled 2017-04-24: qty 50

## 2017-04-24 MED ORDER — PACLITAXEL CHEMO INJECTION 300 MG/50ML
80.0000 mg/m2 | Freq: Once | INTRAVENOUS | Status: AC
  Administered 2017-04-24: 168 mg via INTRAVENOUS
  Filled 2017-04-24: qty 28

## 2017-04-24 MED ORDER — SODIUM CHLORIDE 0.9 % IV SOLN
Freq: Once | INTRAVENOUS | Status: AC
  Administered 2017-04-24: 12:00:00 via INTRAVENOUS

## 2017-04-24 MED ORDER — DIPHENHYDRAMINE HCL 50 MG/ML IJ SOLN
50.0000 mg | Freq: Once | INTRAMUSCULAR | Status: AC
  Administered 2017-04-24: 25 mg via INTRAVENOUS
  Filled 2017-04-24: qty 1

## 2017-04-24 NOTE — Progress Notes (Signed)
Labs from the 3rd were reviewed with MD, proceed with treatment today.  Chemotherapy given today per orders. Patient tolerated it well without problems. Vitals stable and discharged home from clinic ambulatory. Follow up as scheduled.

## 2017-04-24 NOTE — Patient Instructions (Signed)
[facility] Discharge Instructions for Patients Receiving Chemotherapy   Beginning January 23rd 2017 lab work for the Cancer Center will be done in the  Main lab at [facility] on 1st floor. If you have a lab appointment with the Cancer Center please come in thru the  Main Entrance and check in at the main information desk   Today you received the following chemotherapy agents   To help prevent nausea and vomiting after your treatment, we encourage you to take your nausea medication     If you develop nausea and vomiting, or diarrhea that is not controlled by your medication, call the clinic.  The clinic phone number is [phone]. Office hours are Monday-Friday 8:30am-5:00pm.  BELOW ARE SYMPTOMS THAT SHOULD BE REPORTED IMMEDIATELY:  *FEVER GREATER THAN 101.0 F  *CHILLS WITH OR WITHOUT FEVER  NAUSEA AND VOMITING THAT IS NOT CONTROLLED WITH YOUR NAUSEA MEDICATION  *UNUSUAL SHORTNESS OF BREATH  *UNUSUAL BRUISING OR BLEEDING  TENDERNESS IN MOUTH AND THROAT WITH OR WITHOUT PRESENCE OF ULCERS  *URINARY PROBLEMS  *BOWEL PROBLEMS  UNUSUAL RASH Items with * indicate a potential emergency and should be followed up as soon as possible. If you have an emergency after office hours please contact your primary care physician or go to the nearest emergency department.  Please call the clinic during office hours if you have any questions or concerns.   You may also contact the Patient Navigator at [phone] should you have any questions or need assistance in obtaining follow up care.      Resources For Cancer Patients and their Caregivers ? American Cancer Society: Can assist with transportation, wigs, general needs, runs Look Good Feel Better.        1-888-227-6333 ? Cancer Care: Provides financial assistance, online support groups, medication/co-pay assistance.  1-800-813-HOPE (4673) ? [facility] Assists [place] cancer  patients and their families through emotional , educational and financial support.  336-427-4357 ? [place] DSS Where to apply for food stamps, Medicaid and utility assistance. 336-342-1394 ? [facility]: Transportation to medical appointments. 336-347-2287 ? Social Security Administration: May apply for disability if have a Stage IV cancer. 336-342-7796 1-800-772-1213 ? [place] Aging, Disability and Transit Services: Assists with nutrition, care and transit needs. 336-349-2343         

## 2017-05-01 ENCOUNTER — Encounter (HOSPITAL_COMMUNITY): Payer: Self-pay | Admitting: Oncology

## 2017-05-01 ENCOUNTER — Encounter (HOSPITAL_COMMUNITY): Payer: BLUE CROSS/BLUE SHIELD | Attending: Oncology | Admitting: Oncology

## 2017-05-01 ENCOUNTER — Encounter (HOSPITAL_COMMUNITY): Payer: BLUE CROSS/BLUE SHIELD | Attending: Oncology

## 2017-05-01 ENCOUNTER — Ambulatory Visit (HOSPITAL_COMMUNITY): Payer: BLUE CROSS/BLUE SHIELD

## 2017-05-01 VITALS — BP 126/73 | HR 77 | Temp 98.1°F | Resp 18 | Wt 211.6 lb

## 2017-05-01 DIAGNOSIS — C50512 Malignant neoplasm of lower-outer quadrant of left female breast: Secondary | ICD-10-CM

## 2017-05-01 DIAGNOSIS — Z5111 Encounter for antineoplastic chemotherapy: Secondary | ICD-10-CM

## 2017-05-01 DIAGNOSIS — Z171 Estrogen receptor negative status [ER-]: Secondary | ICD-10-CM | POA: Diagnosis not present

## 2017-05-01 DIAGNOSIS — C773 Secondary and unspecified malignant neoplasm of axilla and upper limb lymph nodes: Secondary | ICD-10-CM | POA: Diagnosis not present

## 2017-05-01 LAB — CBC WITH DIFFERENTIAL/PLATELET
BASOS ABS: 0 10*3/uL (ref 0.0–0.1)
Basophils Relative: 0 %
EOS PCT: 1 %
Eosinophils Absolute: 0 10*3/uL (ref 0.0–0.7)
HEMATOCRIT: 32.3 % — AB (ref 36.0–46.0)
Hemoglobin: 10.6 g/dL — ABNORMAL LOW (ref 12.0–15.0)
LYMPHS ABS: 1.1 10*3/uL (ref 0.7–4.0)
LYMPHS PCT: 15 %
MCH: 32.8 pg (ref 26.0–34.0)
MCHC: 32.8 g/dL (ref 30.0–36.0)
MCV: 100 fL (ref 78.0–100.0)
MONO ABS: 1 10*3/uL (ref 0.1–1.0)
Monocytes Relative: 14 %
NEUTROS ABS: 5.2 10*3/uL (ref 1.7–7.7)
Neutrophils Relative %: 70 %
PLATELETS: 338 10*3/uL (ref 150–400)
RBC: 3.23 MIL/uL — ABNORMAL LOW (ref 3.87–5.11)
RDW: 20.8 % — AB (ref 11.5–15.5)
WBC: 7.4 10*3/uL (ref 4.0–10.5)

## 2017-05-01 LAB — COMPREHENSIVE METABOLIC PANEL
ALT: 36 U/L (ref 14–54)
AST: 30 U/L (ref 15–41)
Albumin: 4 g/dL (ref 3.5–5.0)
Alkaline Phosphatase: 84 U/L (ref 38–126)
Anion gap: 7 (ref 5–15)
BILIRUBIN TOTAL: 0.9 mg/dL (ref 0.3–1.2)
BUN: 11 mg/dL (ref 6–20)
CHLORIDE: 105 mmol/L (ref 101–111)
CO2: 26 mmol/L (ref 22–32)
CREATININE: 0.7 mg/dL (ref 0.44–1.00)
Calcium: 9.4 mg/dL (ref 8.9–10.3)
Glucose, Bld: 106 mg/dL — ABNORMAL HIGH (ref 65–99)
POTASSIUM: 3.9 mmol/L (ref 3.5–5.1)
Sodium: 138 mmol/L (ref 135–145)
TOTAL PROTEIN: 7.1 g/dL (ref 6.5–8.1)

## 2017-05-01 MED ORDER — DIPHENHYDRAMINE HCL 50 MG/ML IJ SOLN
50.0000 mg | Freq: Once | INTRAMUSCULAR | Status: AC
  Administered 2017-05-01: 50 mg via INTRAVENOUS
  Filled 2017-05-01: qty 1

## 2017-05-01 MED ORDER — SODIUM CHLORIDE 0.9 % IV SOLN
Freq: Once | INTRAVENOUS | Status: AC
  Administered 2017-05-01: 10:00:00 via INTRAVENOUS

## 2017-05-01 MED ORDER — SODIUM CHLORIDE 0.9% FLUSH
10.0000 mL | INTRAVENOUS | Status: DC | PRN
  Administered 2017-05-01: 10 mL
  Filled 2017-05-01: qty 10

## 2017-05-01 MED ORDER — HEPARIN SOD (PORK) LOCK FLUSH 100 UNIT/ML IV SOLN
500.0000 [IU] | Freq: Once | INTRAVENOUS | Status: AC | PRN
  Administered 2017-05-01: 500 [IU]
  Filled 2017-05-01: qty 5

## 2017-05-01 MED ORDER — FAMOTIDINE IN NACL 20-0.9 MG/50ML-% IV SOLN
20.0000 mg | Freq: Once | INTRAVENOUS | Status: AC
  Administered 2017-05-01: 20 mg via INTRAVENOUS
  Filled 2017-05-01: qty 50

## 2017-05-01 MED ORDER — PACLITAXEL CHEMO INJECTION 300 MG/50ML
80.0000 mg/m2 | Freq: Once | INTRAVENOUS | Status: AC
  Administered 2017-05-01: 168 mg via INTRAVENOUS
  Filled 2017-05-01: qty 28

## 2017-05-01 NOTE — Patient Instructions (Signed)
[facility] Discharge Instructions  RECOMMENDATIONS MADE BY THE CONSULTANT AND ANY TEST RESULTS WILL BE SENT TO YOUR REFERRING PHYSICIAN.  You were seen today by Dr. Louise Zhou    Thank you for choosing [facility] at [facility] to provide your oncology and hematology care.  To afford each patient quality time with our provider, please arrive at least 15 minutes before your scheduled appointment time.    If you have a lab appointment with the Cancer Center please come in thru the  Main Entrance and check in at the main information desk  You need to re-schedule your appointment should you arrive 10 or more minutes late.  We strive to give you quality time with our providers, and arriving late affects you and other patients whose appointments are after yours.  Also, if you no show three or more times for appointments you may be dismissed from the clinic at the providers discretion.     Again, thank you for choosing [facility].  Our hope is that these requests will decrease the amount of time that you wait before being seen by our physicians.       _____________________________________________________________  Should you have questions after your visit to [facility], please contact our office at [phone] between the hours of 8:30 a.m. and 4:30 p.m.  Voicemails left after 4:30 p.m. will not be returned until the following business day.  For prescription refill requests, have your pharmacy contact our office.       Resources For Cancer Patients and their Caregivers ? American Cancer Society: Can assist with transportation, wigs, general needs, runs Look Good Feel Better.        1-888-227-6333 ? Cancer Care: Provides financial assistance, online support groups, medication/co-pay assistance.  1-800-813-HOPE (4673) ? [facility] Assists [place] cancer patients and their  families through emotional , educational and financial support.  336-427-4357 ? [place] DSS Where to apply for food stamps, Medicaid and utility assistance. 336-342-1394 ? [facility]: Transportation to medical appointments. 336-347-2287 ? Social Security Administration: May apply for disability if have a Stage IV cancer. 336-342-7796 1-800-772-1213 ? [place] Aging, Disability and Transit Services: Assists with nutrition, care and transit needs. 336-349-2343  Cancer Center Support Programs: [email or web address]@ > Cancer Support Group  2nd Tuesday of the month 1pm-2pm, Journey Room  > Creative Journey  3rd Tuesday of the month 1130am-1pm, Journey Room  > Look Good Feel Better  1st Wednesday of the month 10am-12 noon, Journey Room (Call American Cancer Society to register 1-800-395-5775)    

## 2017-05-01 NOTE — Patient Instructions (Addendum)
[facility] Discharge Instructions for Patients Receiving Chemotherapy   Beginning January 23rd 2017 lab work for the Cancer Center will be done in the  Main lab at [place] on 1st floor. If you have a lab appointment with the Cancer Center please come in thru the  Main Entrance and check in at the main information desk   Today you received the following chemotherapy agents   To help prevent nausea and vomiting after your treatment, we encourage you to take your nausea medication     If you develop nausea and vomiting, or diarrhea that is not controlled by your medication, call the clinic.  The clinic phone number is [phone]. Office hours are Monday-Friday 8:30am-5:00pm.  BELOW ARE SYMPTOMS THAT SHOULD BE REPORTED IMMEDIATELY:  *FEVER GREATER THAN 101.0 F  *CHILLS WITH OR WITHOUT FEVER  NAUSEA AND VOMITING THAT IS NOT CONTROLLED WITH YOUR NAUSEA MEDICATION  *UNUSUAL SHORTNESS OF BREATH  *UNUSUAL BRUISING OR BLEEDING  TENDERNESS IN MOUTH AND THROAT WITH OR WITHOUT PRESENCE OF ULCERS  *URINARY PROBLEMS  *BOWEL PROBLEMS  UNUSUAL RASH Items with * indicate a potential emergency and should be followed up as soon as possible. If you have an emergency after office hours please contact your primary care physician or go to the nearest emergency department.  Please call the clinic during office hours if you have any questions or concerns.   You may also contact the Patient Navigator at [phone] should you have any questions or need assistance in obtaining follow up care.      Resources For Cancer Patients and their Caregivers ? American Cancer Society: Can assist with transportation, wigs, general needs, runs Look Good Feel Better.        1-888-227-6333 ? Cancer Care: Provides financial assistance, online support groups, medication/co-pay assistance.  1-800-813-HOPE (4673) ? [facility] Assists [place] cancer  patients and their families through emotional , educational and financial support.  336-427-4357 ? [place] DSS Where to apply for food stamps, Medicaid and utility assistance. 336-342-1394 ? [facility]: Transportation to medical appointments. 336-347-2287 ? Social Security Administration: May apply for disability if have a Stage IV cancer. 336-342-7796 1-800-772-1213 ? [place] Aging, Disability and Transit Services: Assists with nutrition, care and transit needs. 336-349-2343         

## 2017-05-01 NOTE — Progress Notes (Signed)
Shannon Maize, MD [place] [address]   CURRENT THERAPY: adjuvant chemo with AC x4 followed by T x 12 cycles  INTERVAL HISTORY: Shannon Logan 51 y.o. female returns for followup of Stage IIIA (pT2pN1AM0) left breast cancer in the lower-outer quadrant, ER/PR/HER2 NEGATIVE, S/P left lumpectomy with positive margins by Dr. Ninfa Linden on 01/22/2017 with one positive left axillary node with positive for metastatic disease resulting in a re-excision on 01/30/2017 and left axillary lymph node dissection with invasive component focally 0.1-0.2 cm away from anterior margin but inked margin surface is negative and 0/6 benign lymph nodes.    Breast cancer of lower-outer quadrant of left female breast ([place])   11/19/2016 Mammogram    In the lateral aspect of the left breast, posterior depth, there is an irregular spiculated mass measuring approximately 1.8 cm. There are a few pleomorphic calcifications in a linear distribution within the mass and extending slightly anterior to the mass. All together the mass and calcifications span approximately 2.5 cm.      11/21/2016 Procedure    Breast, left, needle core biopsy, 3:30 o'clock, 8 cm fn      11/22/2016 Pathology Results    Breast, left, needle core biopsy, 3:30 o'clock, 8 cm fn - INVASIVE DUCTAL CARCINOMA, GRADE 2.      01/03/2017 Initial Diagnosis    Breast cancer of lower-outer quadrant of left female breast ([place])     01/22/2017 Procedure    Left breast lumpectomy and left axillary sentinel node biopsy by Dr. Ninfa Linden      01/22/2017 Procedure    Port placed by Dr. Ninfa Linden      01/23/2017 Pathology Results    1. Breast, lumpectomy, Left - INVASIVE GRADE 3 DUCTAL CARCINOMA, SPANNING 2.2 CM IN GREATEST DIMENSION. 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 - LYMPH/VASCULAR INVASION IS PRESENT. - ANTERIOR / MEDIAL MARGIN IS FOCALLY POSITIVE FOR INVASIVE DUCTAL CARCINOMA AND ANTERIOR  MARGIN IS BROADLY POSITIVE FOR INVASIVE DUCTAL CARCINOMA. - DUCTAL CARCINOMA IN SITU IS FOCALLY LESS THAN 0.1 CM TO POSTERIOR MARGIN. - OTHER MARGINS ARE NEGATIVE. - SEE ONCOLOGY TEMPLATE. 2. Lymph node, sentinel, biopsy, Left axillary - ONE BENIGN LYMPH NODE WITH NO TUMOR SEEN (0/1). 3. Lymph node, sentinel, biopsy, Left axillary - ONE LYMPH NODE POSITIVE FOR METASTATIC DUCTAL CARCINOMA (1/1).      01/30/2017 Procedure    Breast, excision, Left, new anterior and medial margins and Lymph nodes, regional resection, Left axillary contents      01/31/2017 Pathology Results    Diagnosis 1. Breast, excision, Left, new anterior and medial margins - RESIDUAL INVASIVE DUCTAL CARCINOMA AND EXTENSIVE RESIDUAL DUCTAL CARCINOMA IN SITU. - INVASIVE DUCTAL CARCINOMA IS FOCALLY 0.1 TO 0.2 CM AWAY FROM ANTERIOR MARGIN BUT INKED MARGINAL SURFACE IS NEGATIVE. - DUCTAL CARCINOMA IN SITU IS FOCALLY LESS THAN 0.1 CM AWAY FROM ANTERIOR MARGIN AND FOCALLY LESS THAN 0.1 CM AWAY FROM MEDIAL ASPECT OF ANTERIOR MARGIN. - SEE COMMENT. 2. Lymph nodes, regional resection, Left axillary contents - SIX BENIGN LYMPH NODES WITH NO TUMOR SEEN (0/6).      02/10/2017 Imaging    MUGA- LEFT ventricular ejection fraction of 54%.  No focal wall motion abnormalities.       02/12/2017 Imaging    CT CAP- 1. Postoperative changes of recent left lumpectomy and left axillary lymph node dissection, without evidence of metastatic disease. 2. Aortic atherosclerosis (ICD10-170.0). Coronary artery calcification. 3. Liver appears  fatty.      02/13/2017 -  Chemotherapy    The patient had DOXOrubicin (ADRIAMYCIN) chemo injection 126 mg, 60 mg/m2 = 126 mg, Intravenous,  Once, 1 of 4 cycles  palonosetron (ALOXI) injection 0.25 mg, 0.25 mg, Intravenous,  Once, 1 of 4 cycles  pegfilgrastim (NEULASTA ONPRO KIT) injection 6 mg, 6 mg, Subcutaneous, Once, 1 of 5 cycles  cyclophosphamide (CYTOXAN) 1,260 mg in sodium chloride 0.9 % 250  mL chemo infusion, 600 mg/m2 = 1,260 mg, Intravenous,  Once, 1 of 4 cycles  PACLitaxel (TAXOL) 168 mg in dextrose 5 % 250 mL chemo infusion ( fosaprepitant (EMEND) 150 mg, dexamethasone (DECADRON) 12 mg in sodium chloride 0.9 % 145 mL IVPB, , Intravenous,  Once, 1 of 4 cycles  for chemotherapy treatment.        Patient presents today for continuing follow up. She is here today for cycle #4 of taxol.  She reports that her last cycle was tolerated without difficulty.  She reports mild Grade 1 neuropathy in her hands and feet. She also reports issues with her hands and feet swelling. She notes that they get swollen when she does activity and uses them a lot.    She denies any nausea or vomiting.  She denies any abdominal pain, chest pain, sob, diarrhea, and constipation.  Patient does not have any questions or concerns at this time.    Review of Systems  Constitutional: Negative.  Negative for weight loss.  HENT: Negative.   Eyes: Negative.   Respiratory: Negative.  Negative for shortness of breath.   Cardiovascular: Negative.  Negative for chest pain.  Gastrointestinal: Negative for abdominal pain, constipation, diarrhea, nausea and vomiting.  Genitourinary: Negative.   Musculoskeletal: Negative.        Hands and feet swelling with activity.  Skin: Negative.   Neurological: Positive for tingling (mild neuropathy in hands and feet).  Endo/Heme/Allergies: Negative.   Psychiatric/Behavioral: Negative.     Past Medical History:  Diagnosis Date  . Breast cancer of lower-outer quadrant of left female breast ([place]) 01/03/2017  . Cancer ([place]) 11/22/2016   Breast   . Family history of breast cancer   . GERD (gastroesophageal reflux disease)     Past Surgical History:  Procedure Laterality Date  . BREAST LUMPECTOMY WITH AXILLARY LYMPH NODE DISSECTION Left 01/30/2017   Procedure: RE-EXCISION OF LEFT BREAST LUMPECTOMY AND LEFT AXILLARY LYMPH NODE DISSECTION;  Surgeon: Coralie Keens, MD;   Location: [facility];  Service: General;  Laterality: Left;  . BREAST LUMPECTOMY WITH RADIOACTIVE SEED AND SENTINEL LYMPH NODE BIOPSY Left 01/22/2017   Procedure: BREAST LUMPECTOMY WITH RADIOACTIVE SEED AND SENTINEL LYMPH NODE BIOPSY;  Surgeon: Coralie Keens, MD;  Location: [facility] [facility];  Service: General;  Laterality: Left;  . COLONOSCOPY N/A 10/26/2015   Procedure: COLONOSCOPY;  Surgeon: Rogene Houston, MD;  [facility];  Service: Endoscopy;  Laterality: N/A;  . DILATION AND CURETTAGE OF UTERUS    . ESOPHAGOGASTRODUODENOSCOPY N/A 10/26/2015   Procedure: ESOPHAGOGASTRODUODENOSCOPY (EGD);  Surgeon: Rogene Houston, MD;  [facility];  Service: Endoscopy;  Laterality: N/A;  1:00  . PORTACATH PLACEMENT Right 01/22/2017   Procedure: INSERTION PORT-A-CATH;  Surgeon: Coralie Keens, MD;  Location: [facility];  Service: General;  Laterality: Right;  . URETHRAL DILATION      Family History  Problem Relation Age of Onset  . Hypertension Mother   . Hyperlipidemia Mother   . Heart disease Father   .  Hypertension Father   . Diabetes Father   . Aneurysm Maternal Grandmother   . Stroke Maternal Grandfather   . Heart disease Paternal Grandfather   . Breast cancer Other        PGF's sister  . Breast cancer Other        PGF's sister  . Breast cancer Other        PGF's aunt (great, great aunt)  . Breast cancer Other        PGM's mother    Social History   Social History  . Marital status: Married    Spouse name: [name]  . Number of children: 3  . Years of education: N/A   Social History Main Topics  . Smoking status: Former Smoker    Quit date: 07/23/2008  . Smokeless tobacco: Never Used     Comment: quit smoking 07/23/2008 after smoking 30 yrs. (1/2 pack a day)  . Alcohol use 0.0 oz/week     Comment: rare  . Drug use: No  . Sexual activity: Yes   Other Topics Concern  . None   Social History Narrative  . None      PHYSICAL EXAMINATION  ECOG PERFORMANCE STATUS: 0 - Asymptomatic   Physical Exam  Constitutional: She is oriented to person, place, and time and well-developed, well-nourished, and in no distress.  HENT:  Head: Normocephalic and atraumatic.  Eyes: Conjunctivae and EOM are normal. Pupils are equal, round, and reactive to light.  Neck: Normal range of motion. Neck supple.  Cardiovascular: Normal rate, regular rhythm and normal heart sounds.   Pulmonary/Chest: Effort normal and breath sounds normal.  Abdominal: Soft. Bowel sounds are normal.  Musculoskeletal: Normal range of motion.  Neurological: She is alert and oriented to person, place, and time. Gait normal.  Skin: Skin is warm and dry.  Nursing note and vitals reviewed.    LABORATORY DATA: CBC    Component Value Date/Time   WBC 7.4 05/01/2017 0922   RBC 3.23 (L) 05/01/2017 0922   HGB 10.6 (L) 05/01/2017 0922   HCT 32.3 (L) 05/01/2017 0922   PLT 338 05/01/2017 0922   MCV 100.0 05/01/2017 0922   MCV 90.2 07/02/2013 1523   MCH 32.8 05/01/2017 0922   MCHC 32.8 05/01/2017 0922   RDW 20.8 (H) 05/01/2017 0922   LYMPHSABS 1.1 05/01/2017 0922   MONOABS 1.0 05/01/2017 0922   EOSABS 0.0 05/01/2017 0922   BASOSABS 0.0 05/01/2017 0922      Chemistry      Component Value Date/Time   NA 138 05/01/2017 0922   NA 144 10/25/2016 0910   K 3.9 05/01/2017 0922   CL 105 05/01/2017 0922   CO2 26 05/01/2017 0922   BUN 11 05/01/2017 0922   BUN 13 10/25/2016 0910   CREATININE 0.70 05/01/2017 0922   CREATININE 0.78 07/02/2013 1448      Component Value Date/Time   CALCIUM 9.4 05/01/2017 0922   ALKPHOS 84 05/01/2017 0922   AST 30 05/01/2017 0922   ALT 36 05/01/2017 0922   BILITOT 0.9 05/01/2017 0922   BILITOT 0.9 10/25/2016 0910        PENDING LABS:   RADIOGRAPHIC STUDIES: I have personally reviewed the radiological images as listed and agreed with the findings in the report.  CHEST, ABDOMEN, AND PELVIS WITH CONTRAST  02/12/2017  IMPRESSION: 1. Postoperative changes of recent left lumpectomy and left axillary lymph node dissection, without evidence of metastatic disease. 2. Aortic atherosclerosis (ICD10-170.0). Coronary artery calcification. 3. Liver  appears fatty.   ASSESSMENT AND PLAN:  Stage IIIA (pT2pN1AM0) left breast cancer in the lower-outer quadrant, ER/PR/HER2 NEGATIVE, S/P left lumpectomy with positive margins by Dr. Ninfa Linden on 01/22/2017 with one positive left axillary node with positive for metastatic disease resulting in a re-excision on 01/30/2017 and left axillary lymph node dissection with invasive component focally 0.1-0.2 cm away from anterior margin but inked margin surface is negative and 0/6 benign lymph nodes.  PLAN: Continue with weekly taxol as scheduled. Patient is tolerating treatment very well. Total of 12 cycles planned. Labs from today reviewed. Proceed with cycle 4 of taxol. RTC in 2 weeks for follow up.   THERAPY PLAN:  Adjuvant chemotherapy with AC + T followed by XRT therapy.   All questions were answered. The patient knows to call the clinic with any problems, questions or concerns. We can certainly see the patient much sooner if necessary.  This document serves as a record of services personally performed by Twana First, MD. It was created on her behalf by Shirlean Mylar, a trained medical scribe. The creation of this record is based on the scribe's personal observations and the provider's statements to them. This document has been checked and approved by the attending provider.  I have reviewed the above documentation for accuracy and completeness and I agree with the above.  This note is electronically signed by: Mikey College 05/01/2017 9:57 AM

## 2017-05-01 NOTE — Progress Notes (Signed)
Labs reviewed with MD. Proceed with treatment. Chemotherapy given today per orders. Patient tolerated it well without problems. Vitals stable and discharged home from clinic ambulatory.follow up as scheduled.

## 2017-05-08 ENCOUNTER — Encounter (HOSPITAL_BASED_OUTPATIENT_CLINIC_OR_DEPARTMENT_OTHER): Payer: BLUE CROSS/BLUE SHIELD

## 2017-05-08 ENCOUNTER — Encounter (HOSPITAL_COMMUNITY): Payer: Self-pay

## 2017-05-08 VITALS — BP 117/64 | HR 82 | Temp 98.6°F | Resp 18 | Wt 216.0 lb

## 2017-05-08 DIAGNOSIS — Z5111 Encounter for antineoplastic chemotherapy: Secondary | ICD-10-CM | POA: Diagnosis not present

## 2017-05-08 DIAGNOSIS — C50512 Malignant neoplasm of lower-outer quadrant of left female breast: Secondary | ICD-10-CM | POA: Diagnosis not present

## 2017-05-08 DIAGNOSIS — Z171 Estrogen receptor negative status [ER-]: Principal | ICD-10-CM

## 2017-05-08 LAB — COMPREHENSIVE METABOLIC PANEL
ALK PHOS: 82 U/L (ref 38–126)
ALT: 26 U/L (ref 14–54)
ANION GAP: 7 (ref 5–15)
AST: 22 U/L (ref 15–41)
Albumin: 3.8 g/dL (ref 3.5–5.0)
BILIRUBIN TOTAL: 0.7 mg/dL (ref 0.3–1.2)
BUN: 11 mg/dL (ref 6–20)
CALCIUM: 9.1 mg/dL (ref 8.9–10.3)
CO2: 25 mmol/L (ref 22–32)
Chloride: 107 mmol/L (ref 101–111)
Creatinine, Ser: 0.73 mg/dL (ref 0.44–1.00)
GFR calc non Af Amer: >60 mL/min (ref >60)
Glucose, Bld: 111 mg/dL — ABNORMAL HIGH (ref 65–99)
Potassium: 3.7 mmol/L (ref 3.5–5.1)
SODIUM: 139 mmol/L (ref 135–145)
TOTAL PROTEIN: 6.6 g/dL (ref 6.5–8.1)

## 2017-05-08 LAB — CBC WITH DIFFERENTIAL/PLATELET
Basophils Absolute: 0 10*3/uL (ref 0.0–0.1)
Basophils Relative: 0 %
EOS ABS: 0.1 10*3/uL (ref 0.0–0.7)
Eosinophils Relative: 1 %
HEMATOCRIT: 29.6 % — AB (ref 36.0–46.0)
HEMOGLOBIN: 9.7 g/dL — AB (ref 12.0–15.0)
Lymphocytes Relative: 18 %
Lymphs Abs: 1.5 10*3/uL (ref 0.7–4.0)
MCH: 33.3 pg (ref 26.0–34.0)
MCHC: 32.8 g/dL (ref 30.0–36.0)
MCV: 101.7 fL — ABNORMAL HIGH (ref 78.0–100.0)
MONO ABS: 0.9 10*3/uL (ref 0.1–1.0)
Monocytes Relative: 10 %
NEUTROS PCT: 71 %
Neutro Abs: 5.7 10*3/uL (ref 1.7–7.7)
Platelets: 324 10*3/uL (ref 150–400)
RBC: 2.91 MIL/uL — ABNORMAL LOW (ref 3.87–5.11)
RDW: 20.6 % — ABNORMAL HIGH (ref 11.5–15.5)
WBC: 8.1 10*3/uL (ref 4.0–10.5)

## 2017-05-08 MED ORDER — FAMOTIDINE IN NACL 20-0.9 MG/50ML-% IV SOLN
INTRAVENOUS | Status: AC
  Filled 2017-05-08: qty 50

## 2017-05-08 MED ORDER — DIPHENHYDRAMINE HCL 50 MG/ML IJ SOLN
50.0000 mg | Freq: Once | INTRAMUSCULAR | Status: AC
  Administered 2017-05-08: 25 mg via INTRAVENOUS

## 2017-05-08 MED ORDER — PROMETHAZINE HCL 25 MG PO TABS: 25.0000 mg | ORAL_TABLET | Freq: Four times a day (QID) | ORAL | 1 refills | Status: DC | PRN

## 2017-05-08 MED ORDER — DIPHENHYDRAMINE HCL 50 MG/ML IJ SOLN
INTRAMUSCULAR | Status: AC
  Filled 2017-05-08: qty 1

## 2017-05-08 MED ORDER — AMOXICILLIN-POT CLAVULANATE 875-125 MG PO TABS: 1.0000 | ORAL_TABLET | Freq: Two times a day (BID) | ORAL | 0 refills | Status: AC

## 2017-05-08 MED ORDER — SODIUM CHLORIDE 0.9 % IV SOLN
Freq: Once | INTRAVENOUS | Status: AC
  Administered 2017-05-08: 13:00:00 via INTRAVENOUS

## 2017-05-08 MED ORDER — SODIUM CHLORIDE 0.9% FLUSH
10.0000 mL | INTRAVENOUS | Status: DC | PRN
  Administered 2017-05-08: 10 mL
  Filled 2017-05-08: qty 10

## 2017-05-08 MED ORDER — PACLITAXEL CHEMO INJECTION 300 MG/50ML
80.0000 mg/m2 | Freq: Once | INTRAVENOUS | Status: AC
  Administered 2017-05-08: 168 mg via INTRAVENOUS
  Filled 2017-05-08: qty 28

## 2017-05-08 MED ORDER — LORATADINE 10 MG PO CAPS: 10.0000 mg | ORAL_CAPSULE | Freq: Every day | ORAL | 2 refills | Status: DC

## 2017-05-08 MED ORDER — FAMOTIDINE IN NACL 20-0.9 MG/50ML-% IV SOLN
20.0000 mg | Freq: Once | INTRAVENOUS | Status: AC
  Administered 2017-05-08: 20 mg via INTRAVENOUS

## 2017-05-08 MED ORDER — HEPARIN SOD (PORK) LOCK FLUSH 100 UNIT/ML IV SOLN
500.0000 [IU] | Freq: Once | INTRAVENOUS | Status: AC | PRN
  Administered 2017-05-08: 500 [IU]

## 2017-05-08 NOTE — Progress Notes (Signed)
Labs reviewed with MD, proceed with treatment.  Chemotherapy given today per orders. Patient tolerated it well without problems. Vitals stable and discharged home from clinic ambulatory. Follow up as scheduled. 

## 2017-05-08 NOTE — Patient Instructions (Signed)
[facility] Discharge Instructions for Patients Receiving Chemotherapy   Beginning January 23rd 2017 lab work for the Cancer Center will be done in the  Main lab at [place] on 1st floor. If you have a lab appointment with the Cancer Center please come in thru the  Main Entrance and check in at the main information desk   Today you received the following chemotherapy agents   To help prevent nausea and vomiting after your treatment, we encourage you to take your nausea medication     If you develop nausea and vomiting, or diarrhea that is not controlled by your medication, call the clinic.  The clinic phone number is [phone]. Office hours are Monday-Friday 8:30am-5:00pm.  BELOW ARE SYMPTOMS THAT SHOULD BE REPORTED IMMEDIATELY:  *FEVER GREATER THAN 101.0 F  *CHILLS WITH OR WITHOUT FEVER  NAUSEA AND VOMITING THAT IS NOT CONTROLLED WITH YOUR NAUSEA MEDICATION  *UNUSUAL SHORTNESS OF BREATH  *UNUSUAL BRUISING OR BLEEDING  TENDERNESS IN MOUTH AND THROAT WITH OR WITHOUT PRESENCE OF ULCERS  *URINARY PROBLEMS  *BOWEL PROBLEMS  UNUSUAL RASH Items with * indicate a potential emergency and should be followed up as soon as possible. If you have an emergency after office hours please contact your primary care physician or go to the nearest emergency department.  Please call the clinic during office hours if you have any questions or concerns.   You may also contact the Patient Navigator at [phone] should you have any questions or need assistance in obtaining follow up care.      Resources For Cancer Patients and their Caregivers ? American Cancer Society: Can assist with transportation, wigs, general needs, runs Look Good Feel Better.        1-888-227-6333 ? Cancer Care: Provides financial assistance, online support groups, medication/co-pay assistance.  1-800-813-HOPE (4673) ? [facility] Assists [place] cancer  patients and their families through emotional , educational and financial support.  336-427-4357 ? [place] DSS Where to apply for food stamps, Medicaid and utility assistance. 336-342-1394 ? [facility]: Transportation to medical appointments. 336-347-2287 ? Social Security Administration: May apply for disability if have a Stage IV cancer. 336-342-7796 1-800-772-1213 ? [place] Aging, Disability and Transit Services: Assists with nutrition, care and transit needs. 336-349-2343         

## 2017-05-13 ENCOUNTER — Telehealth (HOSPITAL_COMMUNITY): Payer: Self-pay | Admitting: Oncology

## 2017-05-13 NOTE — Telephone Encounter (Signed)
FAXED [place] [phone]

## 2017-05-15 ENCOUNTER — Encounter (HOSPITAL_BASED_OUTPATIENT_CLINIC_OR_DEPARTMENT_OTHER): Payer: BLUE CROSS/BLUE SHIELD

## 2017-05-15 ENCOUNTER — Encounter (HOSPITAL_BASED_OUTPATIENT_CLINIC_OR_DEPARTMENT_OTHER): Payer: BLUE CROSS/BLUE SHIELD | Admitting: Adult Health

## 2017-05-15 ENCOUNTER — Encounter (HOSPITAL_COMMUNITY): Payer: Self-pay | Admitting: Adult Health

## 2017-05-15 VITALS — BP 120/66 | HR 81 | Temp 98.9°F | Resp 18

## 2017-05-15 VITALS — BP 125/58 | HR 86 | Resp 16 | Ht 65.0 in | Wt 215.0 lb

## 2017-05-15 DIAGNOSIS — C50512 Malignant neoplasm of lower-outer quadrant of left female breast: Secondary | ICD-10-CM | POA: Diagnosis not present

## 2017-05-15 DIAGNOSIS — D6481 Anemia due to antineoplastic chemotherapy: Secondary | ICD-10-CM

## 2017-05-15 DIAGNOSIS — Z5111 Encounter for antineoplastic chemotherapy: Secondary | ICD-10-CM

## 2017-05-15 DIAGNOSIS — Z171 Estrogen receptor negative status [ER-]: Secondary | ICD-10-CM | POA: Diagnosis not present

## 2017-05-15 DIAGNOSIS — G620 Drug-induced polyneuropathy: Secondary | ICD-10-CM

## 2017-05-15 LAB — COMPREHENSIVE METABOLIC PANEL
ALK PHOS: 77 U/L (ref 38–126)
ALT: 29 U/L (ref 14–54)
AST: 27 U/L (ref 15–41)
Albumin: 3.7 g/dL (ref 3.5–5.0)
Anion gap: 6 (ref 5–15)
BUN: 13 mg/dL (ref 6–20)
CHLORIDE: 106 mmol/L (ref 101–111)
CO2: 25 mmol/L (ref 22–32)
Calcium: 8.9 mg/dL (ref 8.9–10.3)
Creatinine, Ser: 0.64 mg/dL (ref 0.44–1.00)
GFR calc Af Amer: >60 mL/min (ref >60)
Glucose, Bld: 126 mg/dL — ABNORMAL HIGH (ref 65–99)
Potassium: 3.5 mmol/L (ref 3.5–5.1)
SODIUM: 137 mmol/L (ref 135–145)
Total Bilirubin: 0.6 mg/dL (ref 0.3–1.2)
Total Protein: 6.7 g/dL (ref 6.5–8.1)

## 2017-05-15 LAB — CBC WITH DIFFERENTIAL/PLATELET
BASOS ABS: 0 10*3/uL (ref 0.0–0.1)
Basophils Relative: 0 %
Eosinophils Absolute: 0.1 10*3/uL (ref 0.0–0.7)
Eosinophils Relative: 1 %
HCT: 30.1 % — ABNORMAL LOW (ref 36.0–46.0)
HEMOGLOBIN: 10 g/dL — AB (ref 12.0–15.0)
LYMPHS PCT: 21 %
Lymphs Abs: 1.2 10*3/uL (ref 0.7–4.0)
MCH: 33.8 pg (ref 26.0–34.0)
MCHC: 33.2 g/dL (ref 30.0–36.0)
MCV: 101.7 fL — AB (ref 78.0–100.0)
Monocytes Absolute: 0.5 10*3/uL (ref 0.1–1.0)
Monocytes Relative: 8 %
NEUTROS PCT: 70 %
Neutro Abs: 4.1 10*3/uL (ref 1.7–7.7)
PLATELETS: 338 10*3/uL (ref 150–400)
RBC: 2.96 MIL/uL — AB (ref 3.87–5.11)
RDW: 19.2 % — ABNORMAL HIGH (ref 11.5–15.5)
WBC: 5.9 10*3/uL (ref 4.0–10.5)

## 2017-05-15 MED ORDER — SODIUM CHLORIDE 0.9 % IV SOLN
Freq: Once | INTRAVENOUS | Status: AC
  Administered 2017-05-15: 11:00:00 via INTRAVENOUS

## 2017-05-15 MED ORDER — PACLITAXEL CHEMO INJECTION 300 MG/50ML
80.0000 mg/m2 | Freq: Once | INTRAVENOUS | Status: AC
  Administered 2017-05-15: 168 mg via INTRAVENOUS
  Filled 2017-05-15: qty 28

## 2017-05-15 MED ORDER — FAMOTIDINE IN NACL 20-0.9 MG/50ML-% IV SOLN
20.0000 mg | Freq: Once | INTRAVENOUS | Status: AC
  Administered 2017-05-15: 20 mg via INTRAVENOUS

## 2017-05-15 MED ORDER — SODIUM CHLORIDE 0.9% FLUSH
10.0000 mL | INTRAVENOUS | Status: DC | PRN
  Administered 2017-05-15: 10 mL
  Filled 2017-05-15: qty 10

## 2017-05-15 MED ORDER — DIPHENHYDRAMINE HCL 50 MG/ML IJ SOLN
50.0000 mg | Freq: Once | INTRAMUSCULAR | Status: AC
  Administered 2017-05-15: 25 mg via INTRAVENOUS

## 2017-05-15 MED ORDER — HEPARIN SOD (PORK) LOCK FLUSH 100 UNIT/ML IV SOLN
500.0000 [IU] | Freq: Once | INTRAVENOUS | Status: AC | PRN
  Administered 2017-05-15: 500 [IU]

## 2017-05-15 MED ORDER — FAMOTIDINE IN NACL 20-0.9 MG/50ML-% IV SOLN
INTRAVENOUS | Status: AC
  Filled 2017-05-15: qty 50

## 2017-05-15 MED ORDER — DIPHENHYDRAMINE HCL 50 MG/ML IJ SOLN
INTRAMUSCULAR | Status: AC
  Filled 2017-05-15: qty 1

## 2017-05-15 NOTE — Progress Notes (Signed)
[place], [place] [address]   CLINIC:  Medical Oncology/Hematology  PCP:  [name], MD 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    BRIEF ONCOLOGIC HISTORY:    Breast cancer of lower-outer quadrant of left female breast ([facility])   11/19/2016 Mammogram    In the lateral aspect of the left breast, posterior depth, there is an irregular spiculated mass measuring approximately 1.8 cm. There are a few pleomorphic calcifications in a linear distribution within the mass and extending slightly anterior to the mass. All together the mass and calcifications span approximately 2.5 cm.      11/21/2016 Procedure    Breast, left, needle core biopsy, 3:30 o'clock, 8 cm fn      11/22/2016 Pathology Results    Breast, left, needle core biopsy, 3:30 o'clock, 8 cm fn - INVASIVE DUCTAL CARCINOMA, GRADE 2.      01/03/2017 Initial Diagnosis    Breast cancer of lower-outer quadrant of left female breast ([facility])     01/22/2017 Procedure    Left breast lumpectomy and left axillary sentinel node biopsy by Dr. Ninfa Linden      01/22/2017 Procedure    Port placed by Dr. Ninfa Linden      01/23/2017 Pathology Results    1. Breast, lumpectomy, Left - INVASIVE GRADE 3 DUCTAL CARCINOMA, SPANNING 2.2 CM IN GREATEST DIMENSION. 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 - LYMPH/VASCULAR INVASION IS PRESENT. - ANTERIOR / MEDIAL MARGIN IS FOCALLY POSITIVE FOR INVASIVE DUCTAL CARCINOMA AND ANTERIOR MARGIN IS BROADLY POSITIVE FOR INVASIVE DUCTAL CARCINOMA. - DUCTAL CARCINOMA IN SITU IS FOCALLY LESS THAN 0.1 CM TO POSTERIOR MARGIN. - OTHER MARGINS ARE NEGATIVE. - SEE ONCOLOGY TEMPLATE. 2. Lymph node, sentinel, biopsy, Left axillary - ONE  BENIGN LYMPH NODE WITH NO TUMOR SEEN (0/1). 3. Lymph node, sentinel, biopsy, Left axillary - ONE LYMPH NODE POSITIVE FOR METASTATIC DUCTAL CARCINOMA (1/1).      01/30/2017 Procedure    Breast, excision, Left, new anterior and medial margins and Lymph nodes, regional resection, Left axillary contents      01/31/2017 Pathology Results    Diagnosis 1. Breast, excision, Left, new anterior and medial margins - RESIDUAL INVASIVE DUCTAL CARCINOMA AND EXTENSIVE RESIDUAL DUCTAL CARCINOMA IN SITU. - INVASIVE DUCTAL CARCINOMA IS FOCALLY 0.1 TO 0.2 CM AWAY FROM ANTERIOR MARGIN BUT INKED MARGINAL SURFACE IS NEGATIVE. - DUCTAL CARCINOMA IN SITU IS FOCALLY LESS THAN 0.1 CM AWAY FROM ANTERIOR MARGIN AND FOCALLY LESS THAN 0.1 CM AWAY FROM MEDIAL ASPECT OF ANTERIOR MARGIN. - SEE COMMENT. 2. Lymph nodes, regional resection, Left axillary contents - SIX BENIGN LYMPH NODES WITH NO TUMOR SEEN (0/6).      02/10/2017 Imaging    MUGA- LEFT ventricular ejection fraction of 54%.  No focal wall motion abnormalities.       02/12/2017 Imaging    CT CAP- 1. Postoperative changes of recent left lumpectomy and left axillary lymph node dissection, without evidence of metastatic disease. 2. Aortic atherosclerosis (ICD10-170.0). Coronary artery calcification. 3. Liver appears fatty.      02/13/2017 -  Chemotherapy    The patient had DOXOrubicin (ADRIAMYCIN) chemo injection 126 mg, 60 mg/m2 = 126 mg, Intravenous,  Once, 1 of 4 cycles  palonosetron (ALOXI) injection 0.25 mg, 0.25 mg, Intravenous,  Once, 1 of 4 cycles  pegfilgrastim (NEULASTA ONPRO KIT) injection 6 mg, 6 mg, Subcutaneous, Once, 1 of 5 cycles  cyclophosphamide (CYTOXAN) 1,260 mg in sodium chloride 0.9 % 250 mL chemo infusion, 600 mg/m2 = 1,260 mg, Intravenous,  Once, 1 of 4 cycles  PACLitaxel (TAXOL) 168 mg in dextrose 5 % 250 mL chemo infusion ( fosaprepitant (EMEND) 150 mg, dexamethasone (DECADRON) 12 mg in sodium chloride 0.9 % 145 mL IVPB, ,  Intravenous,  Once, 1 of 4 cycles  for chemotherapy treatment.          HISTORY OF PRESENT ILLNESS:  (From Kirby Crigler, PA-C's last note on 03/27/17)     INTERVAL HISTORY:  Ms. Wagoner 51 y.o. female returns for follow-up and consideration for next cycle of chemotherapy.   She is due for cycle #6 Taxol today. Endorses some intermittent peripheral neuropathy to her hands and feet; symptoms are largely the same and unchanged from previous; ADLs are not impacted. She is able to perform fine motor activities with her hands (i.e. Clasping necklace, signing her name, etc).   Endorses diarrhea since last cycle of chemotherapy. She thinks this may be d/t Augmentin antibiotics; she will take her last pill today. Endorses some hand and feet swelling, which are largely stable. She continues to be fatigued, but remains active. Reports "some pulling" in her left forearm while doing her arm stretches. Discomfort is improving with stretching.   She has a broken tooth and is concerned that it may cause ulceration to her tongue. She works as a Art therapist and would like to know if it would be safe for her to have the tooth shaved down until she completes chemotherapy and can have invasive procedure.     REVIEW OF SYSTEMS:  Review of Systems  Constitutional: Positive for fatigue. Negative for chills and fever.  HENT:   Negative for lump/mass and nosebleeds.        (R) lateral tongue soreness at times; "my broken tooth is rubbing my tongue."   Eyes: Negative.   Respiratory: Negative.  Negative for cough and shortness of breath.   Cardiovascular: Positive for leg swelling. Negative for chest pain.  Gastrointestinal: Positive for diarrhea. Negative for abdominal pain, blood in stool, constipation, nausea and vomiting.  Endocrine: Negative.   Genitourinary: Negative.  Negative for dysuria and hematuria.   Musculoskeletal: Negative.  Negative for arthralgias.  Skin: Negative.  Negative for rash.    Neurological: Positive for numbness. Negative for dizziness and headaches.  Hematological: Negative.  Negative for adenopathy. Does not bruise/bleed easily.  Psychiatric/Behavioral: Negative.  Negative for depression and sleep disturbance. The patient is not nervous/anxious.      PAST MEDICAL/SURGICAL HISTORY:  Past Medical History:  Diagnosis Date  . Breast cancer of lower-outer quadrant of left female breast ([facility]) 01/03/2017  . Cancer ([facility]) 11/22/2016   Breast   . Family history of breast cancer   . GERD (gastroesophageal reflux disease)    Past Surgical History:  Procedure Laterality Date  . BREAST LUMPECTOMY WITH AXILLARY LYMPH NODE DISSECTION Left 01/30/2017   Procedure: RE-EXCISION OF LEFT BREAST LUMPECTOMY AND LEFT AXILLARY LYMPH NODE DISSECTION;  Surgeon: Coralie Keens, MD;  Location: [facility];  Service: General;  Laterality: Left;  . BREAST LUMPECTOMY WITH RADIOACTIVE SEED AND SENTINEL LYMPH NODE BIOPSY Left 01/22/2017   Procedure: BREAST LUMPECTOMY WITH RADIOACTIVE SEED AND SENTINEL LYMPH NODE BIOPSY;  Surgeon: Coralie Keens, MD;  Location: [facility];  Service: General;  Laterality:  Left;  . COLONOSCOPY N/A 10/26/2015   Procedure: COLONOSCOPY;  Surgeon: Rogene Houston, MD;  [facility];  Service: Endoscopy;  Laterality: N/A;  . DILATION AND CURETTAGE OF UTERUS    . ESOPHAGOGASTRODUODENOSCOPY N/A 10/26/2015   Procedure: ESOPHAGOGASTRODUODENOSCOPY (EGD);  Surgeon: Rogene Houston, MD;  [facility];  Service: Endoscopy;  Laterality: N/A;  1:00  . PORTACATH PLACEMENT Right 01/22/2017   Procedure: INSERTION PORT-A-CATH;  Surgeon: Coralie Keens, MD;  Location: [facility];  Service: General;  Laterality: Right;  . URETHRAL DILATION       SOCIAL HISTORY:  Social History   Social History  . Marital status: Married    Spouse name: [name]  . Number of children: 3  . Years of education: N/A    Occupational History  . Not on file.   Social History Main Topics  . Smoking status: Former Smoker    Quit date: 07/23/2008  . Smokeless tobacco: Never Used     Comment: quit smoking 07/23/2008 after smoking 30 yrs. (1/2 pack a day)  . Alcohol use 0.0 oz/week     Comment: rare  . Drug use: No  . Sexual activity: Yes   Other Topics Concern  . Not on file   Social History Narrative  . No narrative on file    FAMILY HISTORY:  Family History  Problem Relation Age of Onset  . Hypertension Mother   . Hyperlipidemia Mother   . Heart disease Father   . Hypertension Father   . Diabetes Father   . Aneurysm Maternal Grandmother   . Stroke Maternal Grandfather   . Heart disease Paternal Grandfather   . Breast cancer Other        PGF's sister  . Breast cancer Other        PGF's sister  . Breast cancer Other        PGF's aunt (great, great aunt)  . Breast cancer Other        PGM's mother    CURRENT MEDICATIONS:  Outpatient Encounter Prescriptions as of 05/15/2017  Medication Sig  . acetaminophen (TYLENOL) 500 MG tablet Take 500 mg by mouth every 6 (six) hours as needed.  Marland Kitchen amoxicillin-clavulanate (AUGMENTIN) 875-125 MG tablet Take 1 tablet by mouth 2 (two) times daily.  . CYCLOPHOSPHAMIDE IV Inject into the vein.  Marland Kitchen lidocaine-prilocaine (EMLA) cream Apply to affected area once  . Loratadine 10 MG CAPS Take 1 capsule (10 mg total) by mouth daily.  . Misc. 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  . pantoprazole (PROTONIX) 40 MG tablet Take 1 tablet (40 mg total) by mouth daily before breakfast.  . Pegfilgrastim (NEULASTA ONPRO [place]) Inject into the skin.  . promethazine (PHENERGAN) 25 MG tablet Take 1 tablet (25 mg total) by mouth every 6 (six) hours as needed for nausea or vomiting.   No facility-administered encounter medications on file as of 05/15/2017.     ALLERGIES:  Allergies  Allergen Reactions  . Asa  [Aspirin]     Ate a lot of baby aspirin when she was small, mom was told she was allergic, not sure the reaction  . Prednisone     Numbness in face and arms  . Sulfa Antibiotics      PHYSICAL EXAM:  ECOG Performance status: 0-1 - Symptomatic, but independent.      Physical Exam  Constitutional: She is oriented to person, place, and time and well-developed, well-nourished,  and in no distress.  Seen lying in chemo bed in infusion area.   HENT:  Head: Normocephalic.  Mouth/Throat: Oropharynx is clear and moist. No oropharyngeal exudate.  Slight erythema noted to (R) lateral tongue; no obvious ulceration/open sore.   Eyes: Conjunctivae are normal. Pupils are equal, round, and reactive to light. No scleral icterus.  Neck: Normal range of motion. Neck supple.  Cardiovascular: Normal rate, regular rhythm and normal heart sounds.   Pulmonary/Chest: Effort normal and breath sounds normal. No respiratory distress. She has no wheezes. She has no rales.  Abdominal: Soft. Bowel sounds are normal. There is no tenderness. There is no rebound and no guarding.  Musculoskeletal: Normal range of motion. She exhibits no edema.  Lymphadenopathy:    She has no cervical adenopathy.       Right: No supraclavicular adenopathy present.       Left: No supraclavicular adenopathy present.  Neurological: She is alert and oriented to person, place, and time. No cranial nerve deficit. Gait normal.  Skin: Skin is warm and dry. No rash noted.  Psychiatric: Mood, memory, affect and judgment normal.  Nursing note and vitals reviewed.    LABORATORY DATA:  I have reviewed the labs as listed.  CBC    Component Value Date/Time   WBC 8.1 05/08/2017 1124   RBC 2.91 (L) 05/08/2017 1124   HGB 9.7 (L) 05/08/2017 1124   HCT 29.6 (L) 05/08/2017 1124   PLT 324 05/08/2017 1124   MCV 101.7 (H) 05/08/2017 1124   MCV 90.2 07/02/2013 1523   MCH 33.3 05/08/2017 1124   MCHC 32.8 05/08/2017 1124   RDW 20.6 (H)  05/08/2017 1124   LYMPHSABS 1.5 05/08/2017 1124   MONOABS 0.9 05/08/2017 1124   EOSABS 0.1 05/08/2017 1124   BASOSABS 0.0 05/08/2017 1124   CMP Latest Ref Rng & Units 05/08/2017 05/01/2017 04/17/2017  Glucose 65 - 99 mg/dL 111(H) 106(H) 113(H)  BUN 6 - 20 mg/dL _0 Creatinine 0.44 - 1.00 mg/dL 0.73 0.70 0.71  Sodium 135 - 145 mmol/L 139 138 137  Potassium 3.5 - 5.1 mmol/L 3.7 3.9 3.8  Chloride 101 - 111 mmol/L 107 105 105  CO2 22 - 32 mmol/L _1 Calcium 8.9 - 10.3 mg/dL 9.1 9.4 9.2  Total Protein 6.5 - 8.1 g/dL 6.6 7.1 6.8  Total Bilirubin 0.3 - 1.2 mg/dL 0.7 0.9 0.4  Alkaline Phos 38 - 126 U/L 82 84 89  AST 15 - 41 U/L 22 30 32  ALT 14 - 54 U/L 26 36 46    PENDING LABS:    DIAGNOSTIC IMAGING:  *Images and reports reviewed as listed and agree with radiologic findings.  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 Ninfa Linden.  Initiated adjuvant chemotherapy with Adriamycin/Cytoxan x 4 cycles on 02/13/17. Currently undergoing weekly Paclitaxel for 12 planned cycles.  -Due for cycle #6 Taxol today; labs reviewed and are adequate for treatment.  -Reiterated that neuropathy is often the most common dose-limiting side effect of taxane chemotherapy. Current neuropathy symptoms are not severe and we will continue to monitor her symptoms.   -Continue weekly Taxol.  -Return to cancer center for follow-up visit in 2 weeks as we continue to monitor her symptoms.   Anemia:  -Hgb  stable at 10 g/dL.  -Likely secondary to chemotherapy. No transfusion needed.   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  -Grade 1-2 at this time. Does not affect ADLs. No need for dose-reduction at this time.  -Encouraged her to let us know if her neuropathy symptoms worsen in coming  weeks with continued Taxol administration.   (L) arm discomfort: -Possible left forearm strain; improves with exercise.  -Encouraged continued left arm exercises.   Dental concerns:  -It is not unreasonable for Ms. Lavalais to have the cracked tooth shaved down for improved comfort. Discouraged any invasive dental procedures until after she completes chemotherapy and counts are sufficiently recovered.  -If she requires a letter to her dentist, we are happy to provide that for her.   Questions re: neutropenic precautions/diet:  -Encouraged her to continue to avoid large crowds and buffets, when able.  -Her WBCs are well-preserved with weekly Taxol chemotherapy, as expected. I shared with her that in my opinion, it is safe for her to have salads, fresh fruits/vegetables, and steak at this time. There is sufficient evidence suggesting that neutropenic diet is not necessary (i.e. Prevents infections) with most chemotherapy regimens. Encouraged her to wash fresh fruits/vegetables well. I did recommend that she avoid buffets for now, only because of the risk of infection from other patrons at a buffet.      Dispo:  -Continue weekly Taxol.  -Return to cancer center for follow-up visit and consideration for cycle #8 Taxol.    All questions were answered to patient's stated satisfaction. Encouraged patient to call with any new concerns or questions before her next visit to the cancer center and we can certain see her sooner, if needed.    Plan of care discussed with Dr. Talbert Cage, who agrees with the above aforementioned.     Orders placed this encounter:  No orders of the defined types were placed in this encounter.     Mike Craze, NP [facility] [place] [phone]

## 2017-05-15 NOTE — Patient Instructions (Signed)
[place] at [facility] Discharge Instructions  RECOMMENDATIONS MADE BY THE CONSULTANT AND ANY TEST RESULTS WILL BE SENT TO YOUR REFERRING PHYSICIAN.  You were seen today by Mike Craze NP. Continue weekly Taxol. Return in 2 weeks for treatment and follow up.   Thank you for choosing [place] at [facility] to provide your oncology and hematology care.  To afford each patient quality time with our provider, please arrive at least 15 minutes before your scheduled appointment time.    If you have a lab appointment with the [facility] please come in thru the  Main Entrance and check in at the main information desk  You need to re-schedule your appointment should you arrive 10 or more minutes late.  We strive to give you quality time with our providers, and arriving late affects you and other patients whose appointments are after yours.  Also, if you no show three or more times for appointments you may be dismissed from the clinic at the providers discretion.     Again, thank you for choosing [facility].  Our hope is that these requests will decrease the amount of time that you wait before being seen by our physicians.       _____________________________________________________________  Should you have questions after your visit to [facility], please contact our office at [phone] between the hours of 8:30 a.m. and 4:30 p.m.  Voicemails left after 4:30 p.m. will not be returned until the following business day.  For prescription refill requests, have your pharmacy contact our office.       Resources For Cancer Patients and their Caregivers ? American Cancer Society: Can assist with transportation, wigs, general needs, runs Look Good Feel Better.        216-290-2511 ? Cancer Care: Provides financial assistance, online support groups, medication/co-pay assistance.  1-800-813-HOPE 563-278-0754) ? [facility] Assists [place] cancer patients and their families through emotional , educational and financial support.  202-613-7434 ? [place] DSS Where to apply for food stamps, Medicaid and utility assistance. 409 702 5054 ? RCATS: Transportation to medical appointments. 8588129126 ? Social Security Administration: May apply for disability if have a Stage IV cancer. 225-597-1551 463-347-7924 ? [employer], Disability and Transit Services: Assists with nutrition, care and transit needs. [place] Support Programs: @10RELATIVEDAYS @ > Cancer Support Group  2nd Tuesday of the month 1pm-2pm, Journey Room  > Creative Journey  3rd Tuesday of the month 1130am-1pm, Journey Room  > Look Good Feel Better  1st Wednesday of the month 10am-12 noon, Journey Room (Call [place] to register [phone])

## 2017-05-15 NOTE — Progress Notes (Signed)
Labs reviewed prior to administering chemotherapy today. Tolerated it well. No issues. Vitals stable and discharged home from clinic ambulatory. Follow up as scheduled.

## 2017-05-15 NOTE — Patient Instructions (Signed)
[facility] Discharge Instructions for Patients Receiving Chemotherapy   Beginning January 23rd 2017 lab work for the Cancer Center will be done in the  Main lab at [place] on 1st floor. If you have a lab appointment with the Cancer Center please come in thru the  Main Entrance and check in at the main information desk   Today you received the following chemotherapy agents   To help prevent nausea and vomiting after your treatment, we encourage you to take your nausea medication     If you develop nausea and vomiting, or diarrhea that is not controlled by your medication, call the clinic.  The clinic phone number is [phone]. Office hours are Monday-Friday 8:30am-5:00pm.  BELOW ARE SYMPTOMS THAT SHOULD BE REPORTED IMMEDIATELY:  *FEVER GREATER THAN 101.0 F  *CHILLS WITH OR WITHOUT FEVER  NAUSEA AND VOMITING THAT IS NOT CONTROLLED WITH YOUR NAUSEA MEDICATION  *UNUSUAL SHORTNESS OF BREATH  *UNUSUAL BRUISING OR BLEEDING  TENDERNESS IN MOUTH AND THROAT WITH OR WITHOUT PRESENCE OF ULCERS  *URINARY PROBLEMS  *BOWEL PROBLEMS  UNUSUAL RASH Items with * indicate a potential emergency and should be followed up as soon as possible. If you have an emergency after office hours please contact your primary care physician or go to the nearest emergency department.  Please call the clinic during office hours if you have any questions or concerns.   You may also contact the Patient Navigator at [phone] should you have any questions or need assistance in obtaining follow up care.      Resources For Cancer Patients and their Caregivers ? American Cancer Society: Can assist with transportation, wigs, general needs, runs Look Good Feel Better.        1-888-227-6333 ? Cancer Care: Provides financial assistance, online support groups, medication/co-pay assistance.  1-800-813-HOPE (4673) ? [facility] Assists [place] cancer  patients and their families through emotional , educational and financial support.  336-427-4357 ? [place] DSS Where to apply for food stamps, Medicaid and utility assistance. 336-342-1394 ? RCATS: Transportation to medical appointments. 336-347-2287 ? Social Security Administration: May apply for disability if have a Stage IV cancer. 336-342-7796 1-800-772-1213 ? [place] Aging, Disability and Transit Services: Assists with nutrition, care and transit needs. 336-349-2343         

## 2017-05-22 ENCOUNTER — Encounter (HOSPITAL_COMMUNITY): Payer: Self-pay

## 2017-05-22 ENCOUNTER — Encounter (HOSPITAL_COMMUNITY): Payer: BLUE CROSS/BLUE SHIELD | Attending: Hematology & Oncology

## 2017-05-22 VITALS — BP 120/70 | HR 81 | Temp 98.5°F | Resp 18 | Wt 215.4 lb

## 2017-05-22 DIAGNOSIS — Z5111 Encounter for antineoplastic chemotherapy: Secondary | ICD-10-CM

## 2017-05-22 DIAGNOSIS — C50512 Malignant neoplasm of lower-outer quadrant of left female breast: Secondary | ICD-10-CM | POA: Diagnosis present

## 2017-05-22 DIAGNOSIS — Z171 Estrogen receptor negative status [ER-]: Secondary | ICD-10-CM | POA: Diagnosis present

## 2017-05-22 LAB — CBC WITH DIFFERENTIAL/PLATELET
Basophils Absolute: 0 10*3/uL (ref 0.0–0.1)
Basophils Relative: 0 %
EOS ABS: 0.1 10*3/uL (ref 0.0–0.7)
EOS PCT: 2 %
HCT: 31.5 % — ABNORMAL LOW (ref 36.0–46.0)
HEMOGLOBIN: 10.4 g/dL — AB (ref 12.0–15.0)
LYMPHS PCT: 19 %
Lymphs Abs: 1.1 10*3/uL (ref 0.7–4.0)
MCH: 33.8 pg (ref 26.0–34.0)
MCHC: 33 g/dL (ref 30.0–36.0)
MCV: 102.3 fL — ABNORMAL HIGH (ref 78.0–100.0)
Monocytes Absolute: 0.6 10*3/uL (ref 0.1–1.0)
Monocytes Relative: 10 %
NEUTROS PCT: 69 %
Neutro Abs: 3.8 10*3/uL (ref 1.7–7.7)
PLATELETS: 343 10*3/uL (ref 150–400)
RBC: 3.08 MIL/uL — AB (ref 3.87–5.11)
RDW: 18.3 % — ABNORMAL HIGH (ref 11.5–15.5)
WBC: 5.5 10*3/uL (ref 4.0–10.5)

## 2017-05-22 LAB — COMPREHENSIVE METABOLIC PANEL
ALBUMIN: 3.8 g/dL (ref 3.5–5.0)
ALT: 28 U/L (ref 14–54)
AST: 28 U/L (ref 15–41)
Alkaline Phosphatase: 78 U/L (ref 38–126)
Anion gap: 8 (ref 5–15)
BUN: 13 mg/dL (ref 6–20)
CALCIUM: 9.1 mg/dL (ref 8.9–10.3)
CO2: 24 mmol/L (ref 22–32)
Chloride: 105 mmol/L (ref 101–111)
Creatinine, Ser: 0.69 mg/dL (ref 0.44–1.00)
GFR calc Af Amer: >60 mL/min (ref >60)
GFR calc non Af Amer: >60 mL/min (ref >60)
Glucose, Bld: 126 mg/dL — ABNORMAL HIGH (ref 65–99)
Potassium: 3.4 mmol/L — ABNORMAL LOW (ref 3.5–5.1)
SODIUM: 137 mmol/L (ref 135–145)
TOTAL PROTEIN: 6.7 g/dL (ref 6.5–8.1)
Total Bilirubin: 0.9 mg/dL (ref 0.3–1.2)

## 2017-05-22 MED ORDER — SODIUM CHLORIDE 0.9 % IV SOLN
Freq: Once | INTRAVENOUS | Status: AC
  Administered 2017-05-22: 12:00:00 via INTRAVENOUS

## 2017-05-22 MED ORDER — PACLITAXEL CHEMO INJECTION 300 MG/50ML
80.0000 mg/m2 | Freq: Once | INTRAVENOUS | Status: AC
  Administered 2017-05-22: 168 mg via INTRAVENOUS
  Filled 2017-05-22: qty 28

## 2017-05-22 MED ORDER — DIPHENHYDRAMINE HCL 50 MG/ML IJ SOLN
50.0000 mg | Freq: Once | INTRAMUSCULAR | Status: AC
  Administered 2017-05-22: 50 mg via INTRAVENOUS
  Filled 2017-05-22: qty 1

## 2017-05-22 MED ORDER — FAMOTIDINE IN NACL 20-0.9 MG/50ML-% IV SOLN
20.0000 mg | Freq: Once | INTRAVENOUS | Status: AC
  Administered 2017-05-22: 20 mg via INTRAVENOUS
  Filled 2017-05-22: qty 50

## 2017-05-22 MED ORDER — HEPARIN SOD (PORK) LOCK FLUSH 100 UNIT/ML IV SOLN
500.0000 [IU] | Freq: Once | INTRAVENOUS | Status: AC | PRN
  Administered 2017-05-22: 500 [IU]
  Filled 2017-05-22: qty 5

## 2017-05-22 MED ORDER — SODIUM CHLORIDE 0.9% FLUSH
10.0000 mL | INTRAVENOUS | Status: DC | PRN
  Administered 2017-05-22: 10 mL
  Filled 2017-05-22: qty 10

## 2017-05-22 NOTE — Patient Instructions (Signed)
[facility] Discharge Instructions for Patients Receiving Chemotherapy   Beginning January 23rd 2017 lab work for the Cancer Center will be done in the  Main lab at [facility] on 1st floor. If you have a lab appointment with the Cancer Center please come in thru the  Main Entrance and check in at the main information desk   Today you received the following chemotherapy agents Taxol. Follow-up as scheduled. Call clinic for any questions or concerns  To help prevent nausea and vomiting after your treatment, we encourage you to take your nausea medication   If you develop nausea and vomiting, or diarrhea that is not controlled by your medication, call the clinic.  The clinic phone number is [phone]. Office hours are Monday-Friday 8:30am-5:00pm.  BELOW ARE SYMPTOMS THAT SHOULD BE REPORTED IMMEDIATELY:  *FEVER GREATER THAN 101.0 F  *CHILLS WITH OR WITHOUT FEVER  NAUSEA AND VOMITING THAT IS NOT CONTROLLED WITH YOUR NAUSEA MEDICATION  *UNUSUAL SHORTNESS OF BREATH  *UNUSUAL BRUISING OR BLEEDING  TENDERNESS IN MOUTH AND THROAT WITH OR WITHOUT PRESENCE OF ULCERS  *URINARY PROBLEMS  *BOWEL PROBLEMS  UNUSUAL RASH Items with * indicate a potential emergency and should be followed up as soon as possible. If you have an emergency after office hours please contact your primary care physician or go to the nearest emergency department.  Please call the clinic during office hours if you have any questions or concerns.   You may also contact the Patient Navigator at [phone] should you have any questions or need assistance in obtaining follow up care.      Resources For Cancer Patients and their Caregivers ? American Cancer Society: Can assist with transportation, wigs, general needs, runs Look Good Feel Better.        1-888-227-6333 ? Cancer Care: Provides financial assistance, online support groups, medication/co-pay assistance.  1-800-813-HOPE  (4673) ? [facility] Assists [place] cancer patients and their families through emotional , educational and financial support.  336-427-4357 ? [place] DSS Where to apply for food stamps, Medicaid and utility assistance. 336-342-1394 ? RCATS: Transportation to medical appointments. 336-347-2287 ? Social Security Administration: May apply for disability if have a Stage IV cancer. 336-342-7796 1-800-772-1213 ? [place] Aging, Disability and Transit Services: Assists with nutrition, care and transit needs. 336-349-2343         

## 2017-05-22 NOTE — Progress Notes (Signed)
Shannon Logan tolerated chemo tx well without complaints or incident. Labs and pt's continued nasal drainage reviewed with Dr. Talbert Cage prior to administering chemotherapy. Pt to try Sudafed and /or Mucinex as needed for sinus drainage. VSS upon discharge. Pt discharged self ambulatory in satisfactory condition accompanied by her mother

## 2017-05-27 ENCOUNTER — Telehealth (HOSPITAL_COMMUNITY): Payer: Self-pay | Admitting: Oncology

## 2017-05-27 NOTE — Telephone Encounter (Signed)
Faxed cancer policy info

## 2017-05-27 NOTE — Telephone Encounter (Signed)
Pt has a [employer] 1st card for neulasta. Checked for bal and pt does not owe for inj.

## 2017-05-28 ENCOUNTER — Other Ambulatory Visit (HOSPITAL_COMMUNITY): Payer: Self-pay | Admitting: Oncology

## 2017-05-29 ENCOUNTER — Encounter (HOSPITAL_BASED_OUTPATIENT_CLINIC_OR_DEPARTMENT_OTHER): Payer: BLUE CROSS/BLUE SHIELD | Admitting: Oncology

## 2017-05-29 ENCOUNTER — Encounter (HOSPITAL_COMMUNITY): Payer: BLUE CROSS/BLUE SHIELD | Attending: Oncology

## 2017-05-29 ENCOUNTER — Other Ambulatory Visit (HOSPITAL_COMMUNITY): Payer: Self-pay | Admitting: Oncology

## 2017-05-29 ENCOUNTER — Encounter (HOSPITAL_COMMUNITY): Payer: Self-pay | Admitting: Oncology

## 2017-05-29 VITALS — BP 119/69 | HR 81 | Temp 98.2°F | Resp 18 | Wt 216.0 lb

## 2017-05-29 DIAGNOSIS — C773 Secondary and unspecified malignant neoplasm of axilla and upper limb lymph nodes: Secondary | ICD-10-CM

## 2017-05-29 DIAGNOSIS — G620 Drug-induced polyneuropathy: Secondary | ICD-10-CM

## 2017-05-29 DIAGNOSIS — C50512 Malignant neoplasm of lower-outer quadrant of left female breast: Secondary | ICD-10-CM

## 2017-05-29 DIAGNOSIS — Z171 Estrogen receptor negative status [ER-]: Secondary | ICD-10-CM

## 2017-05-29 DIAGNOSIS — Z5111 Encounter for antineoplastic chemotherapy: Secondary | ICD-10-CM | POA: Diagnosis not present

## 2017-05-29 LAB — COMPREHENSIVE METABOLIC PANEL
ALBUMIN: 3.7 g/dL (ref 3.5–5.0)
ALT: 31 U/L (ref 14–54)
ANION GAP: 8 (ref 5–15)
AST: 28 U/L (ref 15–41)
Alkaline Phosphatase: 81 U/L (ref 38–126)
BILIRUBIN TOTAL: 0.8 mg/dL (ref 0.3–1.2)
BUN: 11 mg/dL (ref 6–20)
CO2: 24 mmol/L (ref 22–32)
Calcium: 9 mg/dL (ref 8.9–10.3)
Chloride: 103 mmol/L (ref 101–111)
Creatinine, Ser: 0.61 mg/dL (ref 0.44–1.00)
GFR calc Af Amer: >60 mL/min (ref >60)
GFR calc non Af Amer: >60 mL/min (ref >60)
GLUCOSE: 113 mg/dL — AB (ref 65–99)
POTASSIUM: 3.8 mmol/L (ref 3.5–5.1)
SODIUM: 135 mmol/L (ref 135–145)
TOTAL PROTEIN: 6.5 g/dL (ref 6.5–8.1)

## 2017-05-29 LAB — CBC WITH DIFFERENTIAL/PLATELET
BASOS PCT: 0 %
Basophils Absolute: 0 10*3/uL (ref 0.0–0.1)
EOS ABS: 0.1 10*3/uL (ref 0.0–0.7)
EOS PCT: 1 %
HCT: 32.1 % — ABNORMAL LOW (ref 36.0–46.0)
Hemoglobin: 10.5 g/dL — ABNORMAL LOW (ref 12.0–15.0)
Lymphocytes Relative: 21 %
Lymphs Abs: 1.1 10*3/uL (ref 0.7–4.0)
MCH: 33.9 pg (ref 26.0–34.0)
MCHC: 32.7 g/dL (ref 30.0–36.0)
MCV: 103.5 fL — ABNORMAL HIGH (ref 78.0–100.0)
MONO ABS: 0.5 10*3/uL (ref 0.1–1.0)
MONOS PCT: 9 %
NEUTROS ABS: 3.7 10*3/uL (ref 1.7–7.7)
Neutrophils Relative %: 69 %
Platelets: 317 10*3/uL (ref 150–400)
RBC: 3.1 MIL/uL — ABNORMAL LOW (ref 3.87–5.11)
RDW: 17.3 % — AB (ref 11.5–15.5)
WBC: 5.4 10*3/uL (ref 4.0–10.5)

## 2017-05-29 MED ORDER — FAMOTIDINE IN NACL 20-0.9 MG/50ML-% IV SOLN
INTRAVENOUS | Status: AC
  Filled 2017-05-29: qty 50

## 2017-05-29 MED ORDER — DIPHENHYDRAMINE HCL 50 MG/ML IJ SOLN
INTRAMUSCULAR | Status: AC
  Filled 2017-05-29: qty 1

## 2017-05-29 MED ORDER — SODIUM CHLORIDE 0.9 % IV SOLN
Freq: Once | INTRAVENOUS | Status: AC
  Administered 2017-05-29: 13:00:00 via INTRAVENOUS

## 2017-05-29 MED ORDER — PACLITAXEL CHEMO INJECTION 300 MG/50ML
80.0000 mg/m2 | Freq: Once | INTRAVENOUS | Status: AC
  Administered 2017-05-29: 168 mg via INTRAVENOUS
  Filled 2017-05-29: qty 28

## 2017-05-29 MED ORDER — SODIUM CHLORIDE 0.9% FLUSH
10.0000 mL | INTRAVENOUS | Status: DC | PRN
  Administered 2017-05-29: 10 mL
  Filled 2017-05-29: qty 10

## 2017-05-29 MED ORDER — FAMOTIDINE IN NACL 20-0.9 MG/50ML-% IV SOLN
20.0000 mg | Freq: Once | INTRAVENOUS | Status: AC
  Administered 2017-05-29: 20 mg via INTRAVENOUS

## 2017-05-29 MED ORDER — HEPARIN SOD (PORK) LOCK FLUSH 100 UNIT/ML IV SOLN
500.0000 [IU] | Freq: Once | INTRAVENOUS | Status: AC | PRN
  Administered 2017-05-29: 500 [IU]

## 2017-05-29 MED ORDER — DIPHENHYDRAMINE HCL 50 MG/ML IJ SOLN
25.0000 mg | Freq: Once | INTRAMUSCULAR | Status: AC
  Administered 2017-05-29: 25 mg via INTRAVENOUS

## 2017-05-29 NOTE — Progress Notes (Signed)
Labs reviewed by Kirby Crigler PA-C. Proceed with treatment.  Chemotherapy given today per orders . Patient tolerated it well without problems. Vitals stable and discharged home from clinic ambulatory. Follow up as scheduled.

## 2017-05-29 NOTE — Patient Instructions (Signed)
[facility] Discharge Instructions for Patients Receiving Chemotherapy   Beginning January 23rd 2017 lab work for the Cancer Center will be done in the  Main lab at [place] on 1st floor. If you have a lab appointment with the Cancer Center please come in thru the  Main Entrance and check in at the main information desk   Today you received the following chemotherapy agents   To help prevent nausea and vomiting after your treatment, we encourage you to take your nausea medication     If you develop nausea and vomiting, or diarrhea that is not controlled by your medication, call the clinic.  The clinic phone number is [phone]. Office hours are Monday-Friday 8:30am-5:00pm.  BELOW ARE SYMPTOMS THAT SHOULD BE REPORTED IMMEDIATELY:  *FEVER GREATER THAN 101.0 F  *CHILLS WITH OR WITHOUT FEVER  NAUSEA AND VOMITING THAT IS NOT CONTROLLED WITH YOUR NAUSEA MEDICATION  *UNUSUAL SHORTNESS OF BREATH  *UNUSUAL BRUISING OR BLEEDING  TENDERNESS IN MOUTH AND THROAT WITH OR WITHOUT PRESENCE OF ULCERS  *URINARY PROBLEMS  *BOWEL PROBLEMS  UNUSUAL RASH Items with * indicate a potential emergency and should be followed up as soon as possible. If you have an emergency after office hours please contact your primary care physician or go to the nearest emergency department.  Please call the clinic during office hours if you have any questions or concerns.   You may also contact the Patient Navigator at [phone] should you have any questions or need assistance in obtaining follow up care.      Resources For Cancer Patients and their Caregivers ? American Cancer Society: Can assist with transportation, wigs, general needs, runs Look Good Feel Better.        1-888-227-6333 ? Cancer Care: Provides financial assistance, online support groups, medication/co-pay assistance.  1-800-813-HOPE (4673) ? [facility] Assists [place] cancer  patients and their families through emotional , educational and financial support.  336-427-4357 ? [place] DSS Where to apply for food stamps, Medicaid and utility assistance. 336-342-1394 ? [facility]: Transportation to medical appointments. 336-347-2287 ? Social Security Administration: May apply for disability if have a Stage IV cancer. 336-342-7796 1-800-772-1213 ? [place] Aging, Disability and Transit Services: Assists with nutrition, care and transit needs. [phone]         

## 2017-05-29 NOTE — Patient Instructions (Addendum)
[place] at [facility] Discharge Instructions  RECOMMENDATIONS MADE BY THE CONSULTANT AND ANY TEST RESULTS WILL BE SENT TO YOUR REFERRING PHYSICIAN.  You were seen today by Kirby Crigler PA-C. Treatment today. Treatment weekly. Return in 2 weeks for follow up.    Thank you for choosing [place] at [facility] to provide your oncology and hematology care.  To afford each patient quality time with our provider, please arrive at least 15 minutes before your scheduled appointment time.    If you have a lab appointment with the [facility] please come in thru the  Main Entrance and check in at the main information desk  You need to re-schedule your appointment should you arrive 10 or more minutes late.  We strive to give you quality time with our providers, and arriving late affects you and other patients whose appointments are after yours.  Also, if you no show three or more times for appointments you may be dismissed from the clinic at the providers discretion.     Again, thank you for choosing [facility].  Our hope is that these requests will decrease the amount of time that you wait before being seen by our physicians.       _____________________________________________________________  Should you have questions after your visit to [facility], please contact our office at [phone] between the hours of 8:30 a.m. and 4:30 p.m.  Voicemails left after 4:30 p.m. will not be returned until the following business day.  For prescription refill requests, have your pharmacy contact our office.       Resources For Cancer Patients and their Caregivers ? American Cancer Society: Can assist with transportation, wigs, general needs, runs Look Good Feel Better.        (712)[phone] ? Cancer Care: Provides financial assistance, online support groups, medication/co-pay assistance.  1-800-813-HOPE 301-133-6731) ? [facility] Assists [place] cancer patients and their families through emotional , educational and financial support.  309-688-6635 ? [place] DSS Where to apply for food stamps, Medicaid and utility assistance. 332-266-6490 ? RCATS: Transportation to medical appointments. 332-856-7691 ? Social Security Administration: May apply for disability if have a Stage IV cancer. 3185128229 260-521-6052 ? [employer], Disability and Transit Services: Assists with nutrition, care and transit needs. [place] [facility] Support Programs: @10RELATIVEDAYS @ > Cancer Support Group  2nd Tuesday of the month 1pm-2pm, Journey Room  > Creative Journey  3rd Tuesday of the month 1130am-1pm, Journey Room  > Look Good Feel Better  1st Wednesday of the month 10am-12 noon, Journey Room (Call [place] to register [phone])

## 2017-05-29 NOTE — Progress Notes (Signed)
Shannon Maize, MD Shannon Logan [id number]  Malignant neoplasm of lower-outer quadrant of left breast of female, estrogen receptor negative (Shannon Logan)  CURRENT THERAPY: Weekly Paclitaxel x 12  INTERVAL HISTORY: Shannon Logan 51 y.o. female returns for followup of Stage IIIA (pT2pN1AM0) invasive left breast cancer in the lower-outer quadrant, ER/PR/HER2 NEGATIVE, S/P left breast lumpectomy with positive margins by Dr. Ninfa Logan on 01/22/2017 with one positive left axillary lymph node positive for metastatic disease resulting in a re-excision on 01/30/2017 and left axillary lymph node dissection with invasive component focally 0.1- 0.2 cm away from anterior margin, but inked margin surface is negative and 0/6 lymph nodes.  HPI Elements   Location: Left breast, lower-outer quadrant  Quality: Invasive ductal carcinoma  Severity: Stage IIIA  Duration: Dx in 01/2017  Context: S/P left lumpectomy with re-excision for positive margin and left axillary lymph node dissection.  Timing:   Modifying Factors:   Associated Signs & Symptoms:    Reports intermittent lower extremity edema.  She notes that it is more common when she is out in the yard working.  She has no edema today.  She notes a left upper extremity "tightness."  This occurred a few days ago when she was more active in the yard using her arm.  She notes improvement in improved range of motion since then.  She continues to have epistaxis in the morning.  Her platelet count is within normal limits.  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  She maintains ability to button buttons, tired shoes, and fine motor skills.  She is able to write appropriately.  She notes that the peripheral neuropathy is limited to her fingertips and toe tips.  She denies any difficulty with ambulation.  She denies any weird sensations of her feet while walking.  She denies any falls.  She relays a story in which she  had to manipulate her daughter's small earrings without any difficulty.  Review of Systems  Constitutional: Negative.  Negative for chills, fever and weight loss.  HENT: Positive for nosebleeds.   Eyes: Negative.   Respiratory: Negative.  Negative for cough.   Cardiovascular: Positive for leg swelling. Negative for chest pain.  Gastrointestinal: Negative.  Negative for blood in stool, constipation, diarrhea, melena, nausea and vomiting.  Genitourinary: Negative.   Musculoskeletal: Negative.   Skin: Negative.   Neurological: Positive for sensory change. Negative for weakness.  Endo/Heme/Allergies: Negative.   Psychiatric/Behavioral: Negative.     Past Medical History:  Diagnosis Date  . Breast cancer of lower-outer quadrant of left female breast ([place]) 01/03/2017  . Cancer ([place]) 11/22/2016   Breast   . Family history of breast cancer   . GERD (gastroesophageal reflux disease)     Past Surgical History:  Procedure Laterality Date  . BREAST LUMPECTOMY WITH AXILLARY LYMPH NODE DISSECTION Left 01/30/2017   Procedure: RE-EXCISION OF LEFT BREAST LUMPECTOMY AND LEFT AXILLARY LYMPH NODE DISSECTION;  Surgeon: Shannon Keens, MD;  Location: [place];  Service: General;  Laterality: Left;  . BREAST LUMPECTOMY WITH RADIOACTIVE SEED AND SENTINEL LYMPH NODE BIOPSY Left 01/22/2017   Procedure: BREAST LUMPECTOMY WITH RADIOACTIVE SEED AND SENTINEL LYMPH NODE BIOPSY;  Surgeon: Shannon Keens, MD;  Location: [place];  Service: General;  Laterality: Left;  . COLONOSCOPY N/A 10/26/2015   Procedure: COLONOSCOPY;  Surgeon: Shannon Houston, MD;  [facility];  Service: Endoscopy;  Laterality:  N/A;  . DILATION AND CURETTAGE OF UTERUS    . ESOPHAGOGASTRODUODENOSCOPY N/A 10/26/2015   Procedure: ESOPHAGOGASTRODUODENOSCOPY (EGD);  Surgeon: Shannon Houston, MD;  [facility];  Service: Endoscopy;  Laterality: N/A;  1:00  . PORTACATH PLACEMENT Right  01/22/2017   Procedure: INSERTION PORT-A-CATH;  Surgeon: Shannon Keens, MD;  Location: [place];  Service: General;  Laterality: Right;  . URETHRAL DILATION      Family History  Problem Relation Age of Onset  . Hypertension Mother   . Hyperlipidemia Mother   . Heart disease Father   . Hypertension Father   . Diabetes Father   . Aneurysm Maternal Grandmother   . Stroke Maternal Grandfather   . Heart disease Paternal Grandfather   . Breast cancer Other        PGF's sister  . Breast cancer Other        PGF's sister  . Breast cancer Other        PGF's aunt (great, great aunt)  . Breast cancer Other        PGM's mother    Social History   Social History  . Marital status: Married    Spouse name: Shannon Logan  . Number of children: 3  . Years of education: N/A   Social History Main Topics  . Smoking status: Former Smoker    Quit date: 07/23/2008  . Smokeless tobacco: Never Used     Comment: quit smoking 07/23/2008 after smoking 30 yrs. (1/2 pack a day)  . Alcohol use 0.0 oz/week     Comment: rare  . Drug use: No  . Sexual activity: Yes   Other Topics Concern  . None   Social History Narrative  . None     PHYSICAL EXAMINATION  ECOG PERFORMANCE STATUS: 1 - Symptomatic but completely ambulatory  There were no vitals filed for this visit.  Vitals - 1 value per visit 8/67/6720  SYSTOLIC 947  DIASTOLIC 76  Pulse 92  Temperature 98  Respirations 18  Weight (lb) 216    GENERAL:alert, no distress, well nourished, well developed, comfortable, cooperative, obese, smiling and in chemo-bed, accompanied by aunt and friend. SKIN: skin color, texture, turgor are normal, no rashes or significant lesions HEAD: Normocephalic, No masses, lesions, tenderness or abnormalities EYES: normal, EOMI, Conjunctiva are pink and non-injected EARS: External ears normal OROPHARYNX:lips, buccal mucosa, and tongue normal and mucous membranes are moist  NECK: supple, no adenopathy,  trachea midline LYMPH:  no palpable lymphadenopathy BREAST:not examined LUNGS: clear to auscultation  HEART: regular rate & rhythm, no murmurs and no gallops ABDOMEN:abdomen soft, non-tender, obese and normal bowel sounds BACK: Back symmetric, no curvature., No CVA tenderness EXTREMITIES:less then 2 second capillary refill, no joint deformities, effusion, or inflammation, no edema, no skin discoloration, no cyanosis  NEURO: alert & oriented x 3 with fluent speech, no focal motor/sensory deficits   LABORATORY DATA: CBC    Component Value Date/Time   WBC 5.4 05/29/2017 1139   RBC 3.10 (L) 05/29/2017 1139   HGB 10.5 (L) 05/29/2017 1139   HCT 32.1 (L) 05/29/2017 1139   PLT 317 05/29/2017 1139   MCV 103.5 (H) 05/29/2017 1139   MCV 90.2 07/02/2013 1523   MCH 33.9 05/29/2017 1139   MCHC 32.7 05/29/2017 1139   RDW 17.3 (H) 05/29/2017 1139   LYMPHSABS 1.1 05/29/2017 1139   MONOABS 0.5 05/29/2017 1139   EOSABS 0.1 05/29/2017 1139   BASOSABS 0.0 05/29/2017 1139  Chemistry      Component Value Date/Time   NA 135 05/29/2017 1139   NA 144 10/25/2016 0910   K 3.8 05/29/2017 1139   CL 103 05/29/2017 1139   CO2 24 05/29/2017 1139   BUN 11 05/29/2017 1139   BUN 13 10/25/2016 0910   CREATININE 0.61 05/29/2017 1139   CREATININE 0.78 07/02/2013 1448      Component Value Date/Time   CALCIUM 9.0 05/29/2017 1139   ALKPHOS 81 05/29/2017 1139   AST 28 05/29/2017 1139   ALT 31 05/29/2017 1139   BILITOT 0.8 05/29/2017 1139   BILITOT 0.9 10/25/2016 0910        PENDING LABS:   RADIOGRAPHIC STUDIES:  No results found.   PATHOLOGY:    ASSESSMENT AND PLAN:  Breast cancer of lower-outer quadrant of left female breast ([place]) Stage IIIA (pT2pN1AM0) left breast cancer in the lower-outer quadrant, ER/PR/HER2 NEGATIVE, S/P left lumpectomy with positive margins by Dr. Ninfa Logan on 01/22/2017 with one positive left axillary node with positive for metastatic disease resulting in a  re-excision on 01/30/2017 and left axillary lymph node dissection with invasive component focally 0.1-0.2 cm away from anterior margin but inked margin surface is negative and 0/6 benign lymph nodes.  Pre-treatment labs: CBC diff, CMET.  I personally reviewed and went over laboratory results with the patient.  The results are noted within this dictation.  Labs satisfy treatment parameters.  Nursing, in accordance with chemotherapy administration protocol, will monitor for acute side effects/toxicities associated with chemotherapy administration today.  She reports chemotherapy-induced peripheral neuropathy, it is currently grade 1.  She is provided education regarding lymphedema.  She is educated about peripheral/dependent edema as well.  Return in 1 week for Tx.  Return in 2 weeks for treatment and follow-up.  We will need to plan on referring her to XRT and this can be addressed at follow-up appointment.    ORDERS PLACED FOR THIS ENCOUNTER: No orders of the defined types were placed in this encounter.   MEDICATIONS PRESCRIBED THIS ENCOUNTER: No orders of the defined types were placed in this encounter.   THERAPY PLAN:  Continued with treatment as planned.  All questions were answered. The patient knows to call the clinic with any problems, questions or concerns. We can certainly see the patient much sooner if necessary.  Patient and plan discussed with Dr. Twana First and she is in agreement with the aforementioned.   This note is electronically signed by: Doy Mince 05/29/2017 1:55 PM

## 2017-05-29 NOTE — Assessment & Plan Note (Addendum)
Stage IIIA (pT2pN1AM0) left breast cancer in the lower-outer quadrant, ER/PR/HER2 NEGATIVE, S/P left lumpectomy with positive margins by Dr. Ninfa Linden on 01/22/2017 with one positive left axillary node with positive for metastatic disease resulting in a re-excision on 01/30/2017 and left axillary lymph node dissection with invasive component focally 0.1-0.2 cm away from anterior margin but inked margin surface is negative and 0/6 benign lymph nodes.  Pre-treatment labs: CBC diff, CMET.  I personally reviewed and went over laboratory results with the patient.  The results are noted within this dictation.  Labs satisfy treatment parameters.  Nursing, in accordance with chemotherapy administration protocol, will monitor for acute side effects/toxicities associated with chemotherapy administration today.  She reports chemotherapy-induced peripheral neuropathy, it is currently grade 1.  She is provided education regarding lymphedema.  She is educated about peripheral/dependent edema as well.  Return in 1 week for Tx.  Return in 2 weeks for treatment and follow-up.  We will need to plan on referring her to XRT and this can be addressed at follow-up appointment.

## 2017-06-05 ENCOUNTER — Encounter (HOSPITAL_COMMUNITY): Payer: Self-pay

## 2017-06-05 ENCOUNTER — Encounter (HOSPITAL_BASED_OUTPATIENT_CLINIC_OR_DEPARTMENT_OTHER): Payer: BLUE CROSS/BLUE SHIELD

## 2017-06-05 VITALS — BP 127/69 | HR 79 | Temp 98.1°F | Resp 18 | Wt 220.1 lb

## 2017-06-05 DIAGNOSIS — Z171 Estrogen receptor negative status [ER-]: Principal | ICD-10-CM

## 2017-06-05 DIAGNOSIS — C50512 Malignant neoplasm of lower-outer quadrant of left female breast: Secondary | ICD-10-CM

## 2017-06-05 DIAGNOSIS — Z5111 Encounter for antineoplastic chemotherapy: Secondary | ICD-10-CM

## 2017-06-05 LAB — COMPREHENSIVE METABOLIC PANEL
ALK PHOS: 72 U/L (ref 38–126)
ALT: 24 U/L (ref 14–54)
AST: 25 U/L (ref 15–41)
Albumin: 3.4 g/dL — ABNORMAL LOW (ref 3.5–5.0)
Anion gap: 8 (ref 5–15)
BILIRUBIN TOTAL: 0.6 mg/dL (ref 0.3–1.2)
BUN: 9 mg/dL (ref 6–20)
CALCIUM: 8.6 mg/dL — AB (ref 8.9–10.3)
CO2: 23 mmol/L (ref 22–32)
CREATININE: 0.65 mg/dL (ref 0.44–1.00)
Chloride: 108 mmol/L (ref 101–111)
Glucose, Bld: 123 mg/dL — ABNORMAL HIGH (ref 65–99)
Potassium: 3.9 mmol/L (ref 3.5–5.1)
Sodium: 139 mmol/L (ref 135–145)
TOTAL PROTEIN: 6 g/dL — AB (ref 6.5–8.1)

## 2017-06-05 LAB — CBC WITH DIFFERENTIAL/PLATELET
BASOS ABS: 0 10*3/uL (ref 0.0–0.1)
BASOS PCT: 0 %
EOS ABS: 0.1 10*3/uL (ref 0.0–0.7)
Eosinophils Relative: 1 %
HCT: 30.8 % — ABNORMAL LOW (ref 36.0–46.0)
HEMOGLOBIN: 10.1 g/dL — AB (ref 12.0–15.0)
LYMPHS ABS: 0.9 10*3/uL (ref 0.7–4.0)
Lymphocytes Relative: 19 %
MCH: 34.4 pg — ABNORMAL HIGH (ref 26.0–34.0)
MCHC: 32.8 g/dL (ref 30.0–36.0)
MCV: 104.8 fL — ABNORMAL HIGH (ref 78.0–100.0)
Monocytes Absolute: 0.4 10*3/uL (ref 0.1–1.0)
Monocytes Relative: 8 %
NEUTROS PCT: 72 %
Neutro Abs: 3.6 10*3/uL (ref 1.7–7.7)
PLATELETS: 328 10*3/uL (ref 150–400)
RBC: 2.94 MIL/uL — AB (ref 3.87–5.11)
RDW: 16.4 % — ABNORMAL HIGH (ref 11.5–15.5)
WBC: 5 10*3/uL (ref 4.0–10.5)

## 2017-06-05 MED ORDER — SODIUM CHLORIDE 0.9 % IV SOLN
Freq: Once | INTRAVENOUS | Status: AC
  Administered 2017-06-05: 13:00:00 via INTRAVENOUS

## 2017-06-05 MED ORDER — SODIUM CHLORIDE 0.9% FLUSH
10.0000 mL | INTRAVENOUS | Status: DC | PRN
  Administered 2017-06-05: 10 mL
  Filled 2017-06-05: qty 10

## 2017-06-05 MED ORDER — PACLITAXEL CHEMO INJECTION 300 MG/50ML
80.0000 mg/m2 | Freq: Once | INTRAVENOUS | Status: AC
  Administered 2017-06-05: 168 mg via INTRAVENOUS
  Filled 2017-06-05: qty 28

## 2017-06-05 MED ORDER — IBUPROFEN 200 MG PO TABS
400.0000 mg | ORAL_TABLET | Freq: Once | ORAL | Status: AC
  Administered 2017-06-05: 400 mg via ORAL
  Filled 2017-06-05: qty 2

## 2017-06-05 MED ORDER — HEPARIN SOD (PORK) LOCK FLUSH 100 UNIT/ML IV SOLN
500.0000 [IU] | Freq: Once | INTRAVENOUS | Status: AC | PRN
  Administered 2017-06-05: 500 [IU]
  Filled 2017-06-05 (×2): qty 5

## 2017-06-05 MED ORDER — DIPHENHYDRAMINE HCL 50 MG/ML IJ SOLN
25.0000 mg | Freq: Once | INTRAMUSCULAR | Status: AC
  Administered 2017-06-05: 25 mg via INTRAVENOUS

## 2017-06-05 MED ORDER — MISC. DEVICES MISC: 0 refills | Status: DC

## 2017-06-05 MED ORDER — FAMOTIDINE IN NACL 20-0.9 MG/50ML-% IV SOLN
20.0000 mg | Freq: Once | INTRAVENOUS | Status: AC
  Administered 2017-06-05: 20 mg via INTRAVENOUS
  Filled 2017-06-05: qty 50

## 2017-06-05 MED ORDER — DIPHENHYDRAMINE HCL 50 MG/ML IJ SOLN
INTRAMUSCULAR | Status: AC
  Filled 2017-06-05: qty 1

## 2017-06-05 NOTE — Patient Instructions (Signed)
[facility] Discharge Instructions for Patients Receiving Chemotherapy   Beginning January 23rd 2017 lab work for the Cancer Center will be done in the  Main lab at [place] on 1st floor. If you have a lab appointment with the Cancer Center please come in thru the  Main Entrance and check in at the main information desk   Today you received the following chemotherapy agents   To help prevent nausea and vomiting after your treatment, we encourage you to take your nausea medication     If you develop nausea and vomiting, or diarrhea that is not controlled by your medication, call the clinic.  The clinic phone number is [phone]. Office hours are Monday-Friday 8:30am-5:00pm.  BELOW ARE SYMPTOMS THAT SHOULD BE REPORTED IMMEDIATELY:  *FEVER GREATER THAN 101.0 F  *CHILLS WITH OR WITHOUT FEVER  NAUSEA AND VOMITING THAT IS NOT CONTROLLED WITH YOUR NAUSEA MEDICATION  *UNUSUAL SHORTNESS OF BREATH  *UNUSUAL BRUISING OR BLEEDING  TENDERNESS IN MOUTH AND THROAT WITH OR WITHOUT PRESENCE OF ULCERS  *URINARY PROBLEMS  *BOWEL PROBLEMS  UNUSUAL RASH Items with * indicate a potential emergency and should be followed up as soon as possible. If you have an emergency after office hours please contact your primary care physician or go to the nearest emergency department.  Please call the clinic during office hours if you have any questions or concerns.   You may also contact the Patient Navigator at [phone] should you have any questions or need assistance in obtaining follow up care.      Resources For Cancer Patients and their Caregivers ? American Cancer Society: Can assist with transportation, wigs, general needs, runs Look Good Feel Better.        1-888-227-6333 ? Cancer Care: Provides financial assistance, online support groups, medication/co-pay assistance.  1-800-813-HOPE (4673) ? [facility] Assists [place] cancer  patients and their families through emotional , educational and financial support.  336-427-4357 ? [place] DSS Where to apply for food stamps, Medicaid and utility assistance. 336-342-1394 ? [employer]: Transportation to medical appointments. 336-347-2287 ? Social Security Administration: May apply for disability if have a Stage IV cancer. 336-342-7796 1-800-772-1213 ? [place] Aging, Disability and Transit Services: Assists with nutrition, care and transit needs. [phone]         

## 2017-06-05 NOTE — Progress Notes (Signed)
Chemotherapy done, no issues. Patient tolerated it well. Vitals stable and discharged home from clinic ambulatory. Follow up as scheduled.

## 2017-06-12 ENCOUNTER — Encounter (HOSPITAL_COMMUNITY): Payer: Self-pay

## 2017-06-12 ENCOUNTER — Encounter (HOSPITAL_BASED_OUTPATIENT_CLINIC_OR_DEPARTMENT_OTHER): Payer: BLUE CROSS/BLUE SHIELD | Admitting: Oncology

## 2017-06-12 ENCOUNTER — Encounter (HOSPITAL_BASED_OUTPATIENT_CLINIC_OR_DEPARTMENT_OTHER): Payer: BLUE CROSS/BLUE SHIELD

## 2017-06-12 VITALS — BP 121/82 | HR 105 | Resp 16

## 2017-06-12 VITALS — BP 118/63 | HR 87 | Temp 98.3°F | Resp 18

## 2017-06-12 DIAGNOSIS — C50512 Malignant neoplasm of lower-outer quadrant of left female breast: Secondary | ICD-10-CM

## 2017-06-12 DIAGNOSIS — Z5111 Encounter for antineoplastic chemotherapy: Secondary | ICD-10-CM

## 2017-06-12 DIAGNOSIS — C773 Secondary and unspecified malignant neoplasm of axilla and upper limb lymph nodes: Secondary | ICD-10-CM

## 2017-06-12 DIAGNOSIS — Z171 Estrogen receptor negative status [ER-]: Principal | ICD-10-CM

## 2017-06-12 LAB — CBC WITH DIFFERENTIAL/PLATELET
BASOS ABS: 0 10*3/uL (ref 0.0–0.1)
Basophils Relative: 0 %
EOS PCT: 1 %
Eosinophils Absolute: 0 10*3/uL (ref 0.0–0.7)
HEMATOCRIT: 34.9 % — AB (ref 36.0–46.0)
Hemoglobin: 11.4 g/dL — ABNORMAL LOW (ref 12.0–15.0)
LYMPHS PCT: 22 %
Lymphs Abs: 1.2 10*3/uL (ref 0.7–4.0)
MCH: 33.9 pg (ref 26.0–34.0)
MCHC: 32.7 g/dL (ref 30.0–36.0)
MCV: 103.9 fL — ABNORMAL HIGH (ref 78.0–100.0)
Monocytes Absolute: 0.5 10*3/uL (ref 0.1–1.0)
Monocytes Relative: 10 %
NEUTROS ABS: 3.5 10*3/uL (ref 1.7–7.7)
NEUTROS PCT: 67 %
PLATELETS: 360 10*3/uL (ref 150–400)
RBC: 3.36 MIL/uL — AB (ref 3.87–5.11)
RDW: 15.7 % — ABNORMAL HIGH (ref 11.5–15.5)
WBC: 5.3 10*3/uL (ref 4.0–10.5)

## 2017-06-12 LAB — COMPREHENSIVE METABOLIC PANEL
ALT: 31 U/L (ref 14–54)
AST: 28 U/L (ref 15–41)
Albumin: 3.8 g/dL (ref 3.5–5.0)
Alkaline Phosphatase: 80 U/L (ref 38–126)
Anion gap: 8 (ref 5–15)
BUN: 13 mg/dL (ref 6–20)
CHLORIDE: 106 mmol/L (ref 101–111)
CO2: 24 mmol/L (ref 22–32)
CREATININE: 0.73 mg/dL (ref 0.44–1.00)
Calcium: 9.3 mg/dL (ref 8.9–10.3)
GFR calc Af Amer: >60 mL/min (ref >60)
Glucose, Bld: 115 mg/dL — ABNORMAL HIGH (ref 65–99)
Potassium: 3.8 mmol/L (ref 3.5–5.1)
Sodium: 138 mmol/L (ref 135–145)
Total Bilirubin: 0.9 mg/dL (ref 0.3–1.2)
Total Protein: 6.7 g/dL (ref 6.5–8.1)

## 2017-06-12 LAB — URINALYSIS, DIPSTICK ONLY
BILIRUBIN URINE: NEGATIVE
GLUCOSE, UA: NEGATIVE mg/dL
Hgb urine dipstick: NEGATIVE
KETONES UR: NEGATIVE mg/dL
Leukocytes, UA: NEGATIVE
Nitrite: NEGATIVE
PH: 5.5 (ref 5.0–8.0)
Protein, ur: NEGATIVE mg/dL
SPECIFIC GRAVITY, URINE: 1.025 (ref 1.005–1.030)

## 2017-06-12 MED ORDER — DEXTROSE 5 % IV SOLN
80.0000 mg/m2 | Freq: Once | INTRAVENOUS | Status: AC
  Administered 2017-06-12: 168 mg via INTRAVENOUS
  Filled 2017-06-12: qty 28

## 2017-06-12 MED ORDER — HEPARIN SOD (PORK) LOCK FLUSH 100 UNIT/ML IV SOLN
500.0000 [IU] | Freq: Once | INTRAVENOUS | Status: AC | PRN
  Administered 2017-06-12: 500 [IU]
  Filled 2017-06-12: qty 5

## 2017-06-12 MED ORDER — DIPHENHYDRAMINE HCL 50 MG/ML IJ SOLN
25.0000 mg | Freq: Once | INTRAMUSCULAR | Status: AC
  Administered 2017-06-12: 25 mg via INTRAVENOUS
  Filled 2017-06-12: qty 1

## 2017-06-12 MED ORDER — SODIUM CHLORIDE 0.9% FLUSH
10.0000 mL | INTRAVENOUS | Status: DC | PRN
  Administered 2017-06-12: 10 mL
  Filled 2017-06-12: qty 10

## 2017-06-12 MED ORDER — SODIUM CHLORIDE 0.9 % IV SOLN
Freq: Once | INTRAVENOUS | Status: AC
  Administered 2017-06-12: 13:00:00 via INTRAVENOUS

## 2017-06-12 MED ORDER — FAMOTIDINE IN NACL 20-0.9 MG/50ML-% IV SOLN
20.0000 mg | Freq: Once | INTRAVENOUS | Status: AC
  Administered 2017-06-12: 20 mg via INTRAVENOUS
  Filled 2017-06-12: qty 50

## 2017-06-12 NOTE — Progress Notes (Signed)
Shannon Logan tolerated chemo tx well without complaints or incident. Labs reviewed with Dr. Talbert Cage prior to administering chemotherapy. VSS upon discharge. Pt discharged self ambulatory in satisfactory condition accompanied by her mother

## 2017-06-12 NOTE — Patient Instructions (Signed)
[facility] Discharge Instructions for Patients Receiving Chemotherapy   Beginning January 23rd 2017 lab work for the Cancer Center will be done in the  Main lab at [place] on 1st floor. If you have a lab appointment with the Cancer Center please come in thru the  Main Entrance and check in at the main information desk   Today you received the following chemotherapy agents Taxol. Follow-up as scheduled. Call clinic for any questions or concerns  To help prevent nausea and vomiting after your treatment, we encourage you to take your nausea medication   If you develop nausea and vomiting, or diarrhea that is not controlled by your medication, call the clinic.  The clinic phone number is [phone]. Office hours are Monday-Friday 8:30am-5:00pm.  BELOW ARE SYMPTOMS THAT SHOULD BE REPORTED IMMEDIATELY:  *FEVER GREATER THAN 101.0 F  *CHILLS WITH OR WITHOUT FEVER  NAUSEA AND VOMITING THAT IS NOT CONTROLLED WITH YOUR NAUSEA MEDICATION  *UNUSUAL SHORTNESS OF BREATH  *UNUSUAL BRUISING OR BLEEDING  TENDERNESS IN MOUTH AND THROAT WITH OR WITHOUT PRESENCE OF ULCERS  *URINARY PROBLEMS  *BOWEL PROBLEMS  UNUSUAL RASH Items with * indicate a potential emergency and should be followed up as soon as possible. If you have an emergency after office hours please contact your primary care physician or go to the nearest emergency department.  Please call the clinic during office hours if you have any questions or concerns.   You may also contact the Patient Navigator at [phone] should you have any questions or need assistance in obtaining follow up care.      Resources For Cancer Patients and their Caregivers ? American Cancer Society: Can assist with transportation, wigs, general needs, runs Look Good Feel Better.        1-888-227-6333 ? Cancer Care: Provides financial assistance, online support groups, medication/co-pay assistance.  1-800-813-HOPE  (4673) ? [facility] Assists [place] cancer patients and their families through emotional , educational and financial support.  336-427-4357 ? [place] DSS Where to apply for food stamps, Medicaid and utility assistance. 336-342-1394 ? [employer]: Transportation to medical appointments. 336-347-2287 ? Social Security Administration: May apply for disability if have a Stage IV cancer. 336-342-7796 1-800-772-1213 ? [place] Aging, Disability and Transit Services: Assists with nutrition, care and transit needs. 336-349-2343         

## 2017-06-12 NOTE — Progress Notes (Signed)
Shannon Logan, [place] [address]  No diagnosis found.  CURRENT THERAPY: Weekly Paclitaxel x 12  INTERVAL HISTORY: Shannon Logan 51 y.o. female returns for followup of Stage IIIA (pT2pN1AM0) invasive left breast cancer in the lower-outer quadrant, ER/PR/HER2 NEGATIVE, S/P left breast lumpectomy with positive margins by Dr. Ninfa Linden on 01/22/2017 with one positive left axillary lymph node positive for metastatic disease resulting in a re-excision on 01/30/2017 and left axillary lymph node dissection with invasive component focally 0.1- 0.2 cm away from anterior margin, but inked margin surface is negative and 0/6 lymph nodes.  Patient presents today for continued follow-up. She is to receive cycle 10 of Taxol today. She stated that she noted that after her last treatment with Taxol she was passing some small blood clots in her urine for about 1-2 days but it has resolved. She denies any urinary tract infection symptoms including dysuria or urinary frequency. She states that her toenails have been darkening and she experiences pain under her toenails for about 1-2 days after she receives Taxol but then her symptoms resolved. She states that the neuropathy in her fingers and have been stable.  Review of Systems  Constitutional: Negative.  Negative for chills, fever and weight loss.  HENT: Negative for nosebleeds.   Eyes: Negative.   Respiratory: Negative.  Negative for cough.   Cardiovascular: Positive for leg swelling. Negative for chest pain.  Gastrointestinal: Negative.  Negative for blood in stool, constipation, diarrhea, melena, nausea and vomiting.  Genitourinary: Negative.        Had some small blood clots in her urine a few days after last chemo  Musculoskeletal: Negative.        Darkening of toenails  Skin: Negative.   Neurological: Negative for weakness.       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     Psychiatric/Behavioral: Negative.     Past Medical History:  Diagnosis Date  . Breast cancer of lower-outer quadrant of left female breast ([place]) 01/03/2017  . Cancer ([place]) 11/22/2016   Breast   . Family history of breast cancer   . GERD (gastroesophageal reflux disease)     Past Surgical History:  Procedure Laterality Date  . BREAST LUMPECTOMY WITH AXILLARY LYMPH NODE DISSECTION Left 01/30/2017   Procedure: RE-EXCISION OF LEFT BREAST LUMPECTOMY AND LEFT AXILLARY LYMPH NODE DISSECTION;  Surgeon: Coralie Keens, MD;  Location: [facility];  Service: General;  Laterality: Left;  . BREAST LUMPECTOMY WITH RADIOACTIVE SEED AND SENTINEL LYMPH NODE BIOPSY Left 01/22/2017   Procedure: BREAST LUMPECTOMY WITH RADIOACTIVE SEED AND SENTINEL LYMPH NODE BIOPSY;  Surgeon: Coralie Keens, MD;  Location: [facility];  Service: General;  Laterality: Left;  . COLONOSCOPY N/A 10/26/2015   Procedure: COLONOSCOPY;  Surgeon: Rogene Houston, MD;  [facility];  Service: Endoscopy;  Laterality: N/A;  . DILATION AND CURETTAGE OF UTERUS    . ESOPHAGOGASTRODUODENOSCOPY N/A 10/26/2015   Procedure: ESOPHAGOGASTRODUODENOSCOPY (EGD);  Surgeon: Rogene Houston, MD;  [facility];  Service: Endoscopy;  Laterality: N/A;  1:00  . PORTACATH PLACEMENT Right 01/22/2017   Procedure: INSERTION PORT-A-CATH;  Surgeon: Coralie Keens, MD;  Location: [facility];  Service: General;  Laterality: Right;  . URETHRAL DILATION      Family History  Problem Relation Age of Onset  . Hypertension Shannon Logan   . Hyperlipidemia Shannon Logan   . Heart disease Father   .  Hypertension Father   . Diabetes Father   . Aneurysm Maternal Grandmother   . Stroke Maternal Grandfather   . Heart disease Paternal Grandfather   . Breast cancer Other        Shannon Logan  . Breast cancer Other        Shannon Logan  . Breast cancer Other        Shannon aunt (great, great aunt)  . Breast cancer  Other        PGM's Shannon Logan    Social History   Social History  . Marital status: Married    Spouse name: [name]  . Number of children: 3  . Years of education: N/A   Social History Main Topics  . Smoking status: Former Smoker    Quit date: 07/23/2008  . Smokeless tobacco: Never Used     Comment: quit smoking 07/23/2008 after smoking 30 yrs. (1/2 pack a day)  . Alcohol use 0.0 oz/week     Comment: rare  . Drug use: No  . Sexual activity: Yes   Other Topics Concern  . None   Social History Narrative  . None     PHYSICAL EXAMINATION  ECOG PERFORMANCE STATUS: 1 - Symptomatic but completely ambulatory  Vitals:   06/12/17 1135  BP: 121/82  Pulse: (!) 105  Resp: 16    Vitals - 1 value per visit 1/96/2229  SYSTOLIC 798  DIASTOLIC 76  Pulse 92  Temperature 98  Respirations 18  Weight (lb) 216   Physical Exam  Constitutional: She is oriented to person, place, and time. She appears well-developed and well-nourished. No distress.  HENT:  Head: Normocephalic and atraumatic.  Mouth/Throat: No oropharyngeal exudate.  Eyes: EOM are normal. Pupils are equal, round, and reactive to light. No scleral icterus.  Neck: Normal range of motion. Neck supple.  Cardiovascular: Normal rate and regular rhythm.  Exam reveals no gallop and no friction rub.   No murmur heard. Pulmonary/Chest: Effort normal and breath sounds normal. No respiratory distress.  Abdominal: Soft. Bowel sounds are normal. She exhibits no distension.  Musculoskeletal: Normal range of motion. She exhibits edema (1+ ankle edema).  Darkening of toenails due to taxol  Neurological: She is alert and oriented to person, place, and time. No cranial nerve deficit.  Skin: Skin is warm and dry. No erythema.  Psychiatric: She has a normal mood and affect. Her behavior is normal. Judgment and thought content normal.     LABORATORY DATA: CBC    Component Value Date/Time   WBC 5.3 06/12/2017 1134   RBC 3.36 (L) 06/12/2017  1134   HGB 11.4 (L) 06/12/2017 1134   HCT 34.9 (L) 06/12/2017 1134   PLT 360 06/12/2017 1134   MCV 103.9 (H) 06/12/2017 1134   MCV 90.2 07/02/2013 1523   MCH 33.9 06/12/2017 1134   MCHC 32.7 06/12/2017 1134   RDW 15.7 (H) 06/12/2017 1134   LYMPHSABS 1.2 06/12/2017 1134   MONOABS 0.5 06/12/2017 1134   EOSABS 0.0 06/12/2017 1134   BASOSABS 0.0 06/12/2017 1134      Chemistry      Component Value Date/Time   NA 138 06/12/2017 1134   NA 144 10/25/2016 0910   K 3.8 06/12/2017 1134   CL 106 06/12/2017 1134   CO2 24 06/12/2017 1134   BUN 13 06/12/2017 1134   BUN 13 10/25/2016 0910   CREATININE 0.73 06/12/2017 1134   CREATININE 0.78 07/02/2013 1448      Component Value Date/Time  CALCIUM 9.3 06/12/2017 1134   ALKPHOS 80 06/12/2017 1134   AST 28 06/12/2017 1134   ALT 31 06/12/2017 1134   BILITOT 0.9 06/12/2017 1134   BILITOT 0.9 10/25/2016 0910        ASSESSMENT AND PLAN:  Stage IIIA (pT2pN1AM0) left breast cancer in the lower-outer quadrant, ER/PR/HER2 NEGATIVE, S/P left lumpectomy with positive margins by Dr. Ninfa Linden on 01/22/2017 with one positive left axillary node with positive for metastatic disease resulting in a re-excision on 01/30/2017 and left axillary lymph node dissection with invasive component focally 0.1-0.2 cm away from anterior margin but inked margin surface is negative and 0/6 benign lymph nodes.  I am unclear what these blood clots in her urine are from. I will do a urine dipstick today to see if there is any evidence of hematuria. Patient does state that her symptoms have resolved. I have told her to call us if this happens again after today's cycle of Taxol. I have reassured her that her nail changes are secondary to the Taxol and the darkening of her toenails will improve once she is off treatment in the nails growing out. Proceed with cycle 10 of Taxol today. Labs reviewed. Patient is going on vacation to [facility] next week, therefore we'll defer her  cycle 11 of Taxol to July 9. Return to clinic for follow-up and last cycle 12 of Taxol on July 16.  THERAPY PLAN:  Weekly Taxol for total of 12 cycles  All questions were answered. The patient knows to call the clinic with any problems, questions or concerns. We can certainly see the patient much sooner if necessary.    This note is electronically signed by: Twana First, MD 06/12/2017 2:08 PM

## 2017-06-17 ENCOUNTER — Other Ambulatory Visit (INDEPENDENT_AMBULATORY_CARE_PROVIDER_SITE_OTHER): Payer: Self-pay | Admitting: Internal Medicine

## 2017-06-17 NOTE — Telephone Encounter (Signed)
Patient may need to have OC as she has not been seen in the office in a while. Or she may get refills from PCP.

## 2017-06-19 ENCOUNTER — Encounter (INDEPENDENT_AMBULATORY_CARE_PROVIDER_SITE_OTHER): Payer: Self-pay | Admitting: Internal Medicine

## 2017-06-19 NOTE — Telephone Encounter (Signed)
An appointment was given to the patient for 08/25/17 at 9:30am.  A letter was mailed to the patient.

## 2017-06-23 ENCOUNTER — Encounter (HOSPITAL_COMMUNITY): Payer: BLUE CROSS/BLUE SHIELD | Attending: Hematology & Oncology

## 2017-06-23 ENCOUNTER — Encounter (HOSPITAL_COMMUNITY): Payer: Self-pay

## 2017-06-23 ENCOUNTER — Ambulatory Visit (HOSPITAL_COMMUNITY): Payer: BLUE CROSS/BLUE SHIELD | Admitting: Oncology

## 2017-06-23 VITALS — BP 124/65 | HR 79 | Temp 98.5°F | Resp 18 | Wt 224.0 lb

## 2017-06-23 DIAGNOSIS — Z171 Estrogen receptor negative status [ER-]: Secondary | ICD-10-CM | POA: Diagnosis present

## 2017-06-23 DIAGNOSIS — Z5111 Encounter for antineoplastic chemotherapy: Secondary | ICD-10-CM

## 2017-06-23 DIAGNOSIS — Z452 Encounter for adjustment and management of vascular access device: Secondary | ICD-10-CM | POA: Diagnosis not present

## 2017-06-23 DIAGNOSIS — C50512 Malignant neoplasm of lower-outer quadrant of left female breast: Secondary | ICD-10-CM | POA: Diagnosis present

## 2017-06-23 LAB — COMPREHENSIVE METABOLIC PANEL
ALT: 22 U/L (ref 14–54)
ANION GAP: 9 (ref 5–15)
AST: 23 U/L (ref 15–41)
Albumin: 3.6 g/dL (ref 3.5–5.0)
Alkaline Phosphatase: 80 U/L (ref 38–126)
BILIRUBIN TOTAL: 0.7 mg/dL (ref 0.3–1.2)
BUN: 13 mg/dL (ref 6–20)
CO2: 23 mmol/L (ref 22–32)
Calcium: 9 mg/dL (ref 8.9–10.3)
Chloride: 106 mmol/L (ref 101–111)
Creatinine, Ser: 0.73 mg/dL (ref 0.44–1.00)
GFR calc Af Amer: >60 mL/min (ref >60)
Glucose, Bld: 134 mg/dL — ABNORMAL HIGH (ref 65–99)
Potassium: 4 mmol/L (ref 3.5–5.1)
Sodium: 138 mmol/L (ref 135–145)
TOTAL PROTEIN: 6.4 g/dL — AB (ref 6.5–8.1)

## 2017-06-23 LAB — CBC WITH DIFFERENTIAL/PLATELET
Basophils Absolute: 0 10*3/uL (ref 0.0–0.1)
Basophils Relative: 0 %
Eosinophils Absolute: 0 10*3/uL (ref 0.0–0.7)
Eosinophils Relative: 1 %
HEMATOCRIT: 33.7 % — AB (ref 36.0–46.0)
Hemoglobin: 11 g/dL — ABNORMAL LOW (ref 12.0–15.0)
LYMPHS PCT: 24 %
Lymphs Abs: 1.3 10*3/uL (ref 0.7–4.0)
MCH: 33.8 pg (ref 26.0–34.0)
MCHC: 32.6 g/dL (ref 30.0–36.0)
MCV: 103.7 fL — AB (ref 78.0–100.0)
MONO ABS: 0.8 10*3/uL (ref 0.1–1.0)
MONOS PCT: 16 %
NEUTROS ABS: 3.1 10*3/uL (ref 1.7–7.7)
Neutrophils Relative %: 59 %
Platelets: 294 10*3/uL (ref 150–400)
RBC: 3.25 MIL/uL — ABNORMAL LOW (ref 3.87–5.11)
RDW: 15 % (ref 11.5–15.5)
WBC: 5.2 10*3/uL (ref 4.0–10.5)

## 2017-06-23 MED ORDER — ALTEPLASE 2 MG IJ SOLR
2.0000 mg | Freq: Once | INTRAMUSCULAR | Status: AC
  Administered 2017-06-23: 2 mg

## 2017-06-23 MED ORDER — ALTEPLASE 2 MG IJ SOLR
INTRAMUSCULAR | Status: AC
  Filled 2017-06-23: qty 2

## 2017-06-23 MED ORDER — SODIUM CHLORIDE 0.9 % IV SOLN
Freq: Once | INTRAVENOUS | Status: AC
  Administered 2017-06-23: 13:00:00 via INTRAVENOUS

## 2017-06-23 MED ORDER — FAMOTIDINE IN NACL 20-0.9 MG/50ML-% IV SOLN
20.0000 mg | Freq: Once | INTRAVENOUS | Status: AC
  Administered 2017-06-23: 20 mg via INTRAVENOUS

## 2017-06-23 MED ORDER — STERILE WATER FOR INJECTION IJ SOLN
INTRAMUSCULAR | Status: AC
Start: 2017-06-23 — End: ?
  Filled 2017-06-23: qty 10

## 2017-06-23 MED ORDER — HEPARIN SOD (PORK) LOCK FLUSH 100 UNIT/ML IV SOLN
500.0000 [IU] | Freq: Once | INTRAVENOUS | Status: AC | PRN
  Administered 2017-06-23: 500 [IU]
  Filled 2017-06-23 (×2): qty 5

## 2017-06-23 MED ORDER — PACLITAXEL CHEMO INJECTION 300 MG/50ML
80.0000 mg/m2 | Freq: Once | INTRAVENOUS | Status: AC
  Administered 2017-06-23: 168 mg via INTRAVENOUS
  Filled 2017-06-23: qty 28

## 2017-06-23 MED ORDER — DIPHENHYDRAMINE HCL 50 MG/ML IJ SOLN
INTRAMUSCULAR | Status: AC
Start: 2017-06-23 — End: ?
  Filled 2017-06-23: qty 1

## 2017-06-23 MED ORDER — DIPHENHYDRAMINE HCL 50 MG/ML IJ SOLN
25.0000 mg | Freq: Once | INTRAMUSCULAR | Status: AC
  Administered 2017-06-23: 25 mg via INTRAVENOUS

## 2017-06-23 MED ORDER — FAMOTIDINE IN NACL 20-0.9 MG/50ML-% IV SOLN
INTRAVENOUS | Status: AC
  Filled 2017-06-23: qty 50

## 2017-06-23 MED ORDER — SODIUM CHLORIDE 0.9% FLUSH
10.0000 mL | INTRAVENOUS | Status: DC | PRN
  Administered 2017-06-23: 10 mL
  Filled 2017-06-23: qty 10

## 2017-06-23 NOTE — Progress Notes (Signed)
altaplase administered today per protocol. 2 ml drawn back with blood return 70min after administration.   Chemotherapy given today per orders. Patient tolerated it well without problems. Vitals stable and discharged home from clinic ambulatory. Follow up as scheduled.

## 2017-06-23 NOTE — Patient Instructions (Signed)
[facility] Discharge Instructions for Patients Receiving Chemotherapy   Beginning January 23rd 2017 lab work for the Cancer Center will be done in the  Main lab at [facility] on 1st floor. If you have a lab appointment with the Cancer Center please come in thru the  Main Entrance and check in at the main information desk   Today you received the following chemotherapy agents   To help prevent nausea and vomiting after your treatment, we encourage you to take your nausea medication     If you develop nausea and vomiting, or diarrhea that is not controlled by your medication, call the clinic.  The clinic phone number is [phone]. Office hours are Monday-Friday 8:30am-5:00pm.  BELOW ARE SYMPTOMS THAT SHOULD BE REPORTED IMMEDIATELY:  *FEVER GREATER THAN 101.0 F  *CHILLS WITH OR WITHOUT FEVER  NAUSEA AND VOMITING THAT IS NOT CONTROLLED WITH YOUR NAUSEA MEDICATION  *UNUSUAL SHORTNESS OF BREATH  *UNUSUAL BRUISING OR BLEEDING  TENDERNESS IN MOUTH AND THROAT WITH OR WITHOUT PRESENCE OF ULCERS  *URINARY PROBLEMS  *BOWEL PROBLEMS  UNUSUAL RASH Items with * indicate a potential emergency and should be followed up as soon as possible. If you have an emergency after office hours please contact your primary care physician or go to the nearest emergency department.  Please call the clinic during office hours if you have any questions or concerns.   You may also contact the Patient Navigator at [phone] should you have any questions or need assistance in obtaining follow up care.      Resources For Cancer Patients and their Caregivers ? American Cancer Society: Can assist with transportation, wigs, general needs, runs Look Good Feel Better.        1-888-227-6333 ? Cancer Care: Provides financial assistance, online support groups, medication/co-pay assistance.  1-800-813-HOPE (4673) ? [facility] Assists [place] cancer  patients and their families through emotional , educational and financial support.  336-427-4357 ? [place] DSS Where to apply for food stamps, Medicaid and utility assistance. 336-342-1394 ? [facility]: Transportation to medical appointments. 336-347-2287 ? Social Security Administration: May apply for disability if have a Stage IV cancer. 336-342-7796 1-800-772-1213 ? [place] Aging, Disability and Transit Services: Assists with nutrition, care and transit needs. [phone]         

## 2017-06-30 ENCOUNTER — Encounter (HOSPITAL_BASED_OUTPATIENT_CLINIC_OR_DEPARTMENT_OTHER): Payer: BLUE CROSS/BLUE SHIELD

## 2017-06-30 ENCOUNTER — Encounter (HOSPITAL_COMMUNITY): Payer: Self-pay | Admitting: Lab

## 2017-06-30 ENCOUNTER — Encounter (HOSPITAL_BASED_OUTPATIENT_CLINIC_OR_DEPARTMENT_OTHER): Payer: BLUE CROSS/BLUE SHIELD | Admitting: Oncology

## 2017-06-30 ENCOUNTER — Encounter (HOSPITAL_COMMUNITY): Payer: Self-pay

## 2017-06-30 VITALS — BP 126/71 | HR 86 | Temp 98.7°F | Resp 16

## 2017-06-30 VITALS — BP 136/66 | HR 85 | Temp 98.0°F | Resp 18 | Wt 223.8 lb

## 2017-06-30 DIAGNOSIS — C50512 Malignant neoplasm of lower-outer quadrant of left female breast: Secondary | ICD-10-CM | POA: Diagnosis not present

## 2017-06-30 DIAGNOSIS — Z5111 Encounter for antineoplastic chemotherapy: Secondary | ICD-10-CM | POA: Diagnosis not present

## 2017-06-30 DIAGNOSIS — Z171 Estrogen receptor negative status [ER-]: Secondary | ICD-10-CM | POA: Diagnosis not present

## 2017-06-30 DIAGNOSIS — C773 Secondary and unspecified malignant neoplasm of axilla and upper limb lymph nodes: Secondary | ICD-10-CM

## 2017-06-30 LAB — COMPREHENSIVE METABOLIC PANEL
ALK PHOS: 78 U/L (ref 38–126)
ALT: 26 U/L (ref 14–54)
ANION GAP: 5 (ref 5–15)
AST: 25 U/L (ref 15–41)
Albumin: 3.5 g/dL (ref 3.5–5.0)
BUN: 11 mg/dL (ref 6–20)
CALCIUM: 8.9 mg/dL (ref 8.9–10.3)
CO2: 24 mmol/L (ref 22–32)
CREATININE: 0.77 mg/dL (ref 0.44–1.00)
Chloride: 110 mmol/L (ref 101–111)
Glucose, Bld: 133 mg/dL — ABNORMAL HIGH (ref 65–99)
Potassium: 4.2 mmol/L (ref 3.5–5.1)
Sodium: 139 mmol/L (ref 135–145)
Total Bilirubin: 0.6 mg/dL (ref 0.3–1.2)
Total Protein: 6.3 g/dL — ABNORMAL LOW (ref 6.5–8.1)

## 2017-06-30 LAB — CBC WITH DIFFERENTIAL/PLATELET
Basophils Absolute: 0 10*3/uL (ref 0.0–0.1)
Basophils Relative: 0 %
EOS ABS: 0.1 10*3/uL (ref 0.0–0.7)
Eosinophils Relative: 2 %
HCT: 34.2 % — ABNORMAL LOW (ref 36.0–46.0)
HEMOGLOBIN: 11.2 g/dL — AB (ref 12.0–15.0)
LYMPHS ABS: 1.2 10*3/uL (ref 0.7–4.0)
LYMPHS PCT: 22 %
MCH: 33.4 pg (ref 26.0–34.0)
MCHC: 32.7 g/dL (ref 30.0–36.0)
MCV: 102.1 fL — AB (ref 78.0–100.0)
Monocytes Absolute: 0.4 10*3/uL (ref 0.1–1.0)
Monocytes Relative: 7 %
NEUTROS PCT: 69 %
Neutro Abs: 4 10*3/uL (ref 1.7–7.7)
Platelets: 285 10*3/uL (ref 150–400)
RBC: 3.35 MIL/uL — AB (ref 3.87–5.11)
RDW: 14.7 % (ref 11.5–15.5)
WBC: 5.7 10*3/uL (ref 4.0–10.5)

## 2017-06-30 MED ORDER — FAMOTIDINE IN NACL 20-0.9 MG/50ML-% IV SOLN
20.0000 mg | Freq: Once | INTRAVENOUS | Status: AC
  Administered 2017-06-30: 20 mg via INTRAVENOUS
  Filled 2017-06-30: qty 50

## 2017-06-30 MED ORDER — SODIUM CHLORIDE 0.9 % IV SOLN
Freq: Once | INTRAVENOUS | Status: AC
  Administered 2017-06-30: 11:00:00 via INTRAVENOUS

## 2017-06-30 MED ORDER — DIPHENHYDRAMINE HCL 50 MG/ML IJ SOLN
25.0000 mg | Freq: Once | INTRAMUSCULAR | Status: AC
  Administered 2017-06-30: 25 mg via INTRAVENOUS
  Filled 2017-06-30: qty 1

## 2017-06-30 MED ORDER — HEPARIN SOD (PORK) LOCK FLUSH 100 UNIT/ML IV SOLN
INTRAVENOUS | Status: AC
  Filled 2017-06-30: qty 5

## 2017-06-30 MED ORDER — PACLITAXEL CHEMO INJECTION 300 MG/50ML
80.0000 mg/m2 | Freq: Once | INTRAVENOUS | Status: AC
  Administered 2017-06-30: 168 mg via INTRAVENOUS
  Filled 2017-06-30: qty 28

## 2017-06-30 MED ORDER — HEPARIN SOD (PORK) LOCK FLUSH 100 UNIT/ML IV SOLN
500.0000 [IU] | Freq: Once | INTRAVENOUS | Status: AC | PRN
  Administered 2017-06-30: 500 [IU]

## 2017-06-30 NOTE — Progress Notes (Signed)
Chemotherapy given today per orders. Patient tolerated it well without problems. Vitals stable and discharged home from clinic ambulatory.follow up as scheduled. 

## 2017-06-30 NOTE — Progress Notes (Unsigned)
Referral sent to [facility]. Records faxed on 7/16

## 2017-06-30 NOTE — Progress Notes (Signed)
Shannon Logan, [place] [address]  No diagnosis found.  CURRENT THERAPY: Weekly Paclitaxel x 12  INTERVAL HISTORY: Shannon Logan 51 y.o. female returns for followup of Stage IIIA (pT2pN1AM0) invasive left breast cancer in the lower-outer quadrant, ER/PR/HER2 NEGATIVE, S/P left breast lumpectomy with positive margins by Dr. Ninfa Linden on 01/22/2017 with one positive left axillary lymph node positive for metastatic disease resulting in a re-excision on 01/30/2017 and left axillary lymph node dissection with invasive component focally 0.1- 0.2 cm away from anterior margin, but inked margin surface is negative and 0/6 lymph nodes.  Patient presents today for continued follow-up. She is to receive cycle 12 of Taxol today. She stated that she had a good time on her vacation without any issues. She states she's noted that her left breast areola feels "leathery" and some time she has "little grains of sand" pass from her nipples. She states that her toenails remain dark and she experiences pain under her toenails for about 1-2 days after she receives Taxol but then her symptoms resolved. She states that the neuropathy in her fingers and have been stable.  Review of Systems  Constitutional: Negative.  Negative for chills, fever and weight loss.  HENT: Negative for nosebleeds.   Eyes: Negative.   Respiratory: Negative.  Negative for cough.   Cardiovascular: Negative for chest pain and leg swelling.  Gastrointestinal: Negative.  Negative for blood in stool, constipation, diarrhea, melena, nausea and vomiting.  Genitourinary: Negative.   Musculoskeletal: Negative.        Darkening of toenails  Skin: Negative.   Neurological: Negative for weakness.       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   Psychiatric/Behavioral: Negative.     Past Medical History:  Diagnosis Date  . Breast cancer of lower-outer quadrant of left female breast ([place])  01/03/2017  . Cancer ([place]) 11/22/2016   Breast   . Family history of breast cancer   . GERD (gastroesophageal reflux disease)     Past Surgical History:  Procedure Laterality Date  . BREAST LUMPECTOMY WITH AXILLARY LYMPH NODE DISSECTION Left 01/30/2017   Procedure: RE-EXCISION OF LEFT BREAST LUMPECTOMY AND LEFT AXILLARY LYMPH NODE DISSECTION;  Surgeon: Coralie Keens, MD;  Location: [facility];  Service: General;  Laterality: Left;  . BREAST LUMPECTOMY WITH RADIOACTIVE SEED AND SENTINEL LYMPH NODE BIOPSY Left 01/22/2017   Procedure: BREAST LUMPECTOMY WITH RADIOACTIVE SEED AND SENTINEL LYMPH NODE BIOPSY;  Surgeon: Coralie Keens, MD;  Location: [facility];  Service: General;  Laterality: Left;  . COLONOSCOPY N/A 10/26/2015   Procedure: COLONOSCOPY;  Surgeon: Rogene Houston, MD;  [facility];  Service: Endoscopy;  Laterality: N/A;  . DILATION AND CURETTAGE OF UTERUS    . ESOPHAGOGASTRODUODENOSCOPY N/A 10/26/2015   Procedure: ESOPHAGOGASTRODUODENOSCOPY (EGD);  Surgeon: Rogene Houston, MD;  [facility];  Service: Endoscopy;  Laterality: N/A;  1:00  . PORTACATH PLACEMENT Right 01/22/2017   Procedure: INSERTION PORT-A-CATH;  Surgeon: Coralie Keens, MD;  Location: [facility];  Service: General;  Laterality: Right;  . URETHRAL DILATION      Family History  Problem Relation Age of Onset  . Hypertension Mother   . Hyperlipidemia Mother   . Heart disease Father   . Hypertension Father   . Diabetes Father   . Aneurysm Maternal Grandmother   . Stroke Maternal Grandfather   . Heart disease Paternal  Grandfather   . Breast cancer Other        PGF's sister  . Breast cancer Other        PGF's sister  . Breast cancer Other        PGF's aunt (great, great aunt)  . Breast cancer Other        PGM's mother    Social History   Social History  . Marital status: Married    Spouse name: [name]  . Number of children: 3  .  Years of education: N/A   Social History Main Topics  . Smoking status: Former Smoker    Quit date: 07/23/2008  . Smokeless tobacco: Never Used     Comment: quit smoking 07/23/2008 after smoking 30 yrs. (1/2 pack a day)  . Alcohol use 0.0 oz/week     Comment: rare  . Drug use: No  . Sexual activity: Yes   Other Topics Concern  . None   Social History Narrative  . None     PHYSICAL EXAMINATION  ECOG PERFORMANCE STATUS: 1 - Symptomatic but completely ambulatory  Vitals:   06/30/17 0923  BP: 136/66  Pulse: 85  Resp: 18  Temp: 98 F (36.7 C)    Vitals - 1 value per visit 3/40/3709  SYSTOLIC 643  DIASTOLIC 76  Pulse 92  Temperature 98  Respirations 18  Weight (lb) 216   Physical Exam  Constitutional: She is oriented to person, place, and time. She appears well-developed and well-nourished. No distress.  HENT:  Head: Normocephalic and atraumatic.  Mouth/Throat: No oropharyngeal exudate.  Eyes: Pupils are equal, round, and reactive to light. EOM are normal. No scleral icterus.  Neck: Normal range of motion. Neck supple.  Cardiovascular: Normal rate and regular rhythm.  Exam reveals no gallop and no friction rub.   No murmur heard. Pulmonary/Chest: Effort normal and breath sounds normal. No respiratory distress.  Abdominal: Soft. Bowel sounds are normal. She exhibits no distension.  Musculoskeletal: Normal range of motion. She exhibits edema (1+ ankle edema).  Darkening of toenails due to taxol  Neurological: She is alert and oriented to person, place, and time. No cranial nerve deficit.  Skin: Skin is warm and dry. No erythema.  Psychiatric: She has a normal mood and affect. Her behavior is normal. Judgment and thought content normal.  BREAST: Left breast: inverted nipple, no discharge. Mild skin dimpling around the areola. No masses or axillary lymphadenopathy palpated. Right breast: No nipple changes no inversion, no skin changes. No masses or axillary lymphadenopathy  palpated.    LABORATORY DATA: CBC    Component Value Date/Time   WBC 5.2 06/23/2017 1131   RBC 3.25 (L) 06/23/2017 1131   HGB 11.0 (L) 06/23/2017 1131   HCT 33.7 (L) 06/23/2017 1131   PLT 294 06/23/2017 1131   MCV 103.7 (H) 06/23/2017 1131   MCV 90.2 07/02/2013 1523   MCH 33.8 06/23/2017 1131   MCHC 32.6 06/23/2017 1131   RDW 15.0 06/23/2017 1131   LYMPHSABS 1.3 06/23/2017 1131   MONOABS 0.8 06/23/2017 1131   EOSABS 0.0 06/23/2017 1131   BASOSABS 0.0 06/23/2017 1131      Chemistry      Component Value Date/Time   NA 138 06/23/2017 1131   NA 144 10/25/2016 0910   K 4.0 06/23/2017 1131   CL 106 06/23/2017 1131   CO2 23 06/23/2017 1131   BUN 13 06/23/2017 1131   BUN 13 10/25/2016 0910   CREATININE 0.73 06/23/2017 1131  CREATININE 0.78 07/02/2013 1448      Component Value Date/Time   CALCIUM 9.0 06/23/2017 1131   ALKPHOS 80 06/23/2017 1131   AST 23 06/23/2017 1131   ALT 22 06/23/2017 1131   BILITOT 0.7 06/23/2017 1131   BILITOT 0.9 10/25/2016 0910        ASSESSMENT AND PLAN:  Stage IIIA (pT2pN1AM0) left breast cancer in the lower-outer quadrant, ER/PR/HER2 NEGATIVE, S/P left lumpectomy with positive margins by Dr. Ninfa Linden on 01/22/2017 with one positive left axillary node with positive for metastatic disease resulting in a re-excision on 01/30/2017 and left axillary lymph node dissection with invasive component focally 0.1-0.2 cm away from anterior margin but inked margin surface is negative and 0/6 benign lymph nodes. Adjuvant dose dense AC q2weeks x4 cycles followed by weekly taxol x 12 cycles from 02/13/17-06/30/17.  Clinically NED on breast exam today. Proceed with cycle 12 of taxol today. Labs reviewed. This is her last cycle of adjuvant chemotherapy. I will make a stat referral to rad-onc for evaluation for adjuvant RT. RTC in 2 months for follow up with labs.  All questions were answered. The patient knows to call the clinic with any problems, questions or  concerns. We can certainly see the patient much sooner if necessary.    This note is electronically signed by: Twana First, MD 06/30/2017 9:35 AM

## 2017-06-30 NOTE — Progress Notes (Signed)
Zakari Couchman Condie's reason for visit today is for labs as scheduled per MD orders.  Venipuncture performed with a 23 gauge butterfly needle to R Antecubital.  [name] tolerated procedure well and without incident.

## 2017-07-01 ENCOUNTER — Encounter: Payer: Self-pay | Admitting: Radiation Oncology

## 2017-07-01 NOTE — Patient Instructions (Signed)
[facility] Discharge Instructions for Patients Receiving Chemotherapy   Beginning January 23rd 2017 lab work for the Cancer Center will be done in the  Main lab at [facility] on 1st floor. If you have a lab appointment with the Cancer Center please come in thru the  Main Entrance and check in at the main information desk   Today you received the following chemotherapy agents   To help prevent nausea and vomiting after your treatment, we encourage you to take your nausea medication     If you develop nausea and vomiting, or diarrhea that is not controlled by your medication, call the clinic.  The clinic phone number is [phone]. Office hours are Monday-Friday 8:30am-5:00pm.  BELOW ARE SYMPTOMS THAT SHOULD BE REPORTED IMMEDIATELY:  *FEVER GREATER THAN 101.0 F  *CHILLS WITH OR WITHOUT FEVER  NAUSEA AND VOMITING THAT IS NOT CONTROLLED WITH YOUR NAUSEA MEDICATION  *UNUSUAL SHORTNESS OF BREATH  *UNUSUAL BRUISING OR BLEEDING  TENDERNESS IN MOUTH AND THROAT WITH OR WITHOUT PRESENCE OF ULCERS  *URINARY PROBLEMS  *BOWEL PROBLEMS  UNUSUAL RASH Items with * indicate a potential emergency and should be followed up as soon as possible. If you have an emergency after office hours please contact your primary care physician or go to the nearest emergency department.  Please call the clinic during office hours if you have any questions or concerns.   You may also contact the Patient Navigator at [phone] should you have any questions or need assistance in obtaining follow up care.      Resources For Cancer Patients and their Caregivers ? American Cancer Society: Can assist with transportation, wigs, general needs, runs Look Good Feel Better.        1-888-227-6333 ? Cancer Care: Provides financial assistance, online support groups, medication/co-pay assistance.  1-800-813-HOPE (4673) ? [facility] Assists [place] cancer  patients and their families through emotional , educational and financial support.  336-427-4357 ? [place] DSS Where to apply for food stamps, Medicaid and utility assistance. 336-342-1394 ? [facility]: Transportation to medical appointments. 336-347-2287 ? Social Security Administration: May apply for disability if have a Stage IV cancer. 336-342-7796 1-800-772-1213 ? [place] Aging, Disability and Transit Services: Assists with nutrition, care and transit needs. 336-349-2343         

## 2017-07-02 NOTE — Progress Notes (Signed)
Location of Breast Cancer: Left Breast  Histology per Pathology Report:  11/21/16 Diagnosis Breast, left, needle core biopsy, 3:30 o'clock, 8 cm fn - INVASIVE DUCTAL CARCINOMA, GRADE 2.  Receptor Status: ER(NEG), PR (NEG), Her2-neu (NEG), Ki-(40%)  01/22/17 Diagnosis 1. Breast, lumpectomy, Left - INVASIVE GRADE 3 DUCTAL CARCINOMA, SPANNING 2.2 CM IN GREATEST DIMENSION. 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 - LYMPH/VASCULAR INVASION IS PRESENT. - ANTERIOR / MEDIAL MARGIN IS FOCALLY POSITIVE FOR INVASIVE DUCTAL CARCINOMA AND ANTERIOR MARGIN IS BROADLY POSITIVE FOR INVASIVE DUCTAL CARCINOMA. - DUCTAL CARCINOMA IN SITU IS FOCALLY LESS THAN 0.1 CM TO POSTERIOR MARGIN. - OTHER MARGINS ARE NEGATIVE. - SEE ONCOLOGY TEMPLATE. 2. Lymph node, sentinel, biopsy, Left axillary - ONE BENIGN LYMPH NODE WITH NO TUMOR SEEN (0/1). 3. Lymph node, sentinel, biopsy, Left axillary - ONE LYMPH NODE POSITIVE FOR METASTATIC DUCTAL CARCINOMA (1/1).  01/30/17 Diagnosis 1. Breast, excision, Left, new anterior and medial margins - RESIDUAL INVASIVE DUCTAL CARCINOMA AND EXTENSIVE RESIDUAL DUCTAL CARCINOMA IN SITU. - INVASIVE DUCTAL CARCINOMA IS FOCALLY 0.1 TO 0.2 CM AWAY FROM ANTERIOR MARGIN BUT INKED MARGINAL SURFACE IS NEGATIVE. - DUCTAL CARCINOMA IN SITU IS FOCALLY LESS THAN 0.1 CM AWAY FROM ANTERIOR MARGIN AND FOCALLY LESS THAN 0.1 CM AWAY FROM MEDIAL ASPECT OF ANTERIOR MARGIN. - SEE COMMENT. 2. Lymph nodes, regional resection, Left axillary contents - SIX BENIGN LYMPH NODES WITH NO TUMOR SEEN (0/6).   Did patient present with symptoms or was this found on screening mammography?: She self palpated the mass.   Past/Anticipated interventions by surgeon, if any: 01/22/17 Procedure(s): BREAST LUMPECTOMY WITH RADIOACTIVE SEED AND DEEP LEFT AXILLARY SENTINEL LYMPH NODE BIOPSY INSERTION PORT-A-CATH (RIGHT SUBCLAVIAN VEIN)  Surgeon(s): Coralie Keens,  MD 01/30/17 Procedure(s): RE-EXCISION OF LEFT BREAST LUMPECTOMY SITE ANTERIOR AND MEDIAL MARGIN  LEFT COMPLETE DEEP AXILLARY LYMPH NODE DISSECTION Surgeon(s): Coralie Keens, MD   Past/Anticipated interventions by medical oncology, if any:  06/30/17 Dr. Talbert Cage at [facility] documents:  Adjuvant dose dense AC q2weeks x4 cycles followed by weekly taxol x 12 cycles from 02/13/17-06/30/17.  Clinically NED on breast exam today. Proceed with cycle 12 of taxol today. Labs reviewed. This is her last cycle of adjuvant chemotherapy. I will make a stat referral to rad-onc for evaluation for adjuvant RT. RTC in 2 months for follow up with labs.  Lymphedema issues, if any: She is wearing a compression wrap. She is not seeing PT at this time. She is able to lift her left arm over her head  Pain issues, if any: She reports tingling in her hands and feet.   SAFETY ISSUES:  Prior radiation? No  Pacemaker/ICD? No  Possible current pregnancy? No, she is menopausal.   Is the patient on methotrexate? No  Current Complaints / other details: She reports issues with mucous excessive mucous production since receiving chemotherapy. She tells me that her airway becomes blocked when sleeping at times. She has made her MD/PA aware of this at [facility]. She has pictures on her phone today.   BP 130/74   Pulse 87   Temp 98.2 F (36.8 C)   Ht 5' 5" (1.651 m)   Wt 225 lb 3.2 oz (102.2 kg)   SpO2 99% Comment: room air  BMI 37.48 kg/m    Wt Readings from Last 3 Encounters:  07/07/17 225 lb 3.2 oz (102.2 kg)  06/30/17 223 lb 12.8 oz (101.5 kg)  06/23/17 224 lb (101.6 kg)

## 2017-07-07 ENCOUNTER — Encounter: Payer: Self-pay | Admitting: Radiation Oncology

## 2017-07-07 ENCOUNTER — Ambulatory Visit
Admission: RE | Admit: 2017-07-07 | Discharge: 2017-07-07 | Disposition: A | Discharge status: Home / Self Care | Payer: BLUE CROSS/BLUE SHIELD | Source: Ambulatory Visit | Attending: Radiation Oncology | Admitting: Radiation Oncology

## 2017-07-07 VITALS — BP 130/74 | HR 87 | Temp 98.2°F | Ht 65.0 in | Wt 225.2 lb

## 2017-07-07 DIAGNOSIS — Z9221 Personal history of antineoplastic chemotherapy: Secondary | ICD-10-CM | POA: Insufficient documentation

## 2017-07-07 DIAGNOSIS — Z886 Allergy status to analgesic agent status: Secondary | ICD-10-CM | POA: Insufficient documentation

## 2017-07-07 DIAGNOSIS — Z170 Estrogen receptor positive status [ER+]: Principal | ICD-10-CM

## 2017-07-07 DIAGNOSIS — Z882 Allergy status to sulfonamides status: Secondary | ICD-10-CM | POA: Insufficient documentation

## 2017-07-07 DIAGNOSIS — C50512 Malignant neoplasm of lower-outer quadrant of left female breast: Secondary | ICD-10-CM | POA: Diagnosis not present

## 2017-07-07 DIAGNOSIS — Z510 Encounter for antineoplastic radiation therapy: Secondary | ICD-10-CM | POA: Diagnosis not present

## 2017-07-07 DIAGNOSIS — R040 Epistaxis: Secondary | ICD-10-CM | POA: Diagnosis not present

## 2017-07-07 DIAGNOSIS — Z171 Estrogen receptor negative status [ER-]: Secondary | ICD-10-CM | POA: Insufficient documentation

## 2017-07-07 DIAGNOSIS — Z888 Allergy status to other drugs, medicaments and biological substances status: Secondary | ICD-10-CM | POA: Diagnosis not present

## 2017-07-07 DIAGNOSIS — M7989 Other specified soft tissue disorders: Secondary | ICD-10-CM | POA: Diagnosis not present

## 2017-07-07 NOTE — Progress Notes (Signed)
Radiation Oncology         ([phone]) [phone] ________________________________  Name: Shannon Logan MRN: [id number]  Date: 07/07/2017  DOB: 03-28-1966  Follow-Up Visit Note  Outpatient  CC: [name], MD  Twana First, MD  Diagnosis:      ICD-10-CM   1. Epistaxis R04.0 Ambulatory referral to ENT  2. Malignant neoplasm of lower-outer quadrant of left breast of female, estrogen receptor negative ([place]) C50.512 MM DIAG BREAST TOMO UNI LEFT   Z17.1 Ambulatory referral to Physical Therapy   Cancer Staging Breast cancer of lower-outer quadrant of left female breast [facility]) Staging form: Breast, AJCC 8th Edition - Pathologic stage from 02/05/2017: Stage IIIA (pT2, pN1a, cM0, G3, ER: Negative, PR: Negative, HER2: Negative) - Signed by Baird Cancer, PA-C on 02/05/2017    CHIEF COMPLAINT: Here to discuss management of left breast cancer  Narrative:  The patient returns today for follow-up.     Since consultation, she underwent lumpectomy and sentinel lymph node biopsy 01-22-17.  She had multiple close margins to DCIS and invasive disease despite re-excision on 01-30-17 but no tumor on ink.  At re-excision, six additional nodes were taken from the axilla.  1/2 SLN were positive initially, and the  6 nodes taken at re-excision were all negative. The tumor was 2.2cm, high grade DCIS and Grade 3 invasive ductal carcinoma.  Triple negative receptors.   She completed chemotherapy one week ago at [facility].  She reports on/off LUE swelling, helped by compression sleeve.  She reports excessive mucous production and continuous mucous, epistaxis since starting chemotherapy.  This affects her breathing through her nose at night.            ALLERGIES:  is allergic to asa [aspirin]; decadron [dexamethasone]; prednisone; and sulfa antibiotics.  Meds: Current Outpatient Prescriptions  Medication Sig Dispense Refill  . Ibuprofen (MOTRIN PO) Take by mouth.    . lidocaine-prilocaine (EMLA) cream Apply to  affected area once 30 g 3  . Loratadine 10 MG CAPS Take 1 capsule (10 mg total) by mouth daily. 30 each 2  . Misc. 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 1 each 0  . Misc. Devices MISC Please provide patient with compression sleeve for left arm due to swelling from lumpectomy with lymph node dissection. 2 each 0  . pantoprazole (PROTONIX) 40 MG tablet Take 1 Tablet by mouth once daily BEFORE breakfast 90 tablet 1  . CYCLOPHOSPHAMIDE IV Inject into the vein.    . promethazine (PHENERGAN) 25 MG tablet Take 1 tablet (25 mg total) by mouth every 6 (six) hours as needed for nausea or vomiting. (Patient not taking: Reported on 07/07/2017) 30 tablet 1   No current facility-administered medications for this encounter.     Physical Findings:  height is '5\' 5"'  (1.651 m) and weight is 225 lb 3.2 oz (102.2 kg). Her temperature is 98.2 F (36.8 C). Her blood pressure is 130/74 and her pulse is 87. Her oxygen saturation is 99%. .     General: Alert and oriented, in no acute distress HEENT: Head is normocephalic. Extraocular movements are intact. Oropharynx is clear. Neck: Neck is supple, no palpable cervical or supraclavicular lymphadenopathy. Heart: Regular in rate and rhythm with no murmurs, rubs, or gallops. Chest: Clear to auscultation bilaterally, with no rhonchi, wheezes, or rales. Abdomen: Soft, nontender, nondistended, with no rigidity or guarding. Extremities: + mild edema in LE's.  LUE compression sleeve Lymphatics: see Neck Exam Musculoskeletal:  symmetric strength and muscle tone throughout. Neurologic: No obvious focalities. Speech is fluent.  Psychiatric: Judgment and insight are intact. Affect is appropriate. Breast exam reveals firm scar tissue at LOQ lumpectomy scar of left breast. No other masses/lumps/adenopathy noted.  Left breast is mildly erythematous. Left nipple partial inversion.  Lab Findings: Lab Results    Component Value Date   WBC 5.7 06/30/2017   HGB 11.2 (L) 06/30/2017   HCT 34.2 (L) 06/30/2017   MCV 102.1 (H) 06/30/2017   PLT 285 06/30/2017      Radiographic Findings: No results found.  Impression/Plan: Left breast cancer  The following referrals were made today:  Orders Placed This Encounter  Procedures  . MM DIAG BREAST TOMO UNI LEFT  . Ambulatory referral to Physical Therapy  . Ambulatory referral to ENT   Mammogram as above to rule out residual disease - close margins.  PT for reported LUE swelling.  ENT for epitaxis, mucous production, affects sleep.  We discussed adjuvant radiotherapy today.  I recommend left breast and regional node radiotherapy  in order to reduce risk of locoregional recurrence by 2/3.  The risks, benefits and side effects of this treatment were discussed in detail.  She understands that radiotherapy is associated with skin irritation and fatigue in the acute setting. Late effects can include cosmetic changes and rare injury to internal organs.   She is enthusiastic about proceeding with treatment. A consent form has been signed and placed in her chart.  Will simulate after mammogram in 1-2 weeks.  A total of 5 medically necessary complex treatment devices will be fabricated and supervised by me: 4 fields with MLCs for custom blocks to protect heart, and lungs;  and, a Vac-lok. MORE COMPLEX DEVICES MAY BE MADE IN DOSIMETRY FOR FIELD IN FIELD BEAMS FOR DOSE HOMOGENEITY.  I have requested : 3D Simulation which is medically necessary to give adequate dose to at risk tissues while sparing lungs and heart.  I have requested a DVH of the following structures: lungs, heart, left lumpectomy cavity.    The patient will receive 50 Gy in 25 fractions to the left breast with 2 fields and 45 Gy in 25 fractions to regional nodes with 2 more fields.  This will be followed by a boost.  I spent 25 minutes face to face with the patient and more than 50% of that time was  spent in counseling and/or coordination of care. _____________________________________   Eppie Gibson, MD

## 2017-07-08 ENCOUNTER — Telehealth: Payer: Self-pay | Admitting: *Deleted

## 2017-07-08 ENCOUNTER — Ambulatory Visit: Payer: BLUE CROSS/BLUE SHIELD | Attending: Radiation Oncology | Admitting: Physical Therapy

## 2017-07-08 DIAGNOSIS — I890 Lymphedema, not elsewhere classified: Secondary | ICD-10-CM | POA: Diagnosis present

## 2017-07-08 DIAGNOSIS — M79602 Pain in left arm: Secondary | ICD-10-CM | POA: Diagnosis present

## 2017-07-08 DIAGNOSIS — M25612 Stiffness of left shoulder, not elsewhere classified: Secondary | ICD-10-CM | POA: Diagnosis not present

## 2017-07-08 NOTE — Therapy (Signed)
[place], [place], [address] Phone: [phone]   Fax:  [phone]  Physical Therapy Evaluation  Patient Details  Name: Shannon Logan MRN: [id number] Date of Birth: 1966-05-27 Referring Provider: Dr. Eppie Gibson  Encounter Date: 07/08/2017      PT End of Session - 07/08/17 1135    Visit Number 1   Number of Visits 9   Date for PT Re-Evaluation 08/22/17   PT Start Time 0938   PT Stop Time 1024   PT Time Calculation (min) 46 min   Activity Tolerance Patient tolerated treatment well   Behavior During Therapy [facility] for tasks assessed/performed      Past Medical History:  Diagnosis Date  . Breast cancer of lower-outer quadrant of left female breast ([place]) 01/03/2017  . Cancer ([place]) 11/22/2016   Breast   . Family history of breast cancer   . GERD (gastroesophageal reflux disease)     Past Surgical History:  Procedure Laterality Date  . BREAST LUMPECTOMY WITH AXILLARY LYMPH NODE DISSECTION Left 01/30/2017   Procedure: RE-EXCISION OF LEFT BREAST LUMPECTOMY AND LEFT AXILLARY LYMPH NODE DISSECTION;  Surgeon: Coralie Keens, MD;  Location: [place];  Service: General;  Laterality: Left;  . BREAST LUMPECTOMY WITH RADIOACTIVE SEED AND SENTINEL LYMPH NODE BIOPSY Left 01/22/2017   Procedure: BREAST LUMPECTOMY WITH RADIOACTIVE SEED AND SENTINEL LYMPH NODE BIOPSY;  Surgeon: Coralie Keens, MD;  Location: [place];  Service: General;  Laterality: Left;  . COLONOSCOPY N/A 10/26/2015   Procedure: COLONOSCOPY;  Surgeon: Rogene Houston, MD;  [facility];  Service: Endoscopy;  Laterality: N/A;  . DILATION AND CURETTAGE OF UTERUS    . ESOPHAGOGASTRODUODENOSCOPY N/A 10/26/2015   Procedure: ESOPHAGOGASTRODUODENOSCOPY (EGD);  Surgeon: Rogene Houston, MD;  [facility];  Service: Endoscopy;  Laterality: N/A;  1:00  . PORTACATH PLACEMENT Right 01/22/2017   Procedure: INSERTION PORT-A-CATH;  Surgeon: Coralie Keens, MD;  Location: [place];  Service: General;  Laterality: Right;  . URETHRAL DILATION      There were no vitals filed for this visit.       Subjective Assessment - 07/08/17 0941    Subjective I started having pain in the left forearm.  I thought it was coming from the surgery, but it felt like it was pulling in here. During chemo, the PA said I had some lymphedema.  I got sleeve.  When I do things with the arm, the hand will swell even with the  sleeve on. Also getting swelling in her feet and has to keep her feet elevated. Wears the compression sleeve 6-8 hours a day.   Pertinent History Left breast cancer diagnosed 11/22/16. Had two lumpectomies, one 01/22/17 (2 nodes removed, one positive) and then 01/30/17 to get clear margins (possibly 5 nodes removed). Started chemo 02/17/17 and finished it 06/30/17. Will have XRT starting in 2-3 weeks for 6 weeks.   Patient Stated Goals to try to manage left arm swelling   Currently in Pain? Yes   Pain Score 1    Pain Location Arm   Pain Orientation Left;Distal;Anterior   Pain Descriptors / Indicators Other (Comment)  pulling, like something's too tight   Pain Type Other (Comment)   Aggravating Factors  not wearing the compression sleeve   Pain Relieving Factors wearing the compression sleeve            [facility] PT Assessment - 07/08/17 0001  Assessment   Medical Diagnosis left breast cancer   Referring Provider Dr. Eppie Gibson   Onset Date/Surgical Date 01/22/17  and 01/30/17   Hand Dominance Right   Prior Therapy none now; seen at [facility] cancer clinic at diagnosis     Precautions   Precautions Other (comment)   Precaution Comments cancer precautions     Restrictions   Weight Bearing Restrictions No     Balance Screen   Has the patient fallen in the past 6 months No   Has the patient had a decrease in activity level because of a fear of falling?  No   Is the  patient reluctant to leave their home because of a fear of falling?  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  Used to walk 3 miles a day most days of the week.     Cognition   Overall Cognitive Status Within Functional Limits for tasks assessed     Observation/Other Assessments   Observations left lateral breast incision is well-healed as is lymph node biopsy site   Skin Integrity intact, healed   Other Surveys  --  lymph life impact scale score of 12 = 18% impairment   Quick DASH  15.91     ROM / Strength   AROM / PROM / Strength AROM     AROM   AROM Assessment Site Shoulder   Right/Left Shoulder Right;Left   Right Shoulder Flexion 154 Degrees   Right Shoulder ABduction 170 Degrees   Right Shoulder Internal Rotation --  WFL in sitting   Right Shoulder External Rotation 85 Degrees  in sitting   Left Shoulder Flexion 145 Degrees   Left Shoulder ABduction 180 Degrees   Left Shoulder Internal Rotation 62 Degrees  in sitting   Left Shoulder External Rotation 91 Degrees     Palpation   Palpation comment visible and palpable cording at antecubital fossa area     Ambulation/Gait   Ambulation/Gait Yes   Ambulation/Gait Assistance 7: Independent           LYMPHEDEMA/ONCOLOGY QUESTIONNAIRE - 07/08/17 1004      What other symptoms do you have   Are you having pitting edema Yes  mild   Body Site left forearm   [place] Sign No     Lymphedema Assessments   Lymphedema Assessments Upper extremities     Right Upper Extremity Lymphedema   15 cm Proximal to  Olecranon Process 38.7 cm   10 cm Proximal to Olecranon Process 37.5 cm   Olecranon Process 29.8 cm   15 cm Proximal to Ulnar Styloid Process 29.6 cm   10 cm Proximal to Ulnar Styloid Process 26.3 cm   Just Proximal to Ulnar Styloid Process 17.5 cm   Across Hand at [employer] 20.2 cm   At [place] of 2nd Digit 6.8 cm     Left Upper Extremity Lymphedema   15 cm Proximal to Olecranon Process 39 cm   10 cm Proximal to Olecranon Process 37.8 cm  Olecranon Process 30.3 cm   15 cm Proximal to Ulnar Styloid Process 30 cm   10 cm Proximal to Ulnar Styloid Process 26.7 cm   Just Proximal to Ulnar Styloid Process 17.7 cm   Across Hand at [employer] 19.3 cm   At [place] of 2nd Digit 6.6 cm           Quick Dash - 07/08/17 0001    Open a tight or new jar Moderate difficulty   Do heavy household chores (wash walls, wash floors) Mild difficulty   Carry a shopping bag or briefcase No difficulty   Wash your back No difficulty   Use a knife to cut food No difficulty   Recreational activities in which you take some force or impact through your arm, shoulder, or hand (golf, hammering, tennis) Mild difficulty   During the past week, to what extent has your arm, shoulder or hand problem interfered with your normal social activities with family, friends, neighbors, or groups? Not at all   During the past week, to what extent has your arm, shoulder or hand problem limited your work or other regular daily activities Slightly   Arm, shoulder, or hand pain. Mild   Tingling (pins and needles) in your arm, shoulder, or hand Mild   Difficulty Sleeping No difficulty   DASH Score 15.91 %      Objective measurements completed on examination: See above findings.                          [facility] Goals - 07/08/17 1144      CC Long Term Goal  #1   Title Pt. will be independent in HEP for left shoulder and UE stretching to release cording   Time 2   Period Weeks   Status  New   Target Date 07/25/17     CC Long Term Goal  #2   Title Pt. will report at least 60% decreasein feeling of pulling or tightness when she reaches up or out with the left arm.   Time 2   Period Weeks   Status New   Target Date 07/25/17     CC Long Term Goal  #3   Title Pt. will be independent in self-manual lymph drainage if appropriate   Time 2   Period Weeks   Status New     CC Long Term Goal  #4   Title Pt. will be knowledgeable about how and where to obtain a new compression sleeve and possible glove if appropriate   Time 2   Period Weeks   Status New             Plan - 07/08/17 1135    Clinical Impression Statement This is a pleasant woman s/p lumpectomy x 2 in February and just having finished chemotherapy with c/o discomfort in left forearm. She was told she has lymphedema, but she described a pulling feeling in the forearm, and there is visible and palpable cording at the elbow.  She shows little difference in her circumference measurements between left and right arms today.  She has a compression sleeve that does reduce her discomfort.  Her compression sleeve appears to fit well but it is a 15-20 mmHg compression, which may not be adequate, and she reports discomfort at the elbow when she tried to do work tasks with it on.   Clinical Presentation Evolving   Clinical Presentation due to: just finished  chemo and will start XRT soon   Clinical Decision Making Low   Rehab Potential Excellent   PT Frequency 2x / week   PT Duration 2 weeks  to 4 weeks prn   PT Treatment/Interventions ADLs/Self Care Home Management;DME Instruction;Therapeutic exercise;Patient/family education;Orthotic Fit/Training;Manual techniques;Manual lymph drainage;Passive range of motion;Taping   PT Next Visit Plan Begin soft tissue mobilization, myofascial release, and P/ROM for cording in left UE.  Show HEP for stretching left UE (suggest standing dowel stretches). 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   PT Home Exercise Plan continue stretches she was given earlier   Consulted and Agree with Plan of Care Patient      Patient will benefit from skilled therapeutic intervention in order to improve the following deficits and impairments:  Decreased range of motion, Increased fascial restricitons, Decreased knowledge of precautions, Decreased knowledge of use of DME  Visit Diagnosis: Stiffness of left shoulder, not elsewhere classified - Plan: PT plan of care cert/re-cert  Pain in left arm - Plan: PT plan of care cert/re-cert  Lymphedema, not elsewhere classified - Plan: PT plan of care cert/re-cert     Problem List Patient Active Problem List   Diagnosis Date Noted  . Genetic testing 01/20/2017  . Breast cancer of lower-outer quadrant of left female breast ([place]) 01/03/2017  . Family history of breast cancer   . Prediabetes 10/10/2016  . Hypertriglyceridemia 04/05/2016  . BMI 32.0-32.9,adult 09/22/2015  . Abdominal pain, right upper quadrant 07/11/2015    SALISBURY,DONNA 07/08/2017, 11:49 AM  [place], [place], [address] Phone: [phone]   Fax:  [phone]  Name: Shannon Logan MRN: [id number] Date of Birth: 30-Mar-1966  Serafina Royals, PT 07/08/17 11:49 AM

## 2017-07-08 NOTE — Telephone Encounter (Signed)
CALLED PATIENT TO INFORM OF ENT APPT. WITH DR. Constance Holster ON 08-05-17 - ARRIVAL TIME - 12:45 PM, PATIENT DECLINED THIS APPT., SHE WANTS TO SEE DR. Erik Obey, THEREFORE APPT. IS WITH DR. Erik Obey ON 08-20-75 - ARRIVAL TIME - 2:45 PM, PT. AGREED TO THIS APPT.

## 2017-07-08 NOTE — Patient Instructions (Signed)

## 2017-07-09 ENCOUNTER — Ambulatory Visit: Payer: BLUE CROSS/BLUE SHIELD | Admitting: Physical Therapy

## 2017-07-09 DIAGNOSIS — I890 Lymphedema, not elsewhere classified: Secondary | ICD-10-CM

## 2017-07-09 DIAGNOSIS — M25612 Stiffness of left shoulder, not elsewhere classified: Secondary | ICD-10-CM | POA: Diagnosis not present

## 2017-07-09 DIAGNOSIS — M79602 Pain in left arm: Secondary | ICD-10-CM

## 2017-07-09 NOTE — Therapy (Signed)
[place], [place], [address] Phone: [phone]   Fax:  [phone]  Physical Therapy Treatment  Patient Details  Name: Shannon Logan MRN: [id number] Date of Birth: 1966/02/12 Referring Provider: Dr. Eppie Gibson  Encounter Date: 07/09/2017      PT End of Session - 07/09/17 1657    Visit Number 2   Number of Visits 9   Date for PT Re-Evaluation 08/22/17   PT Start Time 1602   PT Stop Time 1650   PT Time Calculation (min) 48 min   Activity Tolerance Patient tolerated treatment well   Behavior During Therapy [facility] for tasks assessed/performed      Past Medical History:  Diagnosis Date  . Breast cancer of lower-outer quadrant of left female breast ([place]) 01/03/2017  . Cancer ([place]) 11/22/2016   Breast   . Family history of breast cancer   . GERD (gastroesophageal reflux disease)     Past Surgical History:  Procedure Laterality Date  . BREAST LUMPECTOMY WITH AXILLARY LYMPH NODE DISSECTION Left 01/30/2017   Procedure: RE-EXCISION OF LEFT BREAST LUMPECTOMY AND LEFT AXILLARY LYMPH NODE DISSECTION;  Surgeon: Coralie Keens, MD;  Location: [place];  Service: General;  Laterality: Left;  . BREAST LUMPECTOMY WITH RADIOACTIVE SEED AND SENTINEL LYMPH NODE BIOPSY Left 01/22/2017   Procedure: BREAST LUMPECTOMY WITH RADIOACTIVE SEED AND SENTINEL LYMPH NODE BIOPSY;  Surgeon: Coralie Keens, MD;  Location: [place];  Service: General;  Laterality: Left;  . COLONOSCOPY N/A 10/26/2015   Procedure: COLONOSCOPY;  Surgeon: Rogene Houston, MD;  [facility];  Service: Endoscopy;  Laterality: N/A;  . DILATION AND CURETTAGE OF UTERUS    . ESOPHAGOGASTRODUODENOSCOPY N/A 10/26/2015   Procedure: ESOPHAGOGASTRODUODENOSCOPY (EGD);  Surgeon: Rogene Houston, MD;  [facility];  Service: Endoscopy;  Laterality: N/A;  1:00  . PORTACATH PLACEMENT Right 01/22/2017    Procedure: INSERTION PORT-A-CATH;  Surgeon: Coralie Keens, MD;  Location: [facility];  Service: General;  Laterality: Right;  . URETHRAL DILATION      There were no vitals filed for this visit.      Subjective Assessment - 07/09/17 1604    Subjective Nothing new.  Felt okay after evaluation.   Currently in Pain? Yes   Pain Score 1    Pain Location Arm   Pain Orientation Left;Anterior;Distal   Pain Descriptors / Indicators Other (Comment)  pulling   Aggravating Factors  not having sleeve on   Pain Relieving Factors wearing sleeve               LYMPHEDEMA/ONCOLOGY QUESTIONNAIRE - 07/08/17 1004      What other symptoms do you have   Are you having pitting edema Yes  mild   Body Site left forearm   Stemmer Sign No     Lymphedema Assessments   Lymphedema Assessments Upper extremities     Right Upper Extremity Lymphedema   15 cm Proximal to Olecranon Process 38.7 cm   10 cm Proximal to Olecranon Process 37.5 cm   Olecranon Process 29.8 cm   15 cm Proximal to Ulnar Styloid Process 29.6 cm   10 cm Proximal to Ulnar Styloid Process 26.3 cm   Just Proximal to Ulnar Styloid Process 17.5 cm   Across Hand at [employer] 20.2 cm   At [place] of 2nd Digit 6.8 cm     Left Upper Extremity Lymphedema   15 cm Proximal to  Olecranon Process 39 cm   10 cm Proximal to Olecranon Process 37.8 cm   Olecranon Process 30.3 cm   15 cm Proximal to Ulnar Styloid Process 30 cm   10 cm Proximal to Ulnar Styloid Process 26.7 cm   Just Proximal to Ulnar Styloid Process 17.7 cm   Across Hand at [employer] 19.3 cm   At [place] of 2nd Digit 6.6 cm                  [facility] Adult PT Treatment/Exercise - 07/09/17 0001      Exercises   Exercises Shoulder     Shoulder Exercises: Stretch   Other Shoulder Stretches dowel stretches for left shoulder flexion and abduction, 5 counts x 5   Other Shoulder Stretches doorway stretch with hands elevated on door jamb, neural  tension stretch with left hand against wall     Manual Therapy   Manual Therapy Passive ROM;Neural Stretch;Myofascial release;Soft tissue mobilization   Soft tissue mobilization worked on stretching, trying to release cording at left elbow/upper forearm area and left axilla with shoulder toward end range flexion and then abduction in supine and right sidelying   Myofascial Release distraction on left upper arm with concurrent distraction at left abdomen in opposite directions, that in supine and then in right sidelying; unidirectional at left axilla area that draws in   Passive ROM In supine, left shoulder er and flexion with stretch to patient tolerance; also horizontal abduction   Neural Stretch to left UE in supine                PT Education - 07/09/17 1656    Education provided Yes   Education Details dowel and doorway stretches, left UE neural tension stretch   Person(s) Educated Patient   Methods Explanation;Demonstration;Verbal cues;Handout   Comprehension Verbalized understanding;Returned demonstration                Long Term Clinic Goals - 07/08/17 1144      CC Long Term Goal  #1   Title Pt. will be independent in HEP for left shoulder and UE stretching to release cording   Time 2   Period Weeks   Status New   Target Date 07/25/17     CC Long Term Goal  #2   Title Pt. will report at least 60% decreasein feeling of pulling or tightness when she reaches up or out with the left arm.   Time 2   Period Weeks   Status New   Target Date 07/25/17     CC Long Term Goal  #3   Title Pt. will be independent in self-manual lymph drainage if appropriate   Time 2   Period Weeks   Status New     CC Long Term Goal  #4   Title Pt. will be knowledgeable about how and where to obtain a new compression sleeve and possible glove if appropriate   Time 2   Period Weeks   Status New            Plan - 07/09/17 1657    Clinical Impression Statement Pt. tolerated  vigorous stretching quite well.  It was difficult to get cording to release and I did not feel "popping" with soft tissue mobilization today, though it was harder to identify distinct cording by the end of the session. Pt. did feel like she was a little looser at the forearm at end of session.   Rehab Potential Excellent  PT Frequency 2x / week   PT Duration 2 weeks  to 4 weeks prn   PT Treatment/Interventions ADLs/Self Care Home Management;DME Instruction;Therapeutic exercise;Patient/family education;Orthotic Fit/Training;Manual techniques;Manual lymph drainage;Passive range of motion;Taping   PT Next Visit Plan Continue soft tissue mobilization, myofascial release, and P/ROM for cording in left UE.  Review/progress HEP for stretching left UE. 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   PT Home Exercise Plan dowel, doorway, and neural tension stretches   Consulted and Agree with Plan of Care Patient      Patient will benefit from skilled therapeutic intervention in order to improve the following deficits and impairments:  Decreased range of motion, Increased fascial restricitons, Decreased knowledge of precautions, Decreased knowledge of use of DME  Visit Diagnosis: Stiffness of left shoulder, not elsewhere classified  Pain in left arm  Lymphedema, not elsewhere classified     Problem List Patient Active Problem List   Diagnosis Date Noted  . Genetic testing 01/20/2017  . Breast cancer of lower-outer quadrant of left female breast ([place]) 01/03/2017  . Family history of breast cancer   . Prediabetes 10/10/2016  . Hypertriglyceridemia 04/05/2016  . BMI 32.0-32.9,adult 09/22/2015  . Abdominal pain, right upper quadrant 07/11/2015     Nasya Vincent 07/09/2017, 5:01 PM  [place], [place], [address] Phone: [phone]   Fax:  [phone]  Name: LEQUITA MEADOWCROFT MRN: [id number] Date of Birth: [birth year]  Serafina Royals, PT 07/09/17 5:01 PM

## 2017-07-09 NOTE — Patient Instructions (Signed)
Flexion (Eccentric) - Active-Assist (Cane)          Cancer Rehab [phone]    Use unaffected arm to push affected arm forward. Avoid hiking shoulder (shoulder should NOT touch cheek). Keep palm relaxed. Slowly lower affected arm. Hold stretch for _5_ seconds repeating _5-10_ times, _1-2_ times a day.  Abduction (Eccentric) - Active-Assist (Cane)    Use unaffected arm to push affected arm out to side. Avoid hiking shoulder (shoulder should NOT touch cheek). Keep palm relaxed. Slowly lower affected arm. Hold stretch _5_ seconds repeating _5-10_ times, _1-2_ times a day.   CHEST: Doorway, Bilateral - Standing    Standing in doorway, place hands on wall with elbows bent at shoulder height (OKAY TO DO THIS HIGHER FOR MORE STRETCH) and place one foot in front of other. Shift weight onto front foot. Hold _10-20__ seconds. Do _3-5_ times, _1-2_ times a day.    Stand with left side toward a wall, about arms length from the wall.  Place your left hand flat on the wall with your fingers pointing up OR back.  Oscillate between having your elbow straight and having it slightly bent.  You should feel a stretch down the arm when the elbow is straight. Do 30 quick oscillations, one or two sets.

## 2017-07-11 ENCOUNTER — Ambulatory Visit
Admission: RE | Admit: 2017-07-11 | Discharge: 2017-07-11 | Disposition: A | Discharge status: Home / Self Care | Payer: BLUE CROSS/BLUE SHIELD | Source: Ambulatory Visit | Attending: Radiation Oncology | Admitting: Radiation Oncology

## 2017-07-11 DIAGNOSIS — C50512 Malignant neoplasm of lower-outer quadrant of left female breast: Secondary | ICD-10-CM

## 2017-07-11 DIAGNOSIS — Z171 Estrogen receptor negative status [ER-]: Principal | ICD-10-CM

## 2017-07-11 HISTORY — DX: Personal history of antineoplastic chemotherapy: Z92.21

## 2017-07-16 ENCOUNTER — Ambulatory Visit: Payer: BLUE CROSS/BLUE SHIELD | Attending: Radiation Oncology | Admitting: Physical Therapy

## 2017-07-16 DIAGNOSIS — M25612 Stiffness of left shoulder, not elsewhere classified: Secondary | ICD-10-CM | POA: Diagnosis not present

## 2017-07-16 DIAGNOSIS — I890 Lymphedema, not elsewhere classified: Secondary | ICD-10-CM | POA: Diagnosis present

## 2017-07-16 DIAGNOSIS — M79602 Pain in left arm: Secondary | ICD-10-CM | POA: Diagnosis present

## 2017-07-16 NOTE — Patient Instructions (Signed)

## 2017-07-16 NOTE — Therapy (Signed)
[place], [place], [address] Phone: [phone]   Fax:  [phone]  Physical Therapy Treatment  Patient Details  Name: Shannon Logan MRN: [id number] Date of Birth: 20-Sep-1966 Referring Provider: Dr. Eppie Gibson  Encounter Date: 07/16/2017      PT End of Session - 07/16/17 1157    Visit Number 3   Number of Visits 9   Date for PT Re-Evaluation 08/22/17   PT Start Time 1105   PT Stop Time 1145   PT Time Calculation (min) 40 min   Activity Tolerance Patient tolerated treatment well;Patient limited by pain   Behavior During Therapy [facility] for tasks assessed/performed      Past Medical History:  Diagnosis Date  . Breast cancer of lower-outer quadrant of left female breast ([place]) 01/03/2017  . Cancer ([place]) 11/22/2016   Breast   . Family history of breast cancer   . GERD (gastroesophageal reflux disease)   . Personal history of chemotherapy    from 3/18 till 7/18    Past Surgical History:  Procedure Laterality Date  . BREAST LUMPECTOMY Left 01/22/17 and 01/30/17  . BREAST LUMPECTOMY WITH AXILLARY LYMPH NODE DISSECTION Left 01/30/2017   Procedure: RE-EXCISION OF LEFT BREAST LUMPECTOMY AND LEFT AXILLARY LYMPH NODE DISSECTION;  Surgeon: Coralie Keens, MD;  Location: [place];  Service: General;  Laterality: Left;  . BREAST LUMPECTOMY WITH RADIOACTIVE SEED AND SENTINEL LYMPH NODE BIOPSY Left 01/22/2017   Procedure: BREAST LUMPECTOMY WITH RADIOACTIVE SEED AND SENTINEL LYMPH NODE BIOPSY;  Surgeon: Coralie Keens, MD;  Location: [place];  Service: General;  Laterality: Left;  . COLONOSCOPY N/A 10/26/2015   Procedure: COLONOSCOPY;  Surgeon: Rogene Houston, MD;  [facility];  Service: Endoscopy;  Laterality: N/A;  . DILATION AND CURETTAGE OF UTERUS    . ESOPHAGOGASTRODUODENOSCOPY N/A 10/26/2015   Procedure: ESOPHAGOGASTRODUODENOSCOPY (EGD);  Surgeon: Rogene Houston, MD;  [facility];  Service: Endoscopy;  Laterality: N/A;  1:00  . PORTACATH PLACEMENT Right 01/22/2017   Procedure: INSERTION PORT-A-CATH;  Surgeon: Coralie Keens, MD;  Location: [facility];  Service: General;  Laterality: Right;  . URETHRAL DILATION      There were no vitals filed for this visit.      Subjective Assessment - 07/16/17 1126    Subjective Pt states she has not been able to wear her sleeve because her hand swells up. She goes Wednesday for her molds and scans for radiation. She reports she has had general body swelling from chemo and feels that it all is going down.   Pertinent History Left breast cancer diagnosed 11/22/16. Had two lumpectomies, one 01/22/17 (2 nodes removed, one positive) and then 01/30/17 to get clear margins (possibly 5 nodes removed). Started chemo 02/17/17 and finished it 06/30/17. Will have XRT starting in 2-3 weeks for 6 weeks.   Patient Stated Goals to try to manage left arm swelling   Currently in Pain? No/denies                         [facility] Adult PT Treatment/Exercise - 07/16/17 0001      Self-Care   Self-Care Other Self-Care Comments   Other Self-Care Comments  provided pt a donated craft glove for her to wear with her sleeve to keep her hand from swelling. Pt feels her arm is much smaller than it was before since her generalized swelling is  less so she will try the sleeve again      Shoulder Exercises: Supine   Other Supine Exercises dowel rod exercises for abduction and flexion      Shoulder Exercises: Sidelying   ABduction AROM;Left;10 reps   Other Sidelying Exercises 5 small circles in each direction      Manual Therapy   Manual Therapy Passive ROM;Neural Stretch;Myofascial release;Soft tissue mobilization   Soft tissue mobilization worked on stretching, trying to release cording at left elbow/upper forearm area and left axilla with shoulder toward end range flexion and then abduction in  supine and right sidelying   Passive ROM In supine, left shoulder er and flexion with stretch to patient tolerance; also horizontal abduction   Neural Stretch to left UE in supine                        Long Term Clinic Goals - 07/08/17 1144      CC Long Term Goal  #1   Title Pt. will be independent in HEP for left shoulder and UE stretching to release cording   Time 2   Period Weeks   Status New   Target Date 07/25/17     CC Long Term Goal  #2   Title Pt. will report at least 60% decreasein feeling of pulling or tightness when she reaches up or out with the left arm.   Time 2   Period Weeks   Status New   Target Date 07/25/17     CC Long Term Goal  #3   Title Pt. will be independent in self-manual lymph drainage if appropriate   Time 2   Period Weeks   Status New     CC Long Term Goal  #4   Title Pt. will be knowledgeable about how and where to obtain a new compression sleeve and possible glove if appropriate   Time 2   Period Weeks   Status New            Plan - 07/16/17 1158    Clinical Impression Statement Pt reports that cording is improving and fells does not fell the pulling from the cording as much.  She will try to wear her compression sleeve with her glove again to see if she has less hand swelling, but will not wear it if she still has hand swelling. She has adequate range of motion for radiation treatment.  She is ready to progress to strengthening program    Clinical Impairments Affecting Rehab Potential previous chemo with resolving, but present, neuropathy symptoms   PT Frequency 2x / week   PT Duration 2 weeks  to 4 weeks prn    PT Next Visit Plan Teach supine scapular series, and progress to Strength ABC Program.  check to see if pt is able to use her current compression sleeve Continue soft tissue mobilization, myofascial release, and P/ROM for cording in left UE.  Review/progress HEP for stretching left UE. 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   Consulted and Agree with Plan of Care Patient      Patient will benefit from skilled therapeutic intervention in order to improve the following deficits and impairments:  Decreased range of motion, Increased fascial  restricitons, Decreased knowledge of precautions, Decreased knowledge of use of DME  Visit Diagnosis: Stiffness of left shoulder, not elsewhere classified  Pain in left arm  Lymphedema, not elsewhere classified     Problem List Patient Active Problem List   Diagnosis Date Noted  . Genetic testing 01/20/2017  . Breast cancer of lower-outer quadrant of left female breast ([place]) 01/03/2017  . Family history of breast cancer   . Prediabetes 10/10/2016  . Hypertriglyceridemia 04/05/2016  . BMI 32.0-32.9,adult 09/22/2015  . Abdominal pain, right upper quadrant 07/11/2015   Donato Heinz. Owens Shark PT  Norwood Levo 07/16/2017, 12:02 PM  [place], [place], [address] Phone: [phone]   Fax:  [phone]  Name: Shannon Logan MRN: [id number] Date of Birth: 09-17-1966

## 2017-07-22 ENCOUNTER — Ambulatory Visit: Payer: BLUE CROSS/BLUE SHIELD

## 2017-07-22 ENCOUNTER — Ambulatory Visit
Admission: RE | Admit: 2017-07-22 | Discharge: 2017-07-22 | Disposition: A | Discharge status: Home / Self Care | Payer: BLUE CROSS/BLUE SHIELD | Source: Ambulatory Visit | Attending: Radiation Oncology | Admitting: Radiation Oncology

## 2017-07-22 DIAGNOSIS — I890 Lymphedema, not elsewhere classified: Secondary | ICD-10-CM

## 2017-07-22 DIAGNOSIS — M25612 Stiffness of left shoulder, not elsewhere classified: Secondary | ICD-10-CM | POA: Diagnosis not present

## 2017-07-22 DIAGNOSIS — M79602 Pain in left arm: Secondary | ICD-10-CM

## 2017-07-22 DIAGNOSIS — C50512 Malignant neoplasm of lower-outer quadrant of left female breast: Secondary | ICD-10-CM

## 2017-07-22 DIAGNOSIS — Z510 Encounter for antineoplastic radiation therapy: Secondary | ICD-10-CM | POA: Diagnosis not present

## 2017-07-22 DIAGNOSIS — Z171 Estrogen receptor negative status [ER-]: Principal | ICD-10-CM

## 2017-07-22 NOTE — Progress Notes (Signed)
Radiation Oncology         [phone] ________________________________  Name: Shannon Logan MRN: [id number]  Date: 07/22/2017  DOB: 10-07-1966  SIMULATION AND TREATMENT PLANNING NOTE  // Special treatment procedure  Outpatient  DIAGNOSIS:     ICD-10-CM   1. Malignant neoplasm of lower-outer quadrant of left breast of female, estrogen receptor negative ([place]) C50.512    Z17.1     NARRATIVE:  The patient was brought to the [facility].  Identity was confirmed.  All relevant records and images related to the planned course of therapy were reviewed.  The patient freely provided informed written consent to proceed with treatment after reviewing the details related to the planned course of therapy. The consent form was witnessed and verified by the simulation staff.    Then, the patient was set-up in a stable reproducible supine position for radiation therapy with her ipsilateral arm over her head, and her upper body secured in a custom-made Vac-lok device.  CT images were obtained.  Surface markings were placed.  The CT images were loaded into the planning software.    Special treatment procedure:  Special treatment procedure was performed today due to the extra time and effort required by myself to plan and prepare this patient for deep inspiration breath hold technique.  I have determined cardiac sparing to be of benefit to this patient to prevent long term cardiac damage due to radiation of the heart.  Bellows were placed on the patient's abdomen. To facilitate cardiac sparing, the patient was coached by the radiation therapists on breath hold techniques and breathing practice was performed. Practice waveforms were obtained. The patient was then scanned while maintaining breath hold in the treatment position.  This image was then transferred over to the imaging specialist. The imaging specialist then created a fusion of the free breathing and breath hold scans using the chest  wall as the stable structure. I personally reviewed the fusion in axial, coronal and sagittal image planes.  Excellent cardiac sparing was obtained.  I felt the patient is an appropriate candidate for breath hold and the patient will be treated as such.  The image fusion was then reviewed with the patient to reinforce the necessity of reproducible breath hold.   TREATMENT PLANNING NOTE: Treatment planning then occurred.  The radiation prescription was entered and confirmed.     A total of 5 medically necessary complex treatment devices were fabricated and supervised by me: 4 fields with MLCs for custom blocks to protect heart, and lungs;  and, a Vac-lok. MORE COMPLEX DEVICES MAY BE MADE IN DOSIMETRY FOR FIELD IN FIELD BEAMS FOR DOSE HOMOGENEITY.  I have requested : 3D Simulation which is medically necessary to give adequate dose to at risk tissues while sparing lungs and heart.  I have requested a DVH of the following structures: lungs, heart, left lumpectomy.    The patient will receive 50 Gy in 25 fractions to the left breast with 2 tangential fields.  Left SCV and axillary nodes will be treated with 2 additional fields to 45 Gy in 25 fractions. This will be followed by a breast boost.  Optical Surface Tracking Plan:  Since intensity modulated radiotherapy (IMRT) and 3D conformal radiation treatment methods are predicated on accurate and precise positioning for treatment, intrafraction motion monitoring is medically necessary to ensure accurate and safe treatment delivery. The ability to quantify intrafraction motion without excessive ionizing radiation dose can only be performed with optical surface tracking. Accordingly,  surface imaging offers the opportunity to obtain 3D measurements of patient position throughout IMRT and 3D treatments without excessive radiation exposure. I am ordering optical surface tracking for this patient's upcoming course of radiotherapy.  ________________________________    Reference:  Ursula Alert, J, et al. Surface imaging-based analysis of intrafraction motion for breast radiotherapy patients.Journal of [place], n. 6, nov. 2014. ISSN [id number].  Available at: <http://www.jacmp.org/index.php/jacmp/article/view/4957>.    -----------------------------------  Eppie Gibson, MD

## 2017-07-22 NOTE — Patient Instructions (Signed)

## 2017-07-22 NOTE — Therapy (Signed)
[place], [place], [address] Phone: [phone]   Fax:  [phone]  Physical Therapy Treatment  Patient Details  Name: Shannon Logan MRN: [id number] Date of Birth: [birth year] Referring Provider: Dr. Eppie Gibson  Encounter Date: 07/22/2017      PT End of Session - 07/22/17 0956    Visit Number 4   Number of Visits 9   Date for PT Re-Evaluation 08/22/17   PT Start Time 0939   PT Stop Time 1022   PT Time Calculation (min) 43 min   Activity Tolerance Patient tolerated treatment well   Behavior During Therapy [facility] for tasks assessed/performed      Past Medical History:  Diagnosis Date  . Breast cancer of lower-outer quadrant of left female breast ([place]) 01/03/2017  . Cancer ([place]) 11/22/2016   Breast   . Family history of breast cancer   . GERD (gastroesophageal reflux disease)   . Personal history of chemotherapy    from 3/18 till 7/18    Past Surgical History:  Procedure Laterality Date  . BREAST LUMPECTOMY Left 01/22/17 and 01/30/17  . BREAST LUMPECTOMY WITH AXILLARY LYMPH NODE DISSECTION Left 01/30/2017   Procedure: RE-EXCISION OF LEFT BREAST LUMPECTOMY AND LEFT AXILLARY LYMPH NODE DISSECTION;  Surgeon: Coralie Keens, MD;  Location: [place];  Service: General;  Laterality: Left;  . BREAST LUMPECTOMY WITH RADIOACTIVE SEED AND SENTINEL LYMPH NODE BIOPSY Left 01/22/2017   Procedure: BREAST LUMPECTOMY WITH RADIOACTIVE SEED AND SENTINEL LYMPH NODE BIOPSY;  Surgeon: Coralie Keens, MD;  Location: [place];  Service: General;  Laterality: Left;  . COLONOSCOPY N/A 10/26/2015   Procedure: COLONOSCOPY;  Surgeon: Rogene Houston, MD;  [facility];  Service: Endoscopy;  Laterality: N/A;  . DILATION AND CURETTAGE OF UTERUS    . ESOPHAGOGASTRODUODENOSCOPY N/A 10/26/2015   Procedure: ESOPHAGOGASTRODUODENOSCOPY (EGD);  Surgeon: Rogene Houston, MD;  [facility];  Service: Endoscopy;  Laterality: N/A;  1:00  . PORTACATH PLACEMENT Right 01/22/2017   Procedure: INSERTION PORT-A-CATH;  Surgeon: Coralie Keens, MD;  Location: [place];  Service: General;  Laterality: Right;  . URETHRAL DILATION      There were no vitals filed for this visit.      Subjective Assessment - 07/22/17 0941    Subjective I've been wearing my compression sleeve for the past week and it's been great, my hand hasn't swelled up with it. My swelling overall has been so much better in the past week, in fact I've lost 12 lbs of fluid!   Pertinent History Left breast cancer diagnosed 11/22/16. Had two lumpectomies, one 01/22/17 (2 nodes removed, one positive) and then 01/30/17 to get clear margins (possibly 5 nodes removed). Started chemo 02/17/17 and finished it 06/30/17. Will have XRT starting in 2-3 weeks for 6 weeks.   Patient Stated Goals to try to manage left arm swelling   Currently in Pain? No/denies                         [facility] Adult PT Treatment/Exercise - 07/22/17 0001      Shoulder Exercises: Supine   Horizontal ABduction Strengthening;Both;10 reps;Theraband   Theraband Level (Shoulder Horizontal ABduction) Level 1 (Yellow)   External Rotation Strengthening;Both;10 reps;Theraband   Theraband Level (Shoulder External Rotation) Level 1 (Yellow)   Flexion Strengthening;Both;10 reps;Theraband  Narrow and Wide Grip, 10 times each   Theraband Level (Shoulder Flexion)  Level 1 (Yellow)   Other Supine Exercises Bil D2 with yellow theraband returning correct therapist demonstration, 5 times each side     Manual Therapy   Manual Therapy Passive ROM;Neural Stretch;Myofascial release;Soft tissue mobilization   Soft tissue mobilization worked on stretching, trying to release cording at left elbow/upper forearm area and left axilla with shoulder toward end range flexion and then abduction in supine and right sidelying   Passive ROM In supine,  left shoulder er and flexion with stretch to patient tolerance; also horizontal abduction   Neural Stretch to left UE in supine                PT Education - 07/22/17 0947    Education provided Yes   Education Details supine scapular series withyellow theraband   Person(s) Educated Patient   Methods Explanation;Demonstration;Handout   Comprehension Verbalized understanding;Returned demonstration                [facility] Goals - 07/08/17 1144      CC Long Term Goal  #1   Title Pt. will be independent in HEP for left shoulder and UE stretching to release cording   Time 2   Period Weeks   Status New   Target Date 07/25/17     CC Long Term Goal  #2   Title Pt. will report at least 60% decreasein feeling of pulling or tightness when she reaches up or out with the left arm.   Time 2   Period Weeks   Status New   Target Date 07/25/17     CC Long Term Goal  #3   Title Pt. will be independent in self-manual lymph drainage if appropriate   Time 2   Period Weeks   Status New     CC Long Term Goal  #4   Title Pt. will be knowledgeable about how and where to obtain a new compression sleeve and possible glove if appropriate   Time 2   Period Weeks   Status New            Plan - 07/22/17 5784    Clinical Impression Statement Pt wearing her compression sleeve which she reports fits great now that her overall swelling has reduced. Progressed her HEP to include supine scapular series today and she tolerated this very well. Continued with manual therapy to cording to try to get releases.    Rehab Potential Excellent   Clinical Impairments Affecting Rehab Potential previous chemo with resolving, but present, neuropathy symptoms; potentially starting radiation 07/28/17    PT Frequency 2x / week   PT Duration 2 weeks  to 4 weeks prn   PT Treatment/Interventions ADLs/Self Care Home Management;DME Instruction;Therapeutic exercise;Patient/family education;Orthotic  Fit/Training;Manual techniques;Manual lymph drainage;Passive range of motion;Taping   PT Next Visit Plan Review supine scapular series and progress to Strength ABC Program. Cont manual therapy as time allows to cording.   Consulted and Agree with Plan of Care Patient      Patient will benefit from skilled therapeutic intervention in order to improve the following deficits and impairments:  Decreased range of motion, Increased fascial restricitons, Decreased knowledge of precautions, Decreased knowledge of use of DME  Visit Diagnosis: Stiffness of left shoulder, not elsewhere classified  Pain in left arm  Lymphedema, not elsewhere classified     Problem List Patient Active Problem List   Diagnosis Date Noted  . Genetic testing 01/20/2017  . Breast cancer of lower-outer quadrant of left female breast (  [place]) 01/03/2017  . Family history of breast cancer   . Prediabetes 10/10/2016  . Hypertriglyceridemia 04/05/2016  . BMI 32.0-32.9,adult 09/22/2015  . Abdominal pain, right upper quadrant 07/11/2015    Otelia Limes, PTA 07/22/2017, 10:25 AM  [place], [place], [address] Phone: [phone]   Fax:  [phone]  Name: DANIELL PARADISE MRN: [id number] Date of Birth: 08-29-1966

## 2017-07-23 DIAGNOSIS — Z510 Encounter for antineoplastic radiation therapy: Secondary | ICD-10-CM | POA: Diagnosis not present

## 2017-07-25 ENCOUNTER — Ambulatory Visit: Payer: BLUE CROSS/BLUE SHIELD | Admitting: Physical Therapy

## 2017-07-25 DIAGNOSIS — M25612 Stiffness of left shoulder, not elsewhere classified: Secondary | ICD-10-CM | POA: Diagnosis not present

## 2017-07-25 DIAGNOSIS — I890 Lymphedema, not elsewhere classified: Secondary | ICD-10-CM

## 2017-07-25 DIAGNOSIS — M79602 Pain in left arm: Secondary | ICD-10-CM

## 2017-07-25 NOTE — Therapy (Signed)
[place], [place], [address] Phone: [phone]   Fax:  [phone]  Physical Therapy Treatment  Patient Details  Name: Shannon Logan MRN: [id number] Date of Birth: 02/20/1966 Referring Provider: Dr. Eppie Gibson  Encounter Date: 07/25/2017      PT End of Session - 07/25/17 1236    Visit Number 5   Number of Visits 9   Date for PT Re-Evaluation 08/22/17   PT Start Time 0945   PT Stop Time 1031   PT Time Calculation (min) 46 min   Activity Tolerance Patient tolerated treatment well   Behavior During Therapy [facility] for tasks assessed/performed      Past Medical History:  Diagnosis Date  . Breast cancer of lower-outer quadrant of left female breast ([place]) 01/03/2017  . Cancer ([place]) 11/22/2016   Breast   . Family history of breast cancer   . GERD (gastroesophageal reflux disease)   . Personal history of chemotherapy    from 3/18 till 7/18    Past Surgical History:  Procedure Laterality Date  . BREAST LUMPECTOMY Left 01/22/17 and 01/30/17  . BREAST LUMPECTOMY WITH AXILLARY LYMPH NODE DISSECTION Left 01/30/2017   Procedure: RE-EXCISION OF LEFT BREAST LUMPECTOMY AND LEFT AXILLARY LYMPH NODE DISSECTION;  Surgeon: Coralie Keens, MD;  Location: [place];  Service: General;  Laterality: Left;  . BREAST LUMPECTOMY WITH RADIOACTIVE SEED AND SENTINEL LYMPH NODE BIOPSY Left 01/22/2017   Procedure: BREAST LUMPECTOMY WITH RADIOACTIVE SEED AND SENTINEL LYMPH NODE BIOPSY;  Surgeon: Coralie Keens, MD;  Location: [place];  Service: General;  Laterality: Left;  . COLONOSCOPY N/A 10/26/2015   Procedure: COLONOSCOPY;  Surgeon: Rogene Houston, MD;  [facility];  Service: Endoscopy;  Laterality: N/A;  . DILATION AND CURETTAGE OF UTERUS    . ESOPHAGOGASTRODUODENOSCOPY N/A 10/26/2015   Procedure: ESOPHAGOGASTRODUODENOSCOPY (EGD);  Surgeon: Rogene Houston, MD;  [facility];  Service: Endoscopy;  Laterality: N/A;  1:00  . PORTACATH PLACEMENT Right 01/22/2017   Procedure: INSERTION PORT-A-CATH;  Surgeon: Coralie Keens, MD;  Location: [place];  Service: General;  Laterality: Right;  . URETHRAL DILATION      There were no vitals filed for this visit.      Subjective Assessment - 07/25/17 0946    Subjective "I notice when I do (the neural tension stretch) exercise, that I don't feel the pulling anymore.   Currently in Pain? No/denies               LYMPHEDEMA/ONCOLOGY QUESTIONNAIRE - 07/25/17 0949      Right Upper Extremity Lymphedema   15 cm Proximal to Olecranon Process 37.9 cm   10 cm Proximal to Olecranon Process 36.7 cm   Olecranon Process 29 cm   15 cm Proximal to Ulnar Styloid Process 29.4 cm   10 cm Proximal to Ulnar Styloid Process 26.2 cm   Just Proximal to Ulnar Styloid Process 17.7 cm   Across Hand at [employer] 20.4 cm   At [place] of 2nd Digit 6.6 cm     Left Upper Extremity Lymphedema   15 cm Proximal to Olecranon Process 37.6 cm   10 cm Proximal to Olecranon Process 36.3 cm   Olecranon Process 28.6 cm   15 cm Proximal to Ulnar Styloid Process 28.8 cm   10 cm Proximal to Ulnar Styloid Process 25.9 cm   Just Proximal to Ulnar Styloid Process 17.6 cm  Across Hand at [employer] 20.5 cm   At Radar Base of 2nd Digit 6.4 cm                  [facility] Adult PT Treatment/Exercise - 07/25/17 0001      Manual Therapy   Manual Therapy Manual Lymphatic Drainage (MLD)   Manual Lymphatic Drainage (MLD) Instructed patient in diaphragmatic breathing, then instructed her about manual lymph drainage as the following was performed: short neck, right axilla and anterior interaxillary anastomosis, left groin and axillo-inguinal anastomosis, and left UE from fingers to shoulder.  Then continued instruction and had patient perform all that herself.                PT Education - 07/25/17 1238     Education provided Yes   Education Details self manual lymph drainage   Person(s) Educated Patient   Methods Explanation;Demonstration;Handout   Comprehension Verbalized understanding;Returned demonstration                [facility] Goals - 07/25/17 0947      CC Long Term Goal  #1   Title Pt. will be independent in HEP for left shoulder and UE stretching to release cording   Status Partially Met     CC Long Term Goal  #2   Title Pt. will report at least 60% decreasein feeling of pulling or tightness when she reaches up or out with the left arm.   Baseline 100% better as of 07/25/17; feels no pulling   Status Achieved     CC Long Term Goal  #3   Title Pt. will be independent in self-manual lymph drainage if appropriate   Status Partially Met     CC Long Term Goal  #4   Title Pt. will be knowledgeable about how and where to obtain a new compression sleeve and possible glove if appropriate   Baseline Since her arm has reduced some, she feels her current sleeve is fitting well and feeling comfortable as of 07/25/17.   Status Deferred            Plan - 07/25/17 1237    Clinical Impression Statement Left UE circumference measurements show good reductions today.  Instructed pt. in self-manual lymph drainage, which she did well.  Goal related to reduction of tightness met and other goals partially met.   Rehab Potential Excellent   Clinical Impairments Affecting Rehab Potential previous chemo with resolving, but present, neuropathy symptoms; potentially starting radiation 07/28/17    PT Frequency 2x / week   PT Duration 2 weeks  to 4 weeks prn   PT Treatment/Interventions ADLs/Self Care Home Management;DME Instruction;Therapeutic exercise;Patient/family education;Orthotic Fit/Training;Manual techniques;Manual lymph drainage;Passive range of motion;Taping   PT Next Visit Plan Review supine scapular series and progress to Strength ABC Program. Cont manual therapy as time  allows to cording.   PT Home Exercise Plan dowel, doorway, and neural tension stretches, supine scap   Consulted and Agree with Plan of Care Patient      Patient will benefit from skilled therapeutic intervention in order to improve the following deficits and impairments:  Decreased range of motion, Increased fascial restricitons, Decreased knowledge of precautions, Decreased knowledge of use of DME  Visit Diagnosis: Stiffness of left shoulder, not elsewhere classified  Pain in left arm  Lymphedema, not elsewhere classified     Problem List Patient Active Problem List   Diagnosis Date Noted  . Genetic testing 01/20/2017  . Breast cancer of  lower-outer quadrant of left female breast ([place]) 01/03/2017  . Family history of breast cancer   . Prediabetes 10/10/2016  . Hypertriglyceridemia 04/05/2016  . BMI 32.0-32.9,adult 09/22/2015  . Abdominal pain, right upper quadrant 07/11/2015    Yanet Balliet 07/25/2017, 12:40 PM  [place], [place], [address] Phone: [phone]   Fax:  [phone]  Name: Shannon Logan MRN: [id number] Date of Birth: 1965-12-27  Serafina Royals, PT 07/25/17 12:40 PM

## 2017-07-25 NOTE — Patient Instructions (Signed)
Deep Effective Breath   Standing, sitting, or laying down, place both hands on the belly. Take a deep breath IN, expanding the belly; then breath OUT, contracting the belly. Repeat __5__ times. Do __2-3__ sessions per day and before your self massage.  http://gt2.exer.us/866   Copyright  VHI. All rights reserved.  Axilla to Axilla - Pump   On uninvolved side make 5 circles in the armpit, then pump _5__ times from involved armpit across chest to uninvolved armpit, making a pathway. Do _1__ time per day.  Copyright  VHI. All rights reserved.  Axilla to Inguinal Nodes - Pump   On involved side, make 5 circles at groin at panty line, then pump _5__ times from armpit along side of trunk to outer hip, making your other pathway. Do __1_ time per day.  Copyright  VHI. All rights reserved.  Arm Posterior: Elbow to Shoulder - Pump   Pump _5__ times from back of elbow to top of shoulder. Then inner to outer upper arm _5_ times, then outer arm again _5_ times. Then back to the pathways _2-3_ times. Do _1__ time per day.  Copyright  VHI. All rights reserved.  ARM: [place] Wrist to Elbow - Pump   Pump or stationary circles _5__ times from wrist to elbow making sure to do both sides of the forearm. Then retrace your steps to the outer arm, and the pathways _2-3_ times each. Do _1__ time per day.  Copyright  VHI. All rights reserved.  ARM: Dorsum of Hand to Shoulder - Pump   Pump or stationary circles _5__ times on back of hand including knuckle spaces and individual fingers if needed working up towards the wrist, then retrace all your steps working back up the forearm, doing both sides; upper outer arm and back to your pathways _2-3_ times each. Then do 5 circles again at uninvolved armpit and involved groin where you started! Good job!! Do __1_ time per day.  Copyright  VHI. All rights reserved.      

## 2017-07-28 ENCOUNTER — Ambulatory Visit: Payer: BLUE CROSS/BLUE SHIELD | Admitting: Physical Therapy

## 2017-07-28 DIAGNOSIS — M25612 Stiffness of left shoulder, not elsewhere classified: Secondary | ICD-10-CM | POA: Diagnosis not present

## 2017-07-28 DIAGNOSIS — I890 Lymphedema, not elsewhere classified: Secondary | ICD-10-CM

## 2017-07-28 DIAGNOSIS — M79602 Pain in left arm: Secondary | ICD-10-CM

## 2017-07-28 NOTE — Therapy (Signed)
[place], [place], [address] Phone: [phone]   Fax:  [phone]  Physical Therapy Treatment  Patient Details  Name: Shannon Logan MRN: [id number] Date of Birth: 1966/03/22 Referring Provider: Dr. Eppie Gibson  Encounter Date: 07/28/2017    Past Medical History:  Diagnosis Date  . Breast cancer of lower-outer quadrant of left female breast ([place]) 01/03/2017  . Cancer ([place]) 11/22/2016   Breast   . Family history of breast cancer   . GERD (gastroesophageal reflux disease)   . Personal history of chemotherapy    from 3/18 till 7/18    Past Surgical History:  Procedure Laterality Date  . BREAST LUMPECTOMY Left 01/22/17 and 01/30/17  . BREAST LUMPECTOMY WITH AXILLARY LYMPH NODE DISSECTION Left 01/30/2017   Procedure: RE-EXCISION OF LEFT BREAST LUMPECTOMY AND LEFT AXILLARY LYMPH NODE DISSECTION;  Surgeon: Coralie Keens, MD;  Location: [place];  Service: General;  Laterality: Left;  . BREAST LUMPECTOMY WITH RADIOACTIVE SEED AND SENTINEL LYMPH NODE BIOPSY Left 01/22/2017   Procedure: BREAST LUMPECTOMY WITH RADIOACTIVE SEED AND SENTINEL LYMPH NODE BIOPSY;  Surgeon: Coralie Keens, MD;  Location: [place];  Service: General;  Laterality: Left;  . COLONOSCOPY N/A 10/26/2015   Procedure: COLONOSCOPY;  Surgeon: Rogene Houston, MD;  [facility];  Service: Endoscopy;  Laterality: N/A;  . DILATION AND CURETTAGE OF UTERUS    . ESOPHAGOGASTRODUODENOSCOPY N/A 10/26/2015   Procedure: ESOPHAGOGASTRODUODENOSCOPY (EGD);  Surgeon: Rogene Houston, MD;  [facility];  Service: Endoscopy;  Laterality: N/A;  1:00  . PORTACATH PLACEMENT Right 01/22/2017   Procedure: INSERTION PORT-A-CATH;  Surgeon: Coralie Keens, MD;  Location: [place];  Service: General;  Laterality: Right;  . URETHRAL DILATION      There were no vitals filed for this  visit.      Subjective Assessment - 07/28/17 0849    Subjective Tried the manual lymph drainage at home and doesn't have questions.  I didn't do so well with  my exercises because for a while I couldn't find the Theraband.   Currently in Pain? No/denies                         [facility] Adult PT Treatment/Exercise - 07/28/17 0001      Exercises   Other Exercises  Instructed in strength ABC program guidelines, then had patient do all the stretches x 15 seconds each side, 10 of each core exercise, and chest press 10 reps with 1 lb. weights, squats x 10, one arm standing rows x 10 with 1 lb.,      Shoulder Exercises: Supine   Horizontal ABduction Strengthening;Both;5 reps;Theraband   Theraband Level (Shoulder Horizontal ABduction) Level 2 (Red)   External Rotation Strengthening;Both;5 reps;Theraband   Theraband Level (Shoulder External Rotation) Level 2 (Red)   Flexion Strengthening;Both;5 reps;Theraband  narrow and wide grips   Theraband Level (Shoulder Flexion) Level 2 (Red)   Other Supine Exercises Bil D2 with red theraband returning correct therapist demonstration, 5 times each side                PT Education - 07/28/17 1240    Education provided Yes   Education Details strength ABC program through scaption strengthening   Person(s) Educated Patient   Methods Explanation;Demonstration;Verbal cues;Handout   Comprehension Verbalized understanding;Returned demonstration  [facility] Goals - 07/25/17 0947      CC Long Term Goal  #1   Title Pt. will be independent in HEP for left shoulder and UE stretching to release cording   Status Partially Met     CC Long Term Goal  #2   Title Pt. will report at least 60% decreasein feeling of pulling or tightness when she reaches up or out with the left arm.   Baseline 100% better as of 07/25/17; feels no pulling   Status Achieved     CC Long Term Goal  #3   Title Pt. will be independent  in self-manual lymph drainage if appropriate   Status Partially Met     CC Long Term Goal  #4   Title Pt. will be knowledgeable about how and where to obtain a new compression sleeve and possible glove if appropriate   Baseline Since her arm has reduced some, she feels her current sleeve is fitting well and feeling comfortable as of 07/25/17.   Status Deferred            Plan - 07/28/17 1237    Clinical Impression Statement Pt. did well with review of supine scapular series using red Theraband today; she also did well with initial instruction in strength ABC program.   Rehab Potential Excellent   Clinical Impairments Affecting Rehab Potential previous chemo with resolving, but present, neuropathy symptoms; potentially starting radiation 07/28/17    PT Frequency 2x / week   PT Duration 2 weeks  to 4 weeks prn   PT Treatment/Interventions ADLs/Self Care Home Management;DME Instruction;Therapeutic exercise;Patient/family education;Orthotic Fit/Training;Manual techniques;Manual lymph drainage;Passive range of motion;Taping   PT Next Visit Plan Pick up strength ABC program after scaption and review the earlier stuff as needed. May be ready for DC soon.   PT Home Exercise Plan dowel, doorway, and neural tension stretches, supine scap   Consulted and Agree with Plan of Care Patient      Patient will benefit from skilled therapeutic intervention in order to improve the following deficits and impairments:  Decreased range of motion, Increased fascial restricitons, Decreased knowledge of precautions, Decreased knowledge of use of DME  Visit Diagnosis: Stiffness of left shoulder, not elsewhere classified  Pain in left arm  Lymphedema, not elsewhere classified     Problem List Patient Active Problem List   Diagnosis Date Noted  . Genetic testing 01/20/2017  . Breast cancer of lower-outer quadrant of left female breast ([place]) 01/03/2017  . Family history of breast cancer   . Prediabetes  10/10/2016  . Hypertriglyceridemia 04/05/2016  . BMI 32.0-32.9,adult 09/22/2015  . Abdominal pain, right upper quadrant 07/11/2015    S[place]IS[place],DONNA 07/28/2017, 12:42 PM  [place], [place], [address] Phone: [phone]   Fax:  [phone]  Name: TALINE NASS MRN: [id number] Date of Birth: Jul 06, 1966  Serafina Royals, PT 07/28/17 12:43 PM

## 2017-07-29 ENCOUNTER — Ambulatory Visit
Admission: RE | Admit: 2017-07-29 | Discharge: 2017-07-29 | Disposition: A | Discharge status: Home / Self Care | Payer: BLUE CROSS/BLUE SHIELD | Source: Ambulatory Visit | Attending: Radiation Oncology | Admitting: Radiation Oncology

## 2017-07-29 DIAGNOSIS — Z510 Encounter for antineoplastic radiation therapy: Secondary | ICD-10-CM | POA: Diagnosis not present

## 2017-07-30 ENCOUNTER — Ambulatory Visit
Admission: RE | Admit: 2017-07-30 | Discharge: 2017-07-30 | Disposition: A | Discharge status: Home / Self Care | Payer: BLUE CROSS/BLUE SHIELD | Source: Ambulatory Visit | Attending: Radiation Oncology | Admitting: Radiation Oncology

## 2017-07-30 ENCOUNTER — Ambulatory Visit: Payer: BLUE CROSS/BLUE SHIELD | Admitting: Physical Therapy

## 2017-07-30 DIAGNOSIS — Z171 Estrogen receptor negative status [ER-]: Principal | ICD-10-CM

## 2017-07-30 DIAGNOSIS — M79602 Pain in left arm: Secondary | ICD-10-CM

## 2017-07-30 DIAGNOSIS — Z510 Encounter for antineoplastic radiation therapy: Secondary | ICD-10-CM | POA: Diagnosis not present

## 2017-07-30 DIAGNOSIS — I890 Lymphedema, not elsewhere classified: Secondary | ICD-10-CM

## 2017-07-30 DIAGNOSIS — C50512 Malignant neoplasm of lower-outer quadrant of left female breast: Secondary | ICD-10-CM

## 2017-07-30 DIAGNOSIS — M25612 Stiffness of left shoulder, not elsewhere classified: Secondary | ICD-10-CM

## 2017-07-30 MED ORDER — ALRA NON-METALLIC DEODORANT (RAD-ONC)
1.0000 "application " | Freq: Once | TOPICAL | Status: AC
  Administered 2017-07-30: 1 via TOPICAL

## 2017-07-30 MED ORDER — RADIAPLEXRX EX GEL
Freq: Once | CUTANEOUS | Status: AC
  Administered 2017-07-30: 18:00:00 via TOPICAL

## 2017-07-30 NOTE — Therapy (Signed)
[place], [place], [address] Phone: [phone]   Fax:  [phone]  Physical Therapy Treatment  Patient Details  Name: Shannon Logan MRN: [id number] Date of Birth: 1966/01/08 Referring Provider: Dr. Eppie Gibson  Encounter Date: 07/30/2017      PT End of Session - 07/30/17 1225    Visit Number 6   Number of Visits 9   Date for PT Re-Evaluation 08/22/17   PT Start Time 0933   PT Stop Time 1014   PT Time Calculation (min) 41 min   Activity Tolerance Patient tolerated treatment well   Behavior During Therapy [facility] for tasks assessed/performed      Past Medical History:  Diagnosis Date  . Breast cancer of lower-outer quadrant of left female breast ([place]) 01/03/2017  . Cancer ([place]) 11/22/2016   Breast   . Family history of breast cancer   . GERD (gastroesophageal reflux disease)   . Personal history of chemotherapy    from 3/18 till 7/18    Past Surgical History:  Procedure Laterality Date  . BREAST LUMPECTOMY Left 01/22/17 and 01/30/17  . BREAST LUMPECTOMY WITH AXILLARY LYMPH NODE DISSECTION Left 01/30/2017   Procedure: RE-EXCISION OF LEFT BREAST LUMPECTOMY AND LEFT AXILLARY LYMPH NODE DISSECTION;  Surgeon: Coralie Keens, MD;  Location: [place];  Service: General;  Laterality: Left;  . BREAST LUMPECTOMY WITH RADIOACTIVE SEED AND SENTINEL LYMPH NODE BIOPSY Left 01/22/2017   Procedure: BREAST LUMPECTOMY WITH RADIOACTIVE SEED AND SENTINEL LYMPH NODE BIOPSY;  Surgeon: Coralie Keens, MD;  Location: [place];  Service: General;  Laterality: Left;  . COLONOSCOPY N/A 10/26/2015   Procedure: COLONOSCOPY;  Surgeon: Rogene Houston, MD;  [facility];  Service: Endoscopy;  Laterality: N/A;  . DILATION AND CURETTAGE OF UTERUS    . ESOPHAGOGASTRODUODENOSCOPY N/A 10/26/2015   Procedure: ESOPHAGOGASTRODUODENOSCOPY (EGD);  Surgeon: Rogene Houston, MD;  [facility];  Service: Endoscopy;  Laterality: N/A;  1:00  . PORTACATH PLACEMENT Right 01/22/2017   Procedure: INSERTION PORT-A-CATH;  Surgeon: Coralie Keens, MD;  Location: [place];  Service: General;  Laterality: Right;  . URETHRAL DILATION      There were no vitals filed for this visit.      Subjective Assessment - 07/30/17 0935    Subjective Just my wrist hurt--I don't know if it was from things we did or from falling asleep with my watch on and it swelled up a bit.   Currently in Pain? Yes   Pain Score 1    Pain Location Wrist   Pain Orientation Left   Pain Descriptors / Indicators Sore   Aggravating Factors  pressure on it   Pain Relieving Factors at rest                         [facility] Adult PT Treatment/Exercise - 07/30/17 0001      Exercises   Other Exercises  Instructed in the rest of strength ABC program, then went back to do a set of 10 of all the strengthening exercises; then core exercises x 10 each EXCEPT didn't do superwoman; then reviewed all stretches x 30 seconds each.                PT Education - 07/30/17 1010    Education provided Yes   Education Details finished teaching strength ABC program and reviewed all of it   Person(s)  Educated Patient   Methods Explanation;Demonstration;Verbal cues;Handout   Comprehension Verbalized understanding;Returned demonstration                [facility] Goals - 07/30/17 0953      CC Long Term Goal  #3   Title Pt. will be independent in self-manual lymph drainage if appropriate   Status Achieved            Plan - 07/30/17 1226    Clinical Impression Statement Pt. did well with instruction in strength ABC program today, but did need review of many of the things she learned last session.  We discussed discharge today and decided to continue for the next two weeks with sessions she has scheduled, to give more guidance on exercise that she can follow through on  at home.   Rehab Potential Excellent   Clinical Impairments Affecting Rehab Potential previous chemo with resolving, but present, neuropathy symptoms; potentially starting radiation 07/28/17    PT Frequency 2x / week   PT Duration 4 weeks   PT Treatment/Interventions ADLs/Self Care Home Management;DME Instruction;Therapeutic exercise;Patient/family education;Orthotic Fit/Training;Manual techniques;Manual lymph drainage;Passive range of motion;Taping   PT Next Visit Plan Review strength ABC program as needed. Continue progressive stretching and strengthening.  Continue through the week of 08/11/17.   PT Home Exercise Plan dowel, doorway, and neural tension stretches, supine scap, strength ABC   Consulted and Agree with Plan of Care Patient      Patient will benefit from skilled therapeutic intervention in order to improve the following deficits and impairments:  Decreased range of motion, Increased fascial restricitons, Decreased knowledge of precautions, Decreased knowledge of use of DME  Visit Diagnosis: Stiffness of left shoulder, not elsewhere classified  Pain in left arm  Lymphedema, not elsewhere classified     Problem List Patient Active Problem List   Diagnosis Date Noted  . Genetic testing 01/20/2017  . Breast cancer of lower-outer quadrant of left female breast ([place]) 01/03/2017  . Family history of breast cancer   . Prediabetes 10/10/2016  . Hypertriglyceridemia 04/05/2016  . BMI 32.0-32.9,adult 09/22/2015  . Abdominal pain, right upper quadrant 07/11/2015    Adysen Raphael 07/30/2017, 12:29 PM  [place], [place], [address] Phone: [phone]   Fax:  [phone]  Name: Shannon Logan MRN: [id number] Date of Birth: 1966/05/13  Serafina Royals, PT 07/30/17 12:29 PM

## 2017-07-30 NOTE — Progress Notes (Signed)
Pt educ done, [employer] crd, Radiation therapy and you book, skin products, alra, radiaplex given, discussed side effects, fatigue,pain,skin irritation, swelling of breast, increase protein in diet, stay hydrated, get enough rest, sleep, exercise, no undr wire bras, use of electric shaver only , teachback given 5:34 PM

## 2017-07-31 ENCOUNTER — Ambulatory Visit
Admission: RE | Admit: 2017-07-31 | Discharge: 2017-07-31 | Disposition: A | Discharge status: Home / Self Care | Payer: BLUE CROSS/BLUE SHIELD | Source: Ambulatory Visit | Attending: Radiation Oncology | Admitting: Radiation Oncology

## 2017-07-31 ENCOUNTER — Inpatient Hospital Stay: Admission: RE | Admit: 2017-07-31 | Payer: Self-pay | Source: Ambulatory Visit

## 2017-07-31 DIAGNOSIS — Z510 Encounter for antineoplastic radiation therapy: Secondary | ICD-10-CM | POA: Diagnosis not present

## 2017-08-01 ENCOUNTER — Ambulatory Visit
Admission: RE | Admit: 2017-08-01 | Discharge: 2017-08-01 | Disposition: A | Discharge status: Home / Self Care | Payer: BLUE CROSS/BLUE SHIELD | Source: Ambulatory Visit | Attending: Radiation Oncology | Admitting: Radiation Oncology

## 2017-08-01 DIAGNOSIS — Z510 Encounter for antineoplastic radiation therapy: Secondary | ICD-10-CM | POA: Diagnosis not present

## 2017-08-04 ENCOUNTER — Ambulatory Visit
Admission: RE | Admit: 2017-08-04 | Discharge: 2017-08-04 | Disposition: A | Discharge status: Home / Self Care | Payer: BLUE CROSS/BLUE SHIELD | Source: Ambulatory Visit | Attending: Radiation Oncology | Admitting: Radiation Oncology

## 2017-08-04 ENCOUNTER — Inpatient Hospital Stay: Admission: RE | Admit: 2017-08-04 | Payer: BLUE CROSS/BLUE SHIELD | Source: Ambulatory Visit

## 2017-08-04 ENCOUNTER — Ambulatory Visit: Admission: RE | Admit: 2017-08-04 | Payer: BLUE CROSS/BLUE SHIELD | Source: Ambulatory Visit

## 2017-08-04 ENCOUNTER — Ambulatory Visit: Payer: BLUE CROSS/BLUE SHIELD | Admitting: Physical Therapy

## 2017-08-04 DIAGNOSIS — M25612 Stiffness of left shoulder, not elsewhere classified: Secondary | ICD-10-CM | POA: Diagnosis not present

## 2017-08-04 DIAGNOSIS — M79602 Pain in left arm: Secondary | ICD-10-CM

## 2017-08-04 DIAGNOSIS — Z510 Encounter for antineoplastic radiation therapy: Secondary | ICD-10-CM | POA: Diagnosis not present

## 2017-08-04 NOTE — Therapy (Signed)
[place], [place], [address] Phone: [phone]   Fax:  [phone]  Physical Therapy Treatment  Patient Details  Name: Shannon Logan MRN: [id number] Date of Birth: 1966-03-25 Referring Provider: Dr. Eppie Gibson  Encounter Date: 08/04/2017      PT End of Session - 08/04/17 1029    Visit Number 7   Number of Visits 9   Date for PT Re-Evaluation 08/22/17   PT Start Time 0850   PT Stop Time 0935   PT Time Calculation (min) 45 min   Activity Tolerance Patient tolerated treatment well   Behavior During Therapy [facility] for tasks assessed/performed      Past Medical History:  Diagnosis Date  . Breast cancer of lower-outer quadrant of left female breast ([place]) 01/03/2017  . Cancer ([place]) 11/22/2016   Breast   . Family history of breast cancer   . GERD (gastroesophageal reflux disease)   . Personal history of chemotherapy    from 3/18 till 7/18    Past Surgical History:  Procedure Laterality Date  . BREAST LUMPECTOMY Left 01/22/17 and 01/30/17  . BREAST LUMPECTOMY WITH AXILLARY LYMPH NODE DISSECTION Left 01/30/2017   Procedure: RE-EXCISION OF LEFT BREAST LUMPECTOMY AND LEFT AXILLARY LYMPH NODE DISSECTION;  Surgeon: Coralie Keens, MD;  Location: [place];  Service: General;  Laterality: Left;  . BREAST LUMPECTOMY WITH RADIOACTIVE SEED AND SENTINEL LYMPH NODE BIOPSY Left 01/22/2017   Procedure: BREAST LUMPECTOMY WITH RADIOACTIVE SEED AND SENTINEL LYMPH NODE BIOPSY;  Surgeon: Coralie Keens, MD;  Location: [place];  Service: General;  Laterality: Left;  . COLONOSCOPY N/A 10/26/2015   Procedure: COLONOSCOPY;  Surgeon: Rogene Houston, MD;  [facility];  Service: Endoscopy;  Laterality: N/A;  . DILATION AND CURETTAGE OF UTERUS    . ESOPHAGOGASTRODUODENOSCOPY N/A 10/26/2015   Procedure: ESOPHAGOGASTRODUODENOSCOPY (EGD);  Surgeon: Rogene Houston, MD;  [facility];  Service: Endoscopy;  Laterality: N/A;  1:00  . PORTACATH PLACEMENT Right 01/22/2017   Procedure: INSERTION PORT-A-CATH;  Surgeon: Coralie Keens, MD;  Location: [place];  Service: General;  Laterality: Right;  . URETHRAL DILATION      There were no vitals filed for this visit.      Subjective Assessment - 08/04/17 0859    Subjective "Can you print out copies of all my exercises?  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   Currently in Pain? Yes   Pain Score 1    Pain Location Toe (Comment which one)  all   Pain Orientation Right;Left   Pain Descriptors / Indicators Burning   Aggravating Factors  neuropathy   Pain Relieving Factors nothing                         [facility] Adult PT Treatment/Exercise - 08/04/17 0001      Self-Care   Other Self-Care Comments  printed out extra copies of exercise handouts for her     Elbow Exercises   Elbow Flexion Strengthening;Right;Left;Bar weights/barbell;20 reps  3 lbs. each side   Elbow Extension Strengthening;Both;20 reps;Bar weights/barbell   Bar Weights/Barbell (Elbow Extension) 2 lbs     Shoulder Exercises: Seated   Other Seated Exercises overhead press with 2 lbs. in each hand x 10 x 2  fatigued by end of  2nd set of 10     Shoulder Exercises: Standing   Retraction Strengthening;Both;20 reps;Theraband   Theraband Level (Shoulder Retraction) Level 2 (Red)   Other Standing Exercises 3-way arm raises with 2 lb. weight on each wrist x 10 each direction, 2 sets   Other Standing Exercises wall pushups x 10; standing with 1 lb. in each hand, aternating punches x 30 seconds     Shoulder Exercises: Therapy Ball   Flexion 10 reps  with 2 lb. weight on each wrist     Shoulder Exercises: Stretch   Other Shoulder Stretches standing horizontal abduction x 30 seconds; triceps stretches x 30 seconds each;    Other  Shoulder Stretches standing abduction stretch with leaning body to opposite side x 30 seconds each side     Manual Therapy   Myofascial Release left UE myofascial pulling with movement into abduction   Passive ROM in supine to left shoulder into er and flexion with stretch to pt. tolerance                PT Education - 08/04/17 1029    Education provided Yes   Education Details reprinted copies of HEPs and gave another copy of strength ABC program handout   Person(s) Educated Patient   Methods Handout   Comprehension Verbalized understanding                [facility] Goals - 07/30/17 [phone]      CC Long Term Goal  #3   Title Pt. will be independent in self-manual lymph drainage if appropriate   Status Achieved            Plan - 08/04/17 1029    Clinical Impression Statement Pt. was challenged by several of the exercises today, including 3-way arm raises with 2 lb. weights and overhead presses. She continues to tolerate stretching well, but has some tightness. 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   Rehab Potential Excellent   Clinical Impairments Affecting Rehab Potential previous chemo with resolving, but present, neuropathy symptoms; potentially starting radiation 07/28/17    PT Frequency 2x / week   PT Duration 4 weeks   PT Treatment/Interventions ADLs/Self Care Home Management;DME Instruction;Therapeutic exercise;Patient/family education;Orthotic Fit/Training;Manual techniques;Manual lymph drainage;Passive range of motion;Taping   PT Next Visit Plan Review strength ABC program as needed. Continue progressive stretching and strengthening.  Continue through the week of 08/11/17.   PT Home Exercise Plan dowel, doorway, and neural tension stretches, supine scap, strength ABC   Consulted and Agree with Plan of Care Patient      Patient will benefit from skilled therapeutic intervention in order to improve the following  deficits and impairments:  Decreased range of motion, Increased fascial restricitons, Decreased knowledge of precautions, Decreased knowledge of use of DME  Visit Diagnosis: Stiffness of left shoulder, not elsewhere classified  Pain in left arm     Problem List Patient Active Problem List   Diagnosis Date Noted  . Genetic testing 01/20/2017  . Breast cancer of lower-outer quadrant of left female breast ([place]) 01/03/2017  . Family history of breast cancer   . Prediabetes 10/10/2016  . Hypertriglyceridemia 04/05/2016  . BMI 32.0-32.9,adult 09/22/2015  . Abdominal pain, right upper quadrant 07/11/2015    SALISBURY,DONNA 08/04/2017, 10:32 AM  [place], [place], [address] Phone: [phone]   Fax:  [phone]  Name: Shannon Logan MRN: [id number] Date  of Birth: 04-09-1966  Serafina Royals, PT 08/04/17 10:32 AM

## 2017-08-05 ENCOUNTER — Ambulatory Visit
Admission: RE | Admit: 2017-08-05 | Discharge: 2017-08-05 | Disposition: A | Discharge status: Home / Self Care | Payer: BLUE CROSS/BLUE SHIELD | Source: Ambulatory Visit | Attending: Radiation Oncology | Admitting: Radiation Oncology

## 2017-08-05 DIAGNOSIS — Z510 Encounter for antineoplastic radiation therapy: Secondary | ICD-10-CM | POA: Diagnosis not present

## 2017-08-06 ENCOUNTER — Ambulatory Visit
Admission: RE | Admit: 2017-08-06 | Discharge: 2017-08-06 | Disposition: A | Discharge status: Home / Self Care | Payer: BLUE CROSS/BLUE SHIELD | Source: Ambulatory Visit | Attending: Radiation Oncology | Admitting: Radiation Oncology

## 2017-08-06 ENCOUNTER — Ambulatory Visit: Payer: BLUE CROSS/BLUE SHIELD | Admitting: Physical Therapy

## 2017-08-06 DIAGNOSIS — M25612 Stiffness of left shoulder, not elsewhere classified: Secondary | ICD-10-CM

## 2017-08-06 DIAGNOSIS — I890 Lymphedema, not elsewhere classified: Secondary | ICD-10-CM

## 2017-08-06 DIAGNOSIS — M79602 Pain in left arm: Secondary | ICD-10-CM

## 2017-08-06 DIAGNOSIS — Z510 Encounter for antineoplastic radiation therapy: Secondary | ICD-10-CM | POA: Diagnosis not present

## 2017-08-06 NOTE — Therapy (Signed)
[place], [place], [address] Phone: [phone]   Fax:  [phone]  Physical Therapy Treatment  Patient Details  Name: Shannon Logan MRN: [id number] Date of Birth: Feb 10, 1966 Referring Provider: Dr. Eppie Gibson  Encounter Date: 08/06/2017      PT End of Session - 08/06/17 1024    Visit Number 8   Number of Visits 9   Date for PT Re-Evaluation 08/22/17   PT Start Time 0935   PT Stop Time 1015   PT Time Calculation (min) 40 min   Activity Tolerance Patient tolerated treatment well   Behavior During Therapy [facility] for tasks assessed/performed      Past Medical History:  Diagnosis Date  . Breast cancer of lower-outer quadrant of left female breast ([place]) 01/03/2017  . Cancer ([place]) 11/22/2016   Breast   . Family history of breast cancer   . GERD (gastroesophageal reflux disease)   . Personal history of chemotherapy    from 3/18 till 7/18    Past Surgical History:  Procedure Laterality Date  . BREAST LUMPECTOMY Left 01/22/17 and 01/30/17  . BREAST LUMPECTOMY WITH AXILLARY LYMPH NODE DISSECTION Left 01/30/2017   Procedure: RE-EXCISION OF LEFT BREAST LUMPECTOMY AND LEFT AXILLARY LYMPH NODE DISSECTION;  Surgeon: Coralie Keens, MD;  Location: [place];  Service: General;  Laterality: Left;  . BREAST LUMPECTOMY WITH RADIOACTIVE SEED AND SENTINEL LYMPH NODE BIOPSY Left 01/22/2017   Procedure: BREAST LUMPECTOMY WITH RADIOACTIVE SEED AND SENTINEL LYMPH NODE BIOPSY;  Surgeon: Coralie Keens, MD;  Location: [place];  Service: General;  Laterality: Left;  . COLONOSCOPY N/A 10/26/2015   Procedure: COLONOSCOPY;  Surgeon: Rogene Houston, MD;  [facility];  Service: Endoscopy;  Laterality: N/A;  . DILATION AND CURETTAGE OF UTERUS    . ESOPHAGOGASTRODUODENOSCOPY N/A 10/26/2015   Procedure: ESOPHAGOGASTRODUODENOSCOPY (EGD);  Surgeon: Rogene Houston, MD;  [facility];  Service: Endoscopy;  Laterality: N/A;  1:00  . PORTACATH PLACEMENT Right 01/22/2017   Procedure: INSERTION PORT-A-CATH;  Surgeon: Coralie Keens, MD;  Location: [place];  Service: General;  Laterality: Right;  . URETHRAL DILATION      There were no vitals filed for this visit.      Subjective Assessment - 08/06/17 0937    Subjective no extra soreness from last session   Currently in Pain? Yes   Pain Score 1    Pain Location Hand   Pain Orientation Left   Pain Descriptors / Indicators Sore;Other (Comment)  bruised feeling   Aggravating Factors  clapping a lot   Pain Relieving Factors nothing                         [facility] Adult PT Treatment/Exercise - 08/06/17 0001      Shoulder Exercises: Sidelying   Other Sidelying Exercises active left UE D2 x 7 in right sidelying     Manual Therapy   Manual Therapy Scapular mobilization   Myofascial Release left UE myofascial pulling with movement into abduction in supine; in right sidelying, release with one hand on left upper arm and other hand at left flank, then left abdomen in a couple of diagonal positions   Scapular Mobilization to left scapula in right sidelying with gentle rocking toward protraction and depression   Passive ROM in supine to left shoulder into er, abduction and flexion with stretch to pt. tolerance.  In  right sidelying, for left shoulder abduction. Also for left shoulder horizontal abduction, then stretch into scaption.                        [facility] Goals - 07/30/17 [phone]      CC Long Term Goal  #3   Title Pt. will be independent in self-manual lymph drainage if appropriate   Status Achieved            Plan - 08/06/17 1024    Clinical Impression Statement Pt. still has tightness that benefitted from manual stretches today, for left shoulder in all directions.  She tolerates stretching fairly well and is able to increase ROM with slow  progressive stretches.   Rehab Potential Excellent   Clinical Impairments Affecting Rehab Potential previous chemo with resolving, but present, neuropathy symptoms; potentially starting radiation 07/28/17    PT Frequency 2x / week   PT Duration 4 weeks   PT Treatment/Interventions ADLs/Self Care Home Management;DME Instruction;Therapeutic exercise;Patient/family education;Orthotic Fit/Training;Manual techniques;Manual lymph drainage;Passive range of motion;Taping   PT Next Visit Plan Continue some strengthening as on 08/04/17. Continue manual stretching.  Continue through the week of 08/11/17.   PT Home Exercise Plan dowel, doorway, and neural tension stretches, supine scap, strength ABC   Consulted and Agree with Plan of Care Patient      Patient will benefit from skilled therapeutic intervention in order to improve the following deficits and impairments:  Decreased range of motion, Increased fascial restricitons, Decreased knowledge of precautions, Decreased knowledge of use of DME  Visit Diagnosis: Stiffness of left shoulder, not elsewhere classified  Pain in left arm  Lymphedema, not elsewhere classified     Problem List Patient Active Problem List   Diagnosis Date Noted  . Genetic testing 01/20/2017  . Breast cancer of lower-outer quadrant of left female breast ([place]) 01/03/2017  . Family history of breast cancer   . Prediabetes 10/10/2016  . Hypertriglyceridemia 04/05/2016  . BMI 32.0-32.9,adult 09/22/2015  . Abdominal pain, right upper quadrant 07/11/2015    SALISBURY,DONNA 08/06/2017, 10:27 AM  [place], [place], [address] Phone: [phone]   Fax:  [phone]  Name: Shannon Logan MRN: [id number] Date of Birth: [birth year]  Serafina Royals, PT 08/06/17 10:27 AM

## 2017-08-07 ENCOUNTER — Ambulatory Visit
Admission: RE | Admit: 2017-08-07 | Discharge: 2017-08-07 | Disposition: A | Discharge status: Home / Self Care | Payer: BLUE CROSS/BLUE SHIELD | Source: Ambulatory Visit | Attending: Radiation Oncology | Admitting: Radiation Oncology

## 2017-08-07 DIAGNOSIS — C50512 Malignant neoplasm of lower-outer quadrant of left female breast: Secondary | ICD-10-CM

## 2017-08-07 DIAGNOSIS — Z510 Encounter for antineoplastic radiation therapy: Secondary | ICD-10-CM | POA: Diagnosis not present

## 2017-08-07 DIAGNOSIS — Z171 Estrogen receptor negative status [ER-]: Principal | ICD-10-CM

## 2017-08-07 MED ORDER — RADIAPLEXRX EX GEL
Freq: Once | CUTANEOUS | Status: AC
  Administered 2017-08-07: 12:00:00 via TOPICAL

## 2017-08-08 ENCOUNTER — Ambulatory Visit
Admission: RE | Admit: 2017-08-08 | Discharge: 2017-08-08 | Disposition: A | Discharge status: Home / Self Care | Payer: BLUE CROSS/BLUE SHIELD | Source: Ambulatory Visit | Attending: Radiation Oncology | Admitting: Radiation Oncology

## 2017-08-08 DIAGNOSIS — Z510 Encounter for antineoplastic radiation therapy: Secondary | ICD-10-CM | POA: Diagnosis not present

## 2017-08-09 ENCOUNTER — Other Ambulatory Visit (HOSPITAL_COMMUNITY): Payer: Self-pay | Admitting: Adult Health

## 2017-08-11 ENCOUNTER — Ambulatory Visit
Admission: RE | Admit: 2017-08-11 | Discharge: 2017-08-11 | Disposition: A | Discharge status: Home / Self Care | Payer: BLUE CROSS/BLUE SHIELD | Source: Ambulatory Visit | Attending: Radiation Oncology | Admitting: Radiation Oncology

## 2017-08-11 ENCOUNTER — Ambulatory Visit: Payer: BLUE CROSS/BLUE SHIELD | Admitting: Physical Therapy

## 2017-08-11 DIAGNOSIS — M25612 Stiffness of left shoulder, not elsewhere classified: Secondary | ICD-10-CM | POA: Diagnosis not present

## 2017-08-11 DIAGNOSIS — I890 Lymphedema, not elsewhere classified: Secondary | ICD-10-CM

## 2017-08-11 DIAGNOSIS — M79602 Pain in left arm: Secondary | ICD-10-CM

## 2017-08-11 DIAGNOSIS — Z510 Encounter for antineoplastic radiation therapy: Secondary | ICD-10-CM | POA: Diagnosis not present

## 2017-08-11 NOTE — Therapy (Signed)
[place], [place], [address] Phone: [phone]   Fax:  [phone]  Physical Therapy Treatment  Patient Details  Name: Shannon Logan MRN: [id number] Date of Birth: 03/21/1966 Referring Provider: Dr. Eppie Gibson  Encounter Date: 08/11/2017      PT End of Session - 08/11/17 1216    Visit Number 9   Number of Visits 9   Date for PT Re-Evaluation 08/22/17   PT Start Time 0935   PT Stop Time 1016   PT Time Calculation (min) 41 min   Behavior During Therapy [facility] for tasks assessed/performed      Past Medical History:  Diagnosis Date  . Breast cancer of lower-outer quadrant of left female breast ([place]) 01/03/2017  . Cancer ([place]) 11/22/2016   Breast   . Family history of breast cancer   . GERD (gastroesophageal reflux disease)   . Personal history of chemotherapy    from 3/18 till 7/18    Past Surgical History:  Procedure Laterality Date  . BREAST LUMPECTOMY Left 01/22/17 and 01/30/17  . BREAST LUMPECTOMY WITH AXILLARY LYMPH NODE DISSECTION Left 01/30/2017   Procedure: RE-EXCISION OF LEFT BREAST LUMPECTOMY AND LEFT AXILLARY LYMPH NODE DISSECTION;  Surgeon: Coralie Keens, MD;  Location: [place];  Service: General;  Laterality: Left;  . BREAST LUMPECTOMY WITH RADIOACTIVE SEED AND SENTINEL LYMPH NODE BIOPSY Left 01/22/2017   Procedure: BREAST LUMPECTOMY WITH RADIOACTIVE SEED AND SENTINEL LYMPH NODE BIOPSY;  Surgeon: Coralie Keens, MD;  Location: [place];  Service: General;  Laterality: Left;  . COLONOSCOPY N/A 10/26/2015   Procedure: COLONOSCOPY;  Surgeon: Rogene Houston, MD;  [facility];  Service: Endoscopy;  Laterality: N/A;  . DILATION AND CURETTAGE OF UTERUS    . ESOPHAGOGASTRODUODENOSCOPY N/A 10/26/2015   Procedure: ESOPHAGOGASTRODUODENOSCOPY (EGD);  Surgeon: Rogene Houston, MD;  [facility];  Service: Endoscopy;  Laterality: N/A;   1:00  . PORTACATH PLACEMENT Right 01/22/2017   Procedure: INSERTION PORT-A-CATH;  Surgeon: Coralie Keens, MD;  Location: [place];  Service: General;  Laterality: Right;  . URETHRAL DILATION      There were no vitals filed for this visit.      Subjective Assessment - 08/11/17 0935    Subjective I've been sore up under here (left axilla), probably from our stretching.  And my hand's still sore.  I can reach up into the top cabinet now and I couldn't before.   Currently in Pain? Yes   Pain Score 1    Pain Location Axilla   Pain Orientation Left   Pain Descriptors / Indicators Sore  "just noticeable"   Aggravating Factors  stretching   Pain Relieving Factors just let it go, kept stretching                         [facility] Adult PT Treatment/Exercise - 08/11/17 0001      Shoulder Exercises: Standing   Other Standing Exercises punch forward x 30 seconds with 2 lb. weights on each wrist; wall pushups x 10     Shoulder Exercises: Pulleys   Flexion 2 minutes   ABduction 2 minutes     Shoulder Exercises: Therapy Ball   Flexion 10 reps  with 2 lb. weight on each wrist   ABduction 10 reps  2 lb. weight on wrist     Shoulder Exercises: ROM/Strengthening   "W" Arms 10x, 2 lb.  weights each wrist   X to V Arms 10x, 2 lb. weight on each wrist     Manual Therapy   Scapular Mobilization to left scapula in right sidelying with gentle rocking toward protraction and depression   Passive ROM in supine to left shoulder into er, abduction and flexion with stretch to pt. tolerance.  In right sidelying, for left shoulder abduction. Also for left shoulder horizontal abduction, then stretch into scaption.     Prosthetics   Current prosthetic wear tolerance (#hours/day)                           [facility] Goals - 07/30/17 0953      CC Long Term Goal  #3   Title Pt. will be independent in self-manual lymph drainage if appropriate   Status  Achieved            Plan - 08/11/17 1216    Clinical Impression Statement Pt.continues to do well.  She still has some tightness that benefits from manual and active therapies.    Rehab Potential Excellent   Clinical Impairments Affecting Rehab Potential previous chemo with resolving, but present, neuropathy symptoms; potentially starting radiation 07/28/17    PT Frequency 2x / week   PT Duration 4 weeks   PT Treatment/Interventions ADLs/Self Care Home Management;DME Instruction;Therapeutic exercise;Patient/family education;Orthotic Fit/Training;Manual techniques;Manual lymph drainage;Passive range of motion;Taping   PT Next Visit Plan Recheck goals; should be ready for discharge next visit.  Continue stretching and strengthening next visit.   Consulted and Agree with Plan of Care Patient      Patient will benefit from skilled therapeutic intervention in order to improve the following deficits and impairments:  Decreased range of motion, Increased fascial restricitons, Decreased knowledge of precautions, Decreased knowledge of use of DME  Visit Diagnosis: Stiffness of left shoulder, not elsewhere classified  Pain in left arm  Lymphedema, not elsewhere classified     Problem List Patient Active Problem List   Diagnosis Date Noted  . Genetic testing 01/20/2017  . Breast cancer of lower-outer quadrant of left female breast ([place]) 01/03/2017  . Family history of breast cancer   . Prediabetes 10/10/2016  . Hypertriglyceridemia 04/05/2016  . BMI 32.0-32.9,adult 09/22/2015  . Abdominal pain, right upper quadrant 07/11/2015    SALISBURY,DONNA 08/11/2017, 12:19 PM  [place], [place], [address] Phone: [phone]   Fax:  [phone]  Name: Shannon Logan MRN: [id number] Date of Birth: [birth year]  Serafina Royals, PT 08/11/17 12:19 PM

## 2017-08-12 ENCOUNTER — Ambulatory Visit
Admission: RE | Admit: 2017-08-12 | Discharge: 2017-08-12 | Disposition: A | Discharge status: Home / Self Care | Payer: BLUE CROSS/BLUE SHIELD | Source: Ambulatory Visit | Attending: Radiation Oncology | Admitting: Radiation Oncology

## 2017-08-12 DIAGNOSIS — Z510 Encounter for antineoplastic radiation therapy: Secondary | ICD-10-CM | POA: Diagnosis not present

## 2017-08-13 ENCOUNTER — Ambulatory Visit
Admission: RE | Admit: 2017-08-13 | Discharge: 2017-08-13 | Disposition: A | Discharge status: Home / Self Care | Payer: BLUE CROSS/BLUE SHIELD | Source: Ambulatory Visit | Attending: Radiation Oncology | Admitting: Radiation Oncology

## 2017-08-13 ENCOUNTER — Ambulatory Visit: Payer: BLUE CROSS/BLUE SHIELD | Admitting: Physical Therapy

## 2017-08-13 DIAGNOSIS — M79602 Pain in left arm: Secondary | ICD-10-CM

## 2017-08-13 DIAGNOSIS — Z510 Encounter for antineoplastic radiation therapy: Secondary | ICD-10-CM | POA: Diagnosis not present

## 2017-08-13 DIAGNOSIS — M25612 Stiffness of left shoulder, not elsewhere classified: Secondary | ICD-10-CM

## 2017-08-13 DIAGNOSIS — I890 Lymphedema, not elsewhere classified: Secondary | ICD-10-CM

## 2017-08-13 NOTE — Therapy (Signed)
[place], [place], [address] Phone: [phone]   Fax:  [phone]  Physical Therapy Treatment  Patient Details  Name: Shannon Logan MRN: [id number] Date of Birth: [birth year] Referring Provider: Dr. Eppie Gibson  Encounter Date: 08/13/2017      PT End of Session - 08/13/17 1157    Visit Number 10   Date for PT Re-Evaluation 08/22/17   PT Start Time 0925   PT Stop Time 1013   PT Time Calculation (min) 48 min   Activity Tolerance Patient tolerated treatment well   Behavior During Therapy [facility] for tasks assessed/performed      Past Medical History:  Diagnosis Date  . Breast cancer of lower-outer quadrant of left female breast ([place]) 01/03/2017  . Cancer ([place]) 11/22/2016   Breast   . Family history of breast cancer   . GERD (gastroesophageal reflux disease)   . Personal history of chemotherapy    from 3/18 till 7/18    Past Surgical History:  Procedure Laterality Date  . BREAST LUMPECTOMY Left 01/22/17 and 01/30/17  . BREAST LUMPECTOMY WITH AXILLARY LYMPH NODE DISSECTION Left 01/30/2017   Procedure: RE-EXCISION OF LEFT BREAST LUMPECTOMY AND LEFT AXILLARY LYMPH NODE DISSECTION;  Surgeon: Coralie Keens, MD;  Location: [place];  Service: General;  Laterality: Left;  . BREAST LUMPECTOMY WITH RADIOACTIVE SEED AND SENTINEL LYMPH NODE BIOPSY Left 01/22/2017   Procedure: BREAST LUMPECTOMY WITH RADIOACTIVE SEED AND SENTINEL LYMPH NODE BIOPSY;  Surgeon: Coralie Keens, MD;  Location: [place];  Service: General;  Laterality: Left;  . COLONOSCOPY N/A 10/26/2015   Procedure: COLONOSCOPY;  Surgeon: Rogene Houston, MD;  [facility];  Service: Endoscopy;  Laterality: N/A;  . DILATION AND CURETTAGE OF UTERUS    . ESOPHAGOGASTRODUODENOSCOPY N/A 10/26/2015   Procedure: ESOPHAGOGASTRODUODENOSCOPY (EGD);  Surgeon: Rogene Houston, MD;  [facility];   Service: Endoscopy;  Laterality: N/A;  1:00  . PORTACATH PLACEMENT Right 01/22/2017   Procedure: INSERTION PORT-A-CATH;  Surgeon: Coralie Keens, MD;  Location: [place];  Service: General;  Laterality: Right;  . URETHRAL DILATION      There were no vitals filed for this visit.      Subjective Assessment - 08/13/17 0926    Subjective It's still a little sore from last session, but not bad.   Currently in Pain? Yes   Pain Score 1    Pain Location Axilla   Pain Orientation Left   Pain Descriptors / Indicators Sore  not bad   Aggravating Factors  stretching   Pain Relieving Factors rest            [facility] PT Assessment - 08/13/17 0001      AROM   Right Shoulder External Rotation 100 Degrees  in sitting   Left Shoulder Flexion 160 Degrees           LYMPHEDEMA/ONCOLOGY QUESTIONNAIRE - 08/13/17 0935      Type   Cancer Type                    [facility] Adult PT Treatment/Exercise - 08/13/17 0001      Shoulder Exercises: Supine   Protraction Strengthening;Both;10 reps;Weights  in chest press motion, on foam roller   Protraction Weight (lbs) 2   Horizontal ABduction AROM;Both;10 reps  on foam roller   Other Supine Exercises on foam roller, D2 x 10 bilat. actively; chest press with 2 lb.  weights x 10     Manual Therapy   Manual Therapy Edema management   Edema Management circumference measurements taken   Myofascial Release in right sidelying, release with one hand on left upper arm and other hand at left flank, then left abdomen in a couple of diagonal positions   Scapular Mobilization to left scapula in right sidelying with gentle rocking toward protraction and depression   Passive ROM in supine to left shoulder into er, abduction and flexion with stretch to pt. tolerance.  In right sidelying, for left shoulder abduction. Also for left shoulder horizontal abduction, then stretch into scaption.     Prosthetics   Current prosthetic  weight-bearing tolerance (hours/day)                           Long Term Clinic Goals - 08/13/17 1937      CC Long Term Goal  #1   Title Pt. will be independent in HEP for left shoulder and UE stretching to release cording   Status Achieved     CC Long Term Goal  #2   Title Pt. will report at least 60% decreasein feeling of pulling or tightness when she reaches up or out with the left arm.   Status Achieved     CC Long Term Goal  #3   Title Pt. will be independent in self-manual lymph drainage if appropriate   Status Achieved     CC Long Term Goal  #4   Title Pt. will be knowledgeable about how and where to obtain a new compression sleeve and possible glove if appropriate   Baseline Since her arm has reduced some, she feels her current sleeve is fitting well and feeling comfortable as of 07/25/17.   Status Achieved            Plan - 08/13/17 1158    Clinical Impression Statement Pt. is doing very well.  Goals have been met.  She has a home exercise program in place. Left arm measurements are steady or decreasing. Ready for discharge.   Rehab Potential Excellent   Clinical Impairments Affecting Rehab Potential previous chemo with resolving, but present, neuropathy symptoms; potentially starting radiation 07/28/17    PT Frequency 2x / week   PT Duration 4 weeks   PT Treatment/Interventions ADLs/Self Care Home Management;DME Instruction;Therapeutic exercise;Patient/family education;Orthotic Fit/Training;Manual techniques;Manual lymph drainage;Passive range of motion;Taping   PT Next Visit Plan None; discharge today.   PT Home Exercise Plan dowel, doorway, and neural tension stretches, supine scap, strength ABC      Patient will benefit from skilled therapeutic intervention in order to improve the following deficits and impairments:  Decreased range of motion, Increased fascial restricitons, Decreased knowledge of precautions, Decreased knowledge of use of  DME  Visit Diagnosis: Stiffness of left shoulder, not elsewhere classified  Pain in left arm  Lymphedema, not elsewhere classified     Problem List Patient Active Problem List   Diagnosis Date Noted  . Genetic testing 01/20/2017  . Breast cancer of lower-outer quadrant of left female breast ([place]) 01/03/2017  . Family history of breast cancer   . Prediabetes 10/10/2016  . Hypertriglyceridemia 04/05/2016  . BMI 32.0-32.9,adult 09/22/2015  . Abdominal pain, right upper quadrant 07/11/2015    SALISBURY,DONNA 08/13/2017, 12:00 PM  [place], [place], [address] Phone: [phone]   Fax:  [phone]  Name: Shannon Logan MRN: [id number] Date of  Birth: Apr 16, 1966  PHYSICAL THERAPY DISCHARGE SUMMARY  Visits from Start of Care: 10  Current functional level related to goals / functional outcomes: Goals met as noted above.   Remaining deficits: Mild tightness in left shoulder area. Swelling continues off and on throughout her body.   Education / Equipment: Home exercise program, manual lymph drainage, use of compression sleeve. Plan: Patient agrees to discharge.  Patient goals were met. Patient is being discharged due to meeting the stated rehab goals.  ?????     Serafina Royals, PT 08/13/17 12:02 PM

## 2017-08-14 ENCOUNTER — Ambulatory Visit
Admission: RE | Admit: 2017-08-14 | Discharge: 2017-08-14 | Disposition: A | Discharge status: Home / Self Care | Payer: BLUE CROSS/BLUE SHIELD | Source: Ambulatory Visit | Attending: Radiation Oncology | Admitting: Radiation Oncology

## 2017-08-14 DIAGNOSIS — Z510 Encounter for antineoplastic radiation therapy: Secondary | ICD-10-CM | POA: Diagnosis not present

## 2017-08-15 ENCOUNTER — Ambulatory Visit
Admission: RE | Admit: 2017-08-15 | Discharge: 2017-08-15 | Disposition: A | Discharge status: Home / Self Care | Payer: BLUE CROSS/BLUE SHIELD | Source: Ambulatory Visit | Attending: Radiation Oncology | Admitting: Radiation Oncology

## 2017-08-15 DIAGNOSIS — Z510 Encounter for antineoplastic radiation therapy: Secondary | ICD-10-CM | POA: Diagnosis not present

## 2017-08-19 ENCOUNTER — Ambulatory Visit
Admission: RE | Admit: 2017-08-19 | Discharge: 2017-08-19 | Disposition: A | Discharge status: Home / Self Care | Payer: BLUE CROSS/BLUE SHIELD | Source: Ambulatory Visit | Attending: Radiation Oncology | Admitting: Radiation Oncology

## 2017-08-19 DIAGNOSIS — C50512 Malignant neoplasm of lower-outer quadrant of left female breast: Secondary | ICD-10-CM

## 2017-08-19 DIAGNOSIS — Z171 Estrogen receptor negative status [ER-]: Principal | ICD-10-CM

## 2017-08-19 DIAGNOSIS — Z510 Encounter for antineoplastic radiation therapy: Secondary | ICD-10-CM | POA: Diagnosis not present

## 2017-08-19 MED ORDER — RADIAPLEXRX EX GEL
Freq: Once | CUTANEOUS | Status: AC
  Administered 2017-08-19: 12:00:00 via TOPICAL

## 2017-08-20 ENCOUNTER — Ambulatory Visit
Admission: RE | Admit: 2017-08-20 | Discharge: 2017-08-20 | Disposition: A | Discharge status: Home / Self Care | Payer: BLUE CROSS/BLUE SHIELD | Source: Ambulatory Visit | Attending: Radiation Oncology | Admitting: Radiation Oncology

## 2017-08-20 DIAGNOSIS — Z510 Encounter for antineoplastic radiation therapy: Secondary | ICD-10-CM | POA: Diagnosis not present

## 2017-08-21 ENCOUNTER — Ambulatory Visit
Admission: RE | Admit: 2017-08-21 | Discharge: 2017-08-21 | Disposition: A | Discharge status: Home / Self Care | Payer: BLUE CROSS/BLUE SHIELD | Source: Ambulatory Visit | Attending: Radiation Oncology | Admitting: Radiation Oncology

## 2017-08-21 DIAGNOSIS — Z510 Encounter for antineoplastic radiation therapy: Secondary | ICD-10-CM | POA: Diagnosis not present

## 2017-08-22 ENCOUNTER — Ambulatory Visit
Admission: RE | Admit: 2017-08-22 | Discharge: 2017-08-22 | Disposition: A | Discharge status: Home / Self Care | Payer: BLUE CROSS/BLUE SHIELD | Source: Ambulatory Visit | Attending: Radiation Oncology | Admitting: Radiation Oncology

## 2017-08-22 DIAGNOSIS — R1313 Dysphagia, pharyngeal phase: Secondary | ICD-10-CM | POA: Insufficient documentation

## 2017-08-22 DIAGNOSIS — Z510 Encounter for antineoplastic radiation therapy: Secondary | ICD-10-CM | POA: Diagnosis not present

## 2017-08-22 DIAGNOSIS — K219 Gastro-esophageal reflux disease without esophagitis: Secondary | ICD-10-CM | POA: Insufficient documentation

## 2017-08-22 DIAGNOSIS — R040 Epistaxis: Secondary | ICD-10-CM | POA: Insufficient documentation

## 2017-08-25 ENCOUNTER — Encounter (INDEPENDENT_AMBULATORY_CARE_PROVIDER_SITE_OTHER): Payer: Self-pay | Admitting: Internal Medicine

## 2017-08-25 ENCOUNTER — Ambulatory Visit
Admission: RE | Admit: 2017-08-25 | Discharge: 2017-08-25 | Disposition: A | Discharge status: Home / Self Care | Payer: BLUE CROSS/BLUE SHIELD | Source: Ambulatory Visit | Attending: Radiation Oncology | Admitting: Radiation Oncology

## 2017-08-25 ENCOUNTER — Ambulatory Visit (INDEPENDENT_AMBULATORY_CARE_PROVIDER_SITE_OTHER): Payer: BLUE CROSS/BLUE SHIELD | Admitting: Internal Medicine

## 2017-08-25 VITALS — BP 124/82 | HR 64 | Temp 98.0°F | Ht 65.0 in | Wt 218.0 lb

## 2017-08-25 DIAGNOSIS — Z510 Encounter for antineoplastic radiation therapy: Secondary | ICD-10-CM | POA: Diagnosis not present

## 2017-08-25 DIAGNOSIS — K219 Gastro-esophageal reflux disease without esophagitis: Secondary | ICD-10-CM

## 2017-08-25 MED ORDER — PANTOPRAZOLE SODIUM 40 MG PO TBEC: 40.0000 mg | DELAYED_RELEASE_TABLET | Freq: Every day | ORAL | 3 refills | Status: DC

## 2017-08-25 NOTE — Patient Instructions (Signed)
OV in 1 year. Continue the Protonix 

## 2017-08-25 NOTE — Progress Notes (Signed)
Subjective:    Patient ID: Shannon Logan, female    DOB: [birth year], 51 y.o.   MRN: [id number] Wt 212 in March HPI Here today for f/u. She was last seen in March. She recently saw ENT for GERD. Advised her to take the Protonix with the evening meal. She just started Friday. She tells me she is doing good.  Her appetite is good. No weight loss. BMs x 1 day.     Recent hx of breast cancer and underwent a lumpectomy in February 2018. Is followed by the Cancer in [place].   Has started radiation at [facility].    CBC    Component Value Date/Time   WBC 5.7 06/30/2017 0951   RBC 3.35 (L) 06/30/2017 0951   HGB 11.2 (L) 06/30/2017 0951   HCT 34.2 (L) 06/30/2017 0951   PLT 285 06/30/2017 0951   MCV 102.1 (H) 06/30/2017 0951   MCV 90.2 07/02/2013 1523   MCH 33.4 06/30/2017 0951   MCHC 32.7 06/30/2017 0951   RDW 14.7 06/30/2017 0951   LYMPHSABS 1.2 06/30/2017 0951   MONOABS 0.4 06/30/2017 0951   EOSABS 0.1 06/30/2017 0951   BASOSABS 0.0 06/30/2017 0951     11/10/2-16: Procedure: EGD &Colonoscopy  Indications:Patient is 51 year old Caucasian female who presents with chronic right upper quadrant postprandial pain along with bloating. Ultrasound negative for cholelithiasis and HIDA scan revealed normal EF. She also has intermittent heartburn controlled with when necessary Zantac. She is undergoing diagnostic EGD followed by average risk screening colonoscopy.   Impression:  ED findings: Erosive reflux esophagitis. Small sliding hiatal hernia. No evidence of gastritis or peptic ulcer disease.  Colonoscopy findings: Normal colonoscopy except external hemorrhoids and anal papillae.   09/08/2015 HIDA scan:  IMPRESSION: No cystic duct obstruction. No CBD obstruction. Post CCK gallbladder ejection fraction 96%.   Review of Systems Past Medical History:  Diagnosis Date  . Breast cancer of lower-outer quadrant of left female breast ([place]) 01/03/2017  . Cancer  ([place]) 11/22/2016   Breast   . Family history of breast cancer   . GERD (gastroesophageal reflux disease)   . Personal history of chemotherapy    from 3/18 till 7/18    Past Surgical History:  Procedure Laterality Date  . BREAST LUMPECTOMY Left 01/22/17 and 01/30/17  . BREAST LUMPECTOMY WITH AXILLARY LYMPH NODE DISSECTION Left 01/30/2017   Procedure: RE-EXCISION OF LEFT BREAST LUMPECTOMY AND LEFT AXILLARY LYMPH NODE DISSECTION;  Surgeon: Coralie Keens, MD;  Location: [place];  Service: General;  Laterality: Left;  . BREAST LUMPECTOMY WITH RADIOACTIVE SEED AND SENTINEL LYMPH NODE BIOPSY Left 01/22/2017   Procedure: BREAST LUMPECTOMY WITH RADIOACTIVE SEED AND SENTINEL LYMPH NODE BIOPSY;  Surgeon: Coralie Keens, MD;  Location: [place];  Service: General;  Laterality: Left;  . COLONOSCOPY N/A 10/26/2015   Procedure: COLONOSCOPY;  Surgeon: Rogene Houston, MD;  [facility];  Service: Endoscopy;  Laterality: N/A;  . DILATION AND CURETTAGE OF UTERUS    . ESOPHAGOGASTRODUODENOSCOPY N/A 10/26/2015   Procedure: ESOPHAGOGASTRODUODENOSCOPY (EGD);  Surgeon: Rogene Houston, MD;  [facility];  Service: Endoscopy;  Laterality: N/A;  1:00  . PORTACATH PLACEMENT Right 01/22/2017   Procedure: INSERTION PORT-A-CATH;  Surgeon: Coralie Keens, MD;  Location: [place];  Service: General;  Laterality: Right;  . URETHRAL DILATION      Allergies  Allergen Reactions  . Asa [Aspirin]     Ate a lot of baby aspirin when  she was small, mom was told she was allergic, not sure the reaction  . Decadron [Dexamethasone]     Throat swelling, difficulty breathing, numbness face, tongue, facial flushing  . Prednisone     Numbness in face and arms  . Sulfa Antibiotics     Current Outpatient Prescriptions on File Prior to Visit  Medication Sig Dispense Refill  . ALLERGY 10 MG tablet Take 1 Tablet by mouth once daily 30 tablet 3  . hyaluronate  sodium (RADIAPLEXRX) GEL Apply 1 application topically 2 (two) times daily.    . Ibuprofen (MOTRIN PO) Take by mouth.    . lidocaine-prilocaine (EMLA) cream Apply to affected area once 30 g 3  . Misc. 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 1 each 0  . Misc. Devices MISC Please provide patient with compression sleeve for left arm due to swelling from lumpectomy with lymph node dissection. 2 each 0  . promethazine (PHENERGAN) 25 MG tablet Take 1 tablet (25 mg total) by mouth every 6 (six) hours as needed for nausea or vomiting. (Patient not taking: Reported on 07/07/2017) 30 tablet 1   No current facility-administered medications on file prior to visit.         Objective:   Physical Exam Blood pressure 124/82, pulse 64, temperature 98 F (36.7 C), height 5\' 5"  (1.651 m), weight 218 lb (98.9 kg). Alert and oriented. Skin warm and dry. Oral mucosa is moist.   . Sclera anicteric, conjunctivae is pink. Thyroid not enlarged. No cervical lymphadenopathy. Lungs clear. Heart regular rate and rhythm.  Abdomen is soft. Bowel sounds are positive. No hepatomegaly. No abdominal masses felt. No tenderness.  1+ edema to lower extremities.          Assessment & Plan:  GERD. Continue the Protonix. OV in 1 year.

## 2017-08-26 ENCOUNTER — Ambulatory Visit
Admission: RE | Admit: 2017-08-26 | Discharge: 2017-08-26 | Disposition: A | Discharge status: Home / Self Care | Payer: BLUE CROSS/BLUE SHIELD | Source: Ambulatory Visit | Attending: Radiation Oncology | Admitting: Radiation Oncology

## 2017-08-26 DIAGNOSIS — Z510 Encounter for antineoplastic radiation therapy: Secondary | ICD-10-CM | POA: Diagnosis not present

## 2017-08-27 ENCOUNTER — Ambulatory Visit
Admission: RE | Admit: 2017-08-27 | Discharge: 2017-08-27 | Disposition: A | Discharge status: Home / Self Care | Payer: BLUE CROSS/BLUE SHIELD | Source: Ambulatory Visit | Attending: Radiation Oncology | Admitting: Radiation Oncology

## 2017-08-27 DIAGNOSIS — Z510 Encounter for antineoplastic radiation therapy: Secondary | ICD-10-CM | POA: Diagnosis not present

## 2017-08-28 ENCOUNTER — Ambulatory Visit
Admission: RE | Admit: 2017-08-28 | Discharge: 2017-08-28 | Disposition: A | Discharge status: Home / Self Care | Payer: BLUE CROSS/BLUE SHIELD | Source: Ambulatory Visit | Attending: Radiation Oncology | Admitting: Radiation Oncology

## 2017-08-28 DIAGNOSIS — Z510 Encounter for antineoplastic radiation therapy: Secondary | ICD-10-CM | POA: Diagnosis not present

## 2017-08-29 ENCOUNTER — Ambulatory Visit
Admission: RE | Admit: 2017-08-29 | Discharge: 2017-08-29 | Disposition: A | Discharge status: Home / Self Care | Payer: BLUE CROSS/BLUE SHIELD | Source: Ambulatory Visit | Attending: Radiation Oncology | Admitting: Radiation Oncology

## 2017-08-29 ENCOUNTER — Ambulatory Visit: Payer: BLUE CROSS/BLUE SHIELD | Admitting: Radiation Oncology

## 2017-08-29 DIAGNOSIS — Z510 Encounter for antineoplastic radiation therapy: Secondary | ICD-10-CM | POA: Diagnosis not present

## 2017-09-01 ENCOUNTER — Ambulatory Visit
Admission: RE | Admit: 2017-09-01 | Discharge: 2017-09-01 | Disposition: A | Discharge status: Home / Self Care | Payer: BLUE CROSS/BLUE SHIELD | Source: Ambulatory Visit | Attending: Radiation Oncology | Admitting: Radiation Oncology

## 2017-09-01 ENCOUNTER — Other Ambulatory Visit: Payer: Self-pay | Admitting: Oncology

## 2017-09-01 ENCOUNTER — Encounter (HOSPITAL_COMMUNITY): Payer: Self-pay

## 2017-09-01 ENCOUNTER — Encounter (HOSPITAL_COMMUNITY): Payer: BLUE CROSS/BLUE SHIELD

## 2017-09-01 ENCOUNTER — Encounter (HOSPITAL_COMMUNITY): Payer: BLUE CROSS/BLUE SHIELD | Attending: Hematology & Oncology | Admitting: Oncology

## 2017-09-01 ENCOUNTER — Encounter (HOSPITAL_BASED_OUTPATIENT_CLINIC_OR_DEPARTMENT_OTHER): Payer: BLUE CROSS/BLUE SHIELD

## 2017-09-01 VITALS — BP 120/67 | HR 82 | Temp 97.8°F | Resp 16 | Wt 220.4 lb

## 2017-09-01 DIAGNOSIS — C50512 Malignant neoplasm of lower-outer quadrant of left female breast: Secondary | ICD-10-CM | POA: Insufficient documentation

## 2017-09-01 DIAGNOSIS — G620 Drug-induced polyneuropathy: Secondary | ICD-10-CM

## 2017-09-01 DIAGNOSIS — Z95828 Presence of other vascular implants and grafts: Secondary | ICD-10-CM

## 2017-09-01 DIAGNOSIS — Z171 Estrogen receptor negative status [ER-]: Secondary | ICD-10-CM | POA: Insufficient documentation

## 2017-09-01 DIAGNOSIS — C773 Secondary and unspecified malignant neoplasm of axilla and upper limb lymph nodes: Secondary | ICD-10-CM | POA: Diagnosis not present

## 2017-09-01 DIAGNOSIS — Z510 Encounter for antineoplastic radiation therapy: Secondary | ICD-10-CM | POA: Diagnosis not present

## 2017-09-01 LAB — CBC WITH DIFFERENTIAL/PLATELET
Basophils Absolute: 0 10*3/uL (ref 0.0–0.1)
Basophils Relative: 0 %
Eosinophils Absolute: 0.2 10*3/uL (ref 0.0–0.7)
Eosinophils Relative: 3 %
HEMATOCRIT: 38.9 % (ref 36.0–46.0)
HEMOGLOBIN: 12.7 g/dL (ref 12.0–15.0)
LYMPHS ABS: 1.4 10*3/uL (ref 0.7–4.0)
Lymphocytes Relative: 22 %
MCH: 30.5 pg (ref 26.0–34.0)
MCHC: 32.6 g/dL (ref 30.0–36.0)
MCV: 93.5 fL (ref 78.0–100.0)
MONO ABS: 0.5 10*3/uL (ref 0.1–1.0)
MONOS PCT: 8 %
NEUTROS ABS: 4.1 10*3/uL (ref 1.7–7.7)
NEUTROS PCT: 67 %
Platelets: 261 10*3/uL (ref 150–400)
RBC: 4.16 MIL/uL (ref 3.87–5.11)
RDW: 14.7 % (ref 11.5–15.5)
WBC: 6.1 10*3/uL (ref 4.0–10.5)

## 2017-09-01 LAB — COMPREHENSIVE METABOLIC PANEL
ALK PHOS: 95 U/L (ref 38–126)
ALT: 24 U/L (ref 14–54)
ANION GAP: 10 (ref 5–15)
AST: 25 U/L (ref 15–41)
Albumin: 4 g/dL (ref 3.5–5.0)
BILIRUBIN TOTAL: 0.7 mg/dL (ref 0.3–1.2)
BUN: 12 mg/dL (ref 6–20)
CALCIUM: 9.3 mg/dL (ref 8.9–10.3)
CO2: 25 mmol/L (ref 22–32)
Chloride: 104 mmol/L (ref 101–111)
Creatinine, Ser: 0.78 mg/dL (ref 0.44–1.00)
GLUCOSE: 127 mg/dL — AB (ref 65–99)
Potassium: 3.7 mmol/L (ref 3.5–5.1)
Sodium: 139 mmol/L (ref 135–145)
TOTAL PROTEIN: 7.2 g/dL (ref 6.5–8.1)

## 2017-09-01 MED ORDER — SODIUM CHLORIDE 0.9% FLUSH
10.0000 mL | INTRAVENOUS | Status: DC | PRN
  Administered 2017-09-01: 10 mL via INTRAVENOUS
  Filled 2017-09-01: qty 10

## 2017-09-01 MED ORDER — HEPARIN SOD (PORK) LOCK FLUSH 100 UNIT/ML IV SOLN
500.0000 [IU] | Freq: Once | INTRAVENOUS | Status: AC
  Administered 2017-09-01: 500 [IU] via INTRAVENOUS

## 2017-09-01 NOTE — Progress Notes (Signed)
Shannon Logan presented for Portacath access and flush. Portacath located right chest wall accessed with  H 20 needle. No blood return and flushed without difficulty. Portacath flushed with 54ml NS and 500U/9ml Heparin and needle removed intact. Procedure without incident. Patient tolerated procedure well. Patient tolerated treatment without incidence. Patient discharged ambulatory and in stable condition from clinic. Patient to follow up as scheduled.

## 2017-09-01 NOTE — Progress Notes (Signed)
[place] [address]  Malignant neoplasm of lower-outer quadrant of left breast of female, estrogen receptor negative ([place]) - Plan: MM DIAG BREAST TOMO BILATERAL, CBC with Differential, Comprehensive metabolic panel, Cancer antigen 15-3, Cancer antigen 27.29  CURRENT THERAPY: adjuvant RT  INTERVAL HISTORY: Shannon Logan 51 y.o. female returns for followup of Stage IIIA (pT2pN1AM0) invasive left breast cancer in the lower-outer quadrant, ER/PR/HER2 NEGATIVE, S/P left breast lumpectomy with positive margins by Dr. Ninfa Linden on 01/22/2017 with one positive left axillary lymph node positive for metastatic disease resulting in a re-excision on 01/30/2017 and left axillary lymph node dissection with invasive component focally 0.1- 0.2 cm away from anterior margin, but inked margin surface is negative and 0/6 lymph nodes.  Patient presents for follow up today. She states she is doing well. She is currently undergoing left breast adjuvant RT and she states that she will finish next week. She has been tolerating radiation well with some erythema of her left breast but no skin breakdown so far. She states her neuropathy has improved and she has tingling in her fingertips but they are no longer numb and painful. Her toenails have been falling off and her podiatrist just removed her left big toe nail because it was growing in curled. Her hair is growing back. Otherwise she has no complaints today.  Review of Systems  Constitutional: Negative.  Negative for chills, fever and weight loss.  HENT: Negative for nosebleeds.   Eyes: Negative.   Respiratory: Negative.  Negative for cough.   Cardiovascular: Negative for chest pain and leg swelling.  Gastrointestinal: Negative.  Negative for blood in stool, constipation, diarrhea, melena, nausea and vomiting.  Genitourinary: Negative.   Musculoskeletal: Negative.        Toenails falling off  Skin: Negative.     Neurological: Negative for weakness.       Grade 1 neuropathy in fingers/toes improving, not painful anymore  Endo/Heme/Allergies: Negative.   Psychiatric/Behavioral: Negative.     Past Medical History:  Diagnosis Date  . Breast cancer of lower-outer quadrant of left female breast ([place]) 01/03/2017  . Cancer ([place]) 11/22/2016   Breast   . Family history of breast cancer   . GERD (gastroesophageal reflux disease)   . Personal history of chemotherapy    from 3/18 till 7/18    Past Surgical History:  Procedure Laterality Date  . BREAST LUMPECTOMY Left 01/22/17 and 01/30/17  . BREAST LUMPECTOMY WITH AXILLARY LYMPH NODE DISSECTION Left 01/30/2017   Procedure: RE-EXCISION OF LEFT BREAST LUMPECTOMY AND LEFT AXILLARY LYMPH NODE DISSECTION;  Surgeon: Coralie Keens, MD;  Location: [facility];  Service: General;  Laterality: Left;  . BREAST LUMPECTOMY WITH RADIOACTIVE SEED AND SENTINEL LYMPH NODE BIOPSY Left 01/22/2017   Procedure: BREAST LUMPECTOMY WITH RADIOACTIVE SEED AND SENTINEL LYMPH NODE BIOPSY;  Surgeon: Coralie Keens, MD;  Location: [facility];  Service: General;  Laterality: Left;  . COLONOSCOPY N/A 10/26/2015   Procedure: COLONOSCOPY;  Surgeon: Rogene Houston, MD;  [facility];  Service: Endoscopy;  Laterality: N/A;  . DILATION AND CURETTAGE OF UTERUS    . ESOPHAGOGASTRODUODENOSCOPY N/A 10/26/2015   Procedure: ESOPHAGOGASTRODUODENOSCOPY (EGD);  Surgeon: Rogene Houston, MD;  [facility];  Service: Endoscopy;  Laterality: N/A;  1:00  . PORTACATH PLACEMENT Right 01/22/2017   Procedure: INSERTION PORT-A-CATH;  Surgeon: Coralie Keens, MD;  Location: [place];  Service:  General;  Laterality: Right;  . URETHRAL DILATION      Family History  Problem Relation Age of Onset  . Hypertension Mother   . Hyperlipidemia Mother   . Heart disease Father   . Hypertension Father   . Diabetes Father   . Aneurysm Maternal  Grandmother   . Stroke Maternal Grandfather   . Heart disease Paternal Grandfather   . Breast cancer Other        PGF's sister  . Breast cancer Other        PGF's sister  . Breast cancer Other        PGF's aunt (great, great aunt)  . Breast cancer Other        PGM's mother    Social History   Social History  . Marital status: Married    Spouse name: [name]  . Number of children: 3  . Years of education: N/A   Social History Main Topics  . Smoking status: Former Smoker    Quit date: 07/23/2008  . Smokeless tobacco: Never Used     Comment: quit smoking 07/23/2008 after smoking 30 yrs. (1/2 pack a day)  . Alcohol use 0.0 oz/week     Comment: rare  . Drug use: No  . Sexual activity: Yes   Other Topics Concern  . None   Social History Narrative  . None     PHYSICAL EXAMINATION  ECOG PERFORMANCE STATUS: 1 - Symptomatic but completely ambulatory  Vitals:   09/01/17 1426  BP: 120/67  Pulse: 82  Resp: 16  Temp: 97.8 F (36.6 C)  SpO2: 99%    Vitals - 1 value per visit 0/25/4270  SYSTOLIC 623  DIASTOLIC 76  Pulse 92  Temperature 98  Respirations 18  Weight (lb) 216   Physical Exam  Constitutional: She is oriented to person, place, and time. She appears well-developed and well-nourished. No distress.  HENT:  Head: Normocephalic and atraumatic.  Mouth/Throat: No oropharyngeal exudate.  Eyes: Pupils are equal, round, and reactive to light. EOM are normal. No scleral icterus.  Neck: Normal range of motion. Neck supple.  Cardiovascular: Normal rate and regular rhythm.  Exam reveals no gallop and no friction rub.   No murmur heard. Pulmonary/Chest: Effort normal and breath sounds normal. No respiratory distress.  Abdominal: Soft. Bowel sounds are normal. She exhibits no distension.  Musculoskeletal: Normal range of motion. She exhibits no edema.  Neurological: She is alert and oriented to person, place, and time. No cranial nerve deficit.  Skin: Skin is warm and dry.  No erythema.  Psychiatric: She has a normal mood and affect. Her behavior is normal. Judgment and thought content normal.  BREAST: Left breast: erythema over skin of left breast, inverted nipple, no discharge. Mild skin dimpling around the areola. No masses or axillary lymphadenopathy palpated. Right breast: No nipple changes no inversion, no skin changes. No masses or axillary lymphadenopathy palpated.    LABORATORY DATA: CBC    Component Value Date/Time   WBC 5.7 06/30/2017 0951   RBC 3.35 (L) 06/30/2017 0951   HGB 11.2 (L) 06/30/2017 0951   HCT 34.2 (L) 06/30/2017 0951   PLT 285 06/30/2017 0951   MCV 102.1 (H) 06/30/2017 0951   MCV 90.2 07/02/2013 1523   MCH 33.4 06/30/2017 0951   MCHC 32.7 06/30/2017 0951   RDW 14.7 06/30/2017 0951   LYMPHSABS 1.2 06/30/2017 0951   MONOABS 0.4 06/30/2017 0951   EOSABS 0.1 06/30/2017 0951   BASOSABS 0.0 06/30/2017 7628  Chemistry      Component Value Date/Time   NA 139 06/30/2017 0951   NA 144 10/25/2016 0910   K 4.2 06/30/2017 0951   CL 110 06/30/2017 0951   CO2 24 06/30/2017 0951   BUN 11 06/30/2017 0951   BUN 13 10/25/2016 0910   CREATININE 0.77 06/30/2017 0951   CREATININE 0.78 07/02/2013 1448      Component Value Date/Time   CALCIUM 8.9 06/30/2017 0951   ALKPHOS 78 06/30/2017 0951   AST 25 06/30/2017 0951   ALT 26 06/30/2017 0951   BILITOT 0.6 06/30/2017 0951   BILITOT 0.9 10/25/2016 0910        ASSESSMENT AND PLAN:  Stage IIIA (pT2pN1AM0) left breast cancer in the lower-outer quadrant, ER/PR/HER2 NEGATIVE, S/P left lumpectomy with positive margins by Dr. Ninfa Linden on 01/22/2017 with one positive left axillary node with positive for metastatic disease resulting in a re-excision on 01/30/2017 and left axillary lymph node dissection with invasive component focally 0.1-0.2 cm away from anterior margin but inked margin surface is negative and 0/6 benign lymph nodes. Adjuvant dose dense AC q2weeks x4 cycles followed by weekly  taxol x 12 cycles from 02/13/17-06/30/17.  Clinically NED on breast exam today except for some erythema over the skin of her left breast. Continue with adjuvant RT under the care of Dr. Isidore Moos. I have ordered her bilateral diagnostic mammograms for December 2018. Port flush today and q8 weeks. I have told patient that we will keep the port in for at least 1 year in case she has a recurrence. RTC in 3 months for follow up with labs.  All questions were answered. The patient knows to call the clinic with any problems, questions or concerns. We can certainly see the patient much sooner if necessary.    This note is electronically signed by: Twana First, MD 09/01/2017 2:49 PM

## 2017-09-01 NOTE — Patient Instructions (Signed)
[place] at [facility] Discharge Instructions  RECOMMENDATIONS MADE BY THE CONSULTANT AND ANY TEST RESULTS WILL BE SENT TO YOUR REFERRING PHYSICIAN.  You had your port flushed today, continue getting it flushed every 8 weeks. Follow up as scheduled.  Thank you for choosing [place] at [facility] to provide your oncology and hematology care.  To afford each patient quality time with our provider, please arrive at least 15 minutes before your scheduled appointment time.    If you have a lab appointment with the [facility] please come in thru the  Main Entrance and check in at the main information desk  You need to re-schedule your appointment should you arrive 10 or more minutes late.  We strive to give you quality time with our providers, and arriving late affects you and other patients whose appointments are after yours.  Also, if you no show three or more times for appointments you may be dismissed from the clinic at the providers discretion.     Again, thank you for choosing [facility].  Our hope is that these requests will decrease the amount of time that you wait before being seen by our physicians.       _____________________________________________________________  Should you have questions after your visit to [facility], please contact our office at [phone] between the hours of 8:30 a.m. and 4:30 p.m.  Voicemails left after 4:30 p.m. will not be returned until the following business day.  For prescription refill requests, have your pharmacy contact our office.       Resources For Cancer Patients and their Caregivers ? American Cancer Society: Can assist with transportation, wigs, general needs, runs Look Good Feel Better.        (386)099-3842 ? Cancer Care: Provides financial assistance, online support groups, medication/co-pay assistance.  1-800-813-HOPE 364-751-5298) ? [facility] Assists [place] cancer patients and their families through emotional , educational and financial support.  904 044 1878 ? [place] DSS Where to apply for food stamps, Medicaid and utility assistance. 818-636-6329 ? [facility]: Transportation to medical appointments. 310-847-4414 ? Social Security Administration: May apply for disability if have a Stage IV cancer. (310)253-3663 949-461-0845 ? [employer], Disability and Transit Services: Assists with nutrition, care and transit needs. [place] Support Programs: @10RELATIVEDAYS @ > Cancer Support Group  2nd Tuesday of the month 1pm-2pm, Journey Room  > Creative Journey  3rd Tuesday of the month 1130am-1pm, Journey Room  > Look Good Feel Better  1st Wednesday of the month 10am-12 noon, Journey Room (Call [place] to register [phone])

## 2017-09-01 NOTE — Patient Instructions (Signed)
[facility] at [facility] Discharge Instructions  RECOMMENDATIONS MADE BY THE CONSULTANT AND ANY TEST RESULTS WILL BE SENT TO YOUR REFERRING PHYSICIAN.  You saw Dr. Zhou today.  Thank you for choosing [facility] at [facility] to provide your oncology and hematology care.  To afford each patient quality time with our provider, please arrive at least 15 minutes before your scheduled appointment time.    If you have a lab appointment with the Cancer Center please come in thru the  Main Entrance and check in at the main information desk  You need to re-schedule your appointment should you arrive 10 or more minutes late.  We strive to give you quality time with our providers, and arriving late affects you and other patients whose appointments are after yours.  Also, if you no show three or more times for appointments you may be dismissed from the clinic at the providers discretion.     Again, thank you for choosing [facility].  Our hope is that these requests will decrease the amount of time that you wait before being seen by our physicians.       _____________________________________________________________  Should you have questions after your visit to [facility], please contact our office at [phone] between the hours of 8:30 a.m. and 4:30 p.m.  Voicemails left after 4:30 p.m. will not be returned until the following business day.  For prescription refill requests, have your pharmacy contact our office.       Resources For Cancer Patients and their Caregivers ? American Cancer Society: Can assist with transportation, wigs, general needs, runs Look Good Feel Better.        1-888-227-6333 ? Cancer Care: Provides financial assistance, online support groups, medication/co-pay assistance.  1-800-813-HOPE (4673) ? [facility] Assists [place] cancer patients and their families through  emotional , educational and financial support.  336-427-4357 ? [place] DSS Where to apply for food stamps, Medicaid and utility assistance. 336-342-1394 ? [facility]: Transportation to medical appointments. 336-347-2287 ? Social Security Administration: May apply for disability if have a Stage IV cancer. 336-342-7796 1-800-772-1213 ? [place] Aging, Disability and Transit Services: Assists with nutrition, care and transit needs. 336-349-2343  Cancer Center Support Programs: @10RELATIVEDAYS@ > Cancer Support Group  2nd Tuesday of the month 1pm-2pm, Journey Room  > Creative Journey  3rd Tuesday of the month 1130am-1pm, Journey Room  > Look Good Feel Better  1st Wednesday of the month 10am-12 noon, Journey Room (Call American Cancer Society to register 1-800-395-5775)    

## 2017-09-02 ENCOUNTER — Ambulatory Visit
Admission: RE | Admit: 2017-09-02 | Discharge: 2017-09-02 | Disposition: A | Discharge status: Home / Self Care | Payer: BLUE CROSS/BLUE SHIELD | Source: Ambulatory Visit | Attending: Radiation Oncology | Admitting: Radiation Oncology

## 2017-09-02 DIAGNOSIS — Z510 Encounter for antineoplastic radiation therapy: Secondary | ICD-10-CM | POA: Diagnosis not present

## 2017-09-03 ENCOUNTER — Ambulatory Visit
Admission: RE | Admit: 2017-09-03 | Discharge: 2017-09-03 | Disposition: A | Discharge status: Home / Self Care | Payer: BLUE CROSS/BLUE SHIELD | Source: Ambulatory Visit | Attending: Radiation Oncology | Admitting: Radiation Oncology

## 2017-09-03 DIAGNOSIS — Z510 Encounter for antineoplastic radiation therapy: Secondary | ICD-10-CM | POA: Diagnosis not present

## 2017-09-04 ENCOUNTER — Ambulatory Visit
Admission: RE | Admit: 2017-09-04 | Discharge: 2017-09-04 | Disposition: A | Discharge status: Home / Self Care | Payer: BLUE CROSS/BLUE SHIELD | Source: Ambulatory Visit | Attending: Radiation Oncology | Admitting: Radiation Oncology

## 2017-09-04 DIAGNOSIS — Z510 Encounter for antineoplastic radiation therapy: Secondary | ICD-10-CM | POA: Diagnosis not present

## 2017-09-05 ENCOUNTER — Ambulatory Visit
Admission: RE | Admit: 2017-09-05 | Discharge: 2017-09-05 | Disposition: A | Discharge status: Home / Self Care | Payer: BLUE CROSS/BLUE SHIELD | Source: Ambulatory Visit | Attending: Radiation Oncology | Admitting: Radiation Oncology

## 2017-09-05 DIAGNOSIS — Z510 Encounter for antineoplastic radiation therapy: Secondary | ICD-10-CM | POA: Diagnosis not present

## 2017-09-08 ENCOUNTER — Ambulatory Visit
Admission: RE | Admit: 2017-09-08 | Discharge: 2017-09-08 | Disposition: A | Discharge status: Home / Self Care | Payer: BLUE CROSS/BLUE SHIELD | Source: Ambulatory Visit | Attending: Radiation Oncology | Admitting: Radiation Oncology

## 2017-09-08 DIAGNOSIS — Z510 Encounter for antineoplastic radiation therapy: Secondary | ICD-10-CM | POA: Diagnosis not present

## 2017-09-09 ENCOUNTER — Ambulatory Visit
Admission: RE | Admit: 2017-09-09 | Discharge: 2017-09-09 | Disposition: A | Discharge status: Home / Self Care | Payer: BLUE CROSS/BLUE SHIELD | Source: Ambulatory Visit | Attending: Radiation Oncology | Admitting: Radiation Oncology

## 2017-09-09 DIAGNOSIS — Z510 Encounter for antineoplastic radiation therapy: Secondary | ICD-10-CM | POA: Diagnosis not present

## 2017-09-10 ENCOUNTER — Ambulatory Visit
Admission: RE | Admit: 2017-09-10 | Discharge: 2017-09-10 | Disposition: A | Discharge status: Home / Self Care | Payer: BLUE CROSS/BLUE SHIELD | Source: Ambulatory Visit | Attending: Radiation Oncology | Admitting: Radiation Oncology

## 2017-09-10 DIAGNOSIS — Z510 Encounter for antineoplastic radiation therapy: Secondary | ICD-10-CM | POA: Diagnosis not present

## 2017-09-11 ENCOUNTER — Ambulatory Visit: Payer: BLUE CROSS/BLUE SHIELD

## 2017-09-12 ENCOUNTER — Ambulatory Visit: Payer: BLUE CROSS/BLUE SHIELD

## 2017-09-15 ENCOUNTER — Ambulatory Visit: Payer: BLUE CROSS/BLUE SHIELD

## 2017-09-16 ENCOUNTER — Ambulatory Visit: Payer: BLUE CROSS/BLUE SHIELD

## 2017-09-17 ENCOUNTER — Encounter: Payer: Self-pay | Admitting: Radiation Oncology

## 2017-09-17 NOTE — Progress Notes (Signed)
  Radiation Oncology         (336) [phone] ________________________________  Name: Shannon Logan MRN: [id number]  Date: 09/17/2017  DOB: 07-Jan-1966  End of Treatment Note  Diagnosis:  Breast cancer of lower-outer quadrant of left female breast [facility]) Staging form: Breast, AJCC 8th Edition - Pathologic stage from 02/05/2017: Stage IIIA (pT2, pN1a, cM0, G3, ER: Negative, PR: Negative, HER2: Negative)   Indication for treatment:  Curative        Radiation treatment dates:  07/30/17-09/10/17  Site/dose:   1. Breast_Lt, 50 Gy in 25 fractions    2. Breast_Lt Boost, 10 Gy in 5 fractions   Beams/energy:   1. 3D, 6X, 15X, 10X     2. 3D, 6X, 15X  Narrative: The patient tolerated radiation treatment relatively well. Inially, she only developed some pinkness overlying her radiation field and she was given radiaplex to assist with this. As her treatment continued she developed itching, pain, peeling, and continued erythema over the left breast at the site of her radiation. She continued to use radiaplex with adequate control of this. Pt was otherwise without acute concerns at the end of her treatment.   Plan: The patient has completed radiation treatment. The patient will return to radiation oncology clinic for routine followup in one month. I advised them to call or return sooner if they have any questions or concerns related to their recovery or treatment.  -----------------------------------  Eppie Gibson, MD  This document serves as a record of services personally performed by Eppie Gibson, MD. It was created on his behalf by Reola Mosher, a trained medical scribe. The creation of this record is based on the scribe's personal observations and the provider's statements to them. This document has been checked and approved by the attending provider.

## 2017-10-09 ENCOUNTER — Encounter: Payer: Self-pay | Admitting: Radiation Oncology

## 2017-10-14 ENCOUNTER — Ambulatory Visit
Admission: RE | Admit: 2017-10-14 | Discharge: 2017-10-14 | Disposition: A | Discharge status: Home / Self Care | Payer: BLUE CROSS/BLUE SHIELD | Source: Ambulatory Visit | Attending: Radiation Oncology | Admitting: Radiation Oncology

## 2017-10-14 ENCOUNTER — Encounter: Payer: Self-pay | Admitting: Radiation Oncology

## 2017-10-14 DIAGNOSIS — Z9221 Personal history of antineoplastic chemotherapy: Secondary | ICD-10-CM | POA: Insufficient documentation

## 2017-10-14 DIAGNOSIS — R040 Epistaxis: Secondary | ICD-10-CM | POA: Insufficient documentation

## 2017-10-14 DIAGNOSIS — Z886 Allergy status to analgesic agent status: Secondary | ICD-10-CM | POA: Diagnosis not present

## 2017-10-14 DIAGNOSIS — Z510 Encounter for antineoplastic radiation therapy: Secondary | ICD-10-CM | POA: Insufficient documentation

## 2017-10-14 DIAGNOSIS — M7989 Other specified soft tissue disorders: Secondary | ICD-10-CM | POA: Diagnosis not present

## 2017-10-14 DIAGNOSIS — C50512 Malignant neoplasm of lower-outer quadrant of left female breast: Secondary | ICD-10-CM | POA: Insufficient documentation

## 2017-10-14 DIAGNOSIS — Z888 Allergy status to other drugs, medicaments and biological substances status: Secondary | ICD-10-CM | POA: Diagnosis not present

## 2017-10-14 DIAGNOSIS — Z882 Allergy status to sulfonamides status: Secondary | ICD-10-CM | POA: Diagnosis not present

## 2017-10-14 DIAGNOSIS — Z171 Estrogen receptor negative status [ER-]: Secondary | ICD-10-CM | POA: Diagnosis not present

## 2017-10-14 HISTORY — DX: Personal history of irradiation: Z92.3

## 2017-10-14 NOTE — Progress Notes (Signed)
Shannon Logan presents for follow up of radiation completed 09/10/17 to her Left Breast. She denies pain. She has some mild fatigue. She last saw Dr. Talbert Cage 09/01/17. She has minor peeling to her Left nipple. She has redness to her Left Breast and Left clavicle area. She continues to use radiaplex to her radiation site. She reports pain to her Left mid back area on palpation.   BP 133/80   Pulse 77   Temp 98.3 F (36.8 C)   Ht 5\' 5"  (1.651 m)   Wt 214 lb 12.8 oz (97.4 kg)   SpO2 98% Comment: room air  BMI 35.74 kg/m    Wt Readings from Last 3 Encounters:  10/14/17 214 lb 12.8 oz (97.4 kg)  09/01/17 220 lb 6.4 oz (100 kg)  08/25/17 218 lb (98.9 kg)

## 2017-10-14 NOTE — Progress Notes (Signed)
  Radiation Oncology         (336) [phone] ________________________________  Name: Shannon Logan MRN: [id number]  Date: 10/14/2017  DOB: 09/10/1966  Follow-Up Visit Note  Outpatient  CC: [name], MD  Twana First, MD  Diagnosis:  Pathologic Stage from 02/05/2017: Stage IIIA (pT2, pN1a, cM0, G3, ER: Negative, PR: Negative, HER2: Negative) Breast Cancer of lower-outer quadrant of left female breast    ICD-10-CM   1. Malignant neoplasm of lower-outer quadrant of left breast of female, estrogen receptor negative ([place]) C50.512    Z17.1     Previous Radiation:  50 Gy in 25 fractions, followed by 10 Gy boost in 5 fractions, Completed on 09/10/2017  Narrative:  The patient returns today for routine follow-up, accompanied by her husband.  She is doing well and continues to use radiaplex to the radiation site. She has some mild fatigue and occasional shooting pains in her left breast. She denies any new palpable lumps or bumps in her breasts. She continues to be followed by medical oncology and last saw Dr. Talbert Cage on 09/01/2017. No additional complaints today.                              ALLERGIES:  is allergic to asa [aspirin]; decadron [dexamethasone]; prednisone; and sulfa antibiotics.  Meds: Current Outpatient Prescriptions  Medication Sig Dispense Refill  . ALLERGY 10 MG tablet Take 1 Tablet by mouth once daily 30 tablet 3  . hyaluronate sodium (RADIAPLEXRX) GEL Apply 1 application topically 2 (two) times daily.    . Ibuprofen (MOTRIN PO) Take by mouth.    . lidocaine-prilocaine (EMLA) cream Apply to affected area once 30 g 3  . pantoprazole (PROTONIX) 40 MG tablet Take 1 tablet (40 mg total) by mouth daily. 90 tablet 3   No current facility-administered medications for this encounter.    Review of Systems:n as above  Physical Findings:  height is _0  (1.651 m) and weight is 214 lb 12.8 oz (97.4 kg). Her temperature is 98.3 F (36.8 C). Her blood pressure is 133/80 and  her pulse is 77. Her oxygen saturation is 98%.   BREASTS: She has some residual hyperpigmentation over the left breast and dryness at her nipple.  SKIN: healed well in RT field.  Lab Findings: Lab Results  Component Value Date   WBC 6.1 09/01/2017   HGB 12.7 09/01/2017   HCT 38.9 09/01/2017   MCV 93.5 09/01/2017   PLT 261 09/01/2017    _1 @  Radiographic Findings: No results found.  Impression/Plan: This is a very pleasant woman with a history of left breast cancer.  She knows to continue yearly mammography and routine follow-up with Dr. Talbert Cage. I will see her back prn. I advised her to apply Vitamin E lotion to her radiation site twice a day over the next few months to promote further skin healing. I encouraged her to call if she has any issues in the interim.       Eppie Gibson, MD  This document serves as a record of services personally performed by Eppie Gibson, MD. It was created on her behalf by Rae Lips, a trained medical scribe. The creation of this record is based on the scribe's personal observations and the provider's statements to them. This document has been checked and approved by the attending provider.

## 2017-10-27 ENCOUNTER — Encounter (HOSPITAL_COMMUNITY): Payer: Self-pay | Admitting: Oncology

## 2017-10-28 ENCOUNTER — Other Ambulatory Visit (HOSPITAL_COMMUNITY): Payer: BLUE CROSS/BLUE SHIELD

## 2017-10-28 ENCOUNTER — Encounter (HOSPITAL_COMMUNITY): Payer: Self-pay

## 2017-10-28 ENCOUNTER — Encounter (HOSPITAL_COMMUNITY): Payer: BLUE CROSS/BLUE SHIELD | Attending: Hematology & Oncology

## 2017-10-28 ENCOUNTER — Encounter (HOSPITAL_COMMUNITY): Payer: BLUE CROSS/BLUE SHIELD

## 2017-10-28 VITALS — BP 132/76 | HR 86 | Temp 98.3°F | Resp 16

## 2017-10-28 DIAGNOSIS — Z171 Estrogen receptor negative status [ER-]: Principal | ICD-10-CM

## 2017-10-28 DIAGNOSIS — C50512 Malignant neoplasm of lower-outer quadrant of left female breast: Secondary | ICD-10-CM

## 2017-10-28 DIAGNOSIS — Z452 Encounter for adjustment and management of vascular access device: Secondary | ICD-10-CM | POA: Diagnosis not present

## 2017-10-28 LAB — CBC WITH DIFFERENTIAL/PLATELET
Basophils Absolute: 0 10*3/uL (ref 0.0–0.1)
Basophils Relative: 0 %
EOS ABS: 0.1 10*3/uL (ref 0.0–0.7)
EOS PCT: 2 %
HCT: 39.4 % (ref 36.0–46.0)
Hemoglobin: 12.9 g/dL (ref 12.0–15.0)
LYMPHS ABS: 1.7 10*3/uL (ref 0.7–4.0)
Lymphocytes Relative: 25 %
MCH: 29.6 pg (ref 26.0–34.0)
MCHC: 32.7 g/dL (ref 30.0–36.0)
MCV: 90.4 fL (ref 78.0–100.0)
MONO ABS: 0.4 10*3/uL (ref 0.1–1.0)
Monocytes Relative: 6 %
Neutro Abs: 4.6 10*3/uL (ref 1.7–7.7)
Neutrophils Relative %: 67 %
PLATELETS: 229 10*3/uL (ref 150–400)
RBC: 4.36 MIL/uL (ref 3.87–5.11)
RDW: 14.8 % (ref 11.5–15.5)
WBC: 6.8 10*3/uL (ref 4.0–10.5)

## 2017-10-28 LAB — COMPREHENSIVE METABOLIC PANEL
ALT: 25 U/L (ref 14–54)
ANION GAP: 7 (ref 5–15)
AST: 27 U/L (ref 15–41)
Albumin: 3.9 g/dL (ref 3.5–5.0)
Alkaline Phosphatase: 102 U/L (ref 38–126)
BUN: 12 mg/dL (ref 6–20)
CHLORIDE: 103 mmol/L (ref 101–111)
CO2: 26 mmol/L (ref 22–32)
Calcium: 9 mg/dL (ref 8.9–10.3)
Creatinine, Ser: 0.76 mg/dL (ref 0.44–1.00)
Glucose, Bld: 116 mg/dL — ABNORMAL HIGH (ref 65–99)
POTASSIUM: 3.7 mmol/L (ref 3.5–5.1)
SODIUM: 136 mmol/L (ref 135–145)
Total Bilirubin: 0.8 mg/dL (ref 0.3–1.2)
Total Protein: 6.9 g/dL (ref 6.5–8.1)

## 2017-10-28 MED ORDER — HEPARIN SOD (PORK) LOCK FLUSH 100 UNIT/ML IV SOLN
INTRAVENOUS | Status: AC
  Filled 2017-10-28: qty 5

## 2017-10-28 MED ORDER — HEPARIN SOD (PORK) LOCK FLUSH 100 UNIT/ML IV SOLN
500.0000 [IU] | Freq: Once | INTRAVENOUS | Status: AC
Start: 2017-10-28 — End: 2017-10-28
  Administered 2017-10-28: 500 [IU] via INTRAVENOUS

## 2017-10-28 MED ORDER — SODIUM CHLORIDE 0.9% FLUSH
10.0000 mL | Freq: Once | INTRAVENOUS | Status: AC
Start: 2017-10-28 — End: 2017-10-28
  Administered 2017-10-28: 10 mL via INTRAVENOUS

## 2017-10-28 NOTE — Progress Notes (Signed)
Port flushed per protocol.  Port site clean and dry with no bruising or swelling noted at site.  No blood return noted and no complaints of pain with flush.  Band aid applied.  VSS with discharge and left ambulatory.

## 2017-10-28 NOTE — Patient Instructions (Signed)
[facility] at [facility]  Discharge Instructions:  Your port was flushed today. _______________________________________________________________  Thank you for choosing [facility] at [facility] to provide your oncology and hematology care.  To afford each patient quality time with our providers, please arrive at least 15 minutes before your scheduled appointment.  You need to re-schedule your appointment if you arrive 10 or more minutes late.  We strive to give you quality time with our providers, and arriving late affects you and other patients whose appointments are after yours.  Also, if you no show three or more times for appointments you may be dismissed from the clinic.  Again, thank you for choosing [facility]. Our hope is that these requests will allow you access to exceptional care and in a timely manner. _______________________________________________________________  If you have questions after your visit, please contact our office at [phone] between the hours of 8:30 a.m. and 5:00 p.m. Voicemails left after 4:30 p.m. will not be returned until the following business day. _______________________________________________________________  For prescription refill requests, have your pharmacy contact our office. _______________________________________________________________  Recommendations made by the consultant and any test results will be sent to your referring physician. _______________________________________________________________ 

## 2017-10-29 LAB — CANCER ANTIGEN 15-3: CAN 15 3: 23 U/mL (ref 0.0–25.0)

## 2017-10-29 LAB — CANCER ANTIGEN 27.29: CA 27.29: 25 U/mL (ref 0.0–38.6)

## 2017-10-31 ENCOUNTER — Ambulatory Visit: Payer: BLUE CROSS/BLUE SHIELD | Admitting: Pediatrics

## 2017-10-31 ENCOUNTER — Encounter: Payer: Self-pay | Admitting: Pediatrics

## 2017-10-31 VITALS — Temp 97.6°F | Ht 65.0 in | Wt 215.4 lb

## 2017-10-31 DIAGNOSIS — Z23 Encounter for immunization: Secondary | ICD-10-CM | POA: Diagnosis not present

## 2017-10-31 DIAGNOSIS — R0989 Other specified symptoms and signs involving the circulatory and respiratory systems: Secondary | ICD-10-CM

## 2017-10-31 DIAGNOSIS — R198 Other specified symptoms and signs involving the digestive system and abdomen: Secondary | ICD-10-CM

## 2017-10-31 DIAGNOSIS — R200 Anesthesia of skin: Secondary | ICD-10-CM

## 2017-10-31 DIAGNOSIS — F458 Other somatoform disorders: Secondary | ICD-10-CM

## 2017-10-31 NOTE — Progress Notes (Signed)
Subjective:   Patient ID: Shannon Logan, female    DOB: April 04, 1966, 51 y.o.   MRN: [id number] CC: Thyroid Check (Per ENT)  HPI: Shannon Logan is a 51 y.o. female presenting for Thyroid Check (Per ENT)  Numbness tongue and lips that comes and goes Sometimes down to her chin Sometimes across nose, around eyes One side compared to the other side is not affected more Symptoms are always bilateral Has some bilateral hand tingling that she thinks is from the chemotherapy The other symptoms she thinks have been going on longer than her breast cancer treatment  She does sometimes wake herself up because she cannot breathe in the middle the night Feels like her throat is closed but wants she is awake she is able to breathe just fine She gained some weight while on chemotherapy, has noticed symptoms improving since losing about 15 pounds from her max weight  She does sometimes have the feeling that something is caught in her throat Not there all the time Had EGD with GI within the past two years Started on Protonix for some irritation in her esophagus  She takes over-the-counter Claritin daily for allergy symptoms  Does not feel overly cold or overly hot No heart palpitations Feels well rested when she wakes up in the morning Sometimes tired during the day, does not fall asleep easily during the day  Family does tell her she snores some  Relevant past medical, surgical, family and social history reviewed. Allergies and medications reviewed and updated. Social History   Tobacco Use  Smoking Status Former Smoker  . Last attempt to quit: 07/23/2008  . Years since quitting: 9.2  Smokeless Tobacco Never Used  Tobacco Comment   quit smoking 07/23/2008 after smoking 30 yrs. (1/2 pack a day)   ROS: Per HPI   Objective:    Temp 97.6 F (36.4 C) (Oral)   Ht 5\' 5"  (1.651 m)   Wt 215 lb 6.4 oz (97.7 kg)   BMI 35.84 kg/m   Wt Readings from Last 3 Encounters:  10/31/17 215 lb 6.4 oz  (97.7 kg)  10/14/17 214 lb 12.8 oz (97.4 kg)  09/01/17 220 lb 6.4 oz (100 kg)    Gen: NAD, alert, cooperative with exam, NCAT EYES: EOMI, no conjunctival injection, or no icterus ENT:  TMs pearly gray b/l, OP without erythema, no lesions LYMPH: no cervical LAD Neck: Normal thyroid CV: NRRR, normal S1/S2, no murmur, distal pulses 2+ b/l Resp: CTABL, no wheezes, normal WOB Abd: +BS, soft, NTND. no guarding or organomegaly Ext: No edema, warm Neuro: Alert and oriented, strength equal b/l UE and LE, coordination grossly normal, face bilateral with decreased sensation across nose and cheeks  Assessment & Plan:  [name] was seen today for thyroid check.  Diagnoses and all orders for this visit:  Facial numbness Ongoing, no worsening over the last few months We will check labs Patient has follow-up appointment with ENT, she mentioned possibly getting an MRI in the future for further evaluation -     Vitamin B12 -     TSH -     VITAMIN D 25 Hydroxy (Vit-D Deficiency, Fractures)  Globus sensation Ongoing Recent EGD with irritation noted Started on PPI, some improvement in symptoms Does have some symptoms of sleep apnea and her history that was not overly tired during the day now and feels well rested when she wakes up Has improved since weight loss over the last couple of months Reasonable to continue weight loss  rather than do sleep study at this point  Need for immunization against influenza -     Flu Vaccine QUAD 36+ mos IM   Follow up plan: 3 mo Assunta Found, MD [place]

## 2017-11-01 LAB — TSH: TSH: 3.86 u[IU]/mL (ref 0.450–4.500)

## 2017-11-01 LAB — VITAMIN D 25 HYDROXY (VIT D DEFICIENCY, FRACTURES): Vit D, 25-Hydroxy: 28.5 ng/mL — ABNORMAL LOW (ref 30.0–100.0)

## 2017-11-01 LAB — VITAMIN B12: Vitamin B-12: 357 pg/mL (ref 232–1245)

## 2017-11-14 ENCOUNTER — Encounter (HOSPITAL_BASED_OUTPATIENT_CLINIC_OR_DEPARTMENT_OTHER): Payer: BLUE CROSS/BLUE SHIELD | Admitting: Oncology

## 2017-11-14 ENCOUNTER — Encounter (HOSPITAL_COMMUNITY): Payer: Self-pay | Admitting: Oncology

## 2017-11-14 VITALS — BP 127/70 | HR 77 | Temp 98.2°F | Resp 18 | Wt 217.4 lb

## 2017-11-14 DIAGNOSIS — Z171 Estrogen receptor negative status [ER-]: Secondary | ICD-10-CM

## 2017-11-14 DIAGNOSIS — C50512 Malignant neoplasm of lower-outer quadrant of left female breast: Secondary | ICD-10-CM

## 2017-11-14 DIAGNOSIS — G620 Drug-induced polyneuropathy: Secondary | ICD-10-CM | POA: Diagnosis not present

## 2017-11-14 DIAGNOSIS — R200 Anesthesia of skin: Secondary | ICD-10-CM | POA: Diagnosis not present

## 2017-11-14 DIAGNOSIS — C773 Secondary and unspecified malignant neoplasm of axilla and upper limb lymph nodes: Secondary | ICD-10-CM | POA: Diagnosis not present

## 2017-11-14 NOTE — Progress Notes (Signed)
Shannon Logan, [place] [address]  No diagnosis found.   CURRENT THERAPY: adjuvant RT  INTERVAL HISTORY: Shannon Logan 51 y.o. female returns for followup of Stage IIIA (pT2pN1AM0) invasive left breast cancer in the lower-outer quadrant, ER/PR/HER2 NEGATIVE, S/P left breast lumpectomy with positive margins by Dr. Ninfa Linden on 01/22/2017 with one positive left axillary lymph node positive for metastatic disease resulting in a re-excision on 01/30/2017 and left axillary lymph node dissection with invasive component focally 0.1- 0.2 cm away from anterior margin, but inked margin surface is negative and 0/6 lymph nodes.  Patient presents for follow up today. She has completed RT.  She received 50 Gy in 25 fractions, followed by 10 Gy boost in 5 fractions, completed on 09/10/2017 under the care of Dr. Isidore Moos. She states that she has been seeing ENT for facial numbness.  She has had this facial numbness from prior to her diagnosis of breast cancer or any treatment that she received for breast cancer.  She currently has numbness on her cheeks as well as perioral and occasionally on her tongue as well.  Otherwise she states she has been doing well.  She denies any chest pain, shortness of breath, abdominal pain, nausea, vomiting, diarrhea.  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  She states her appetite and energy levels at 100%.  Review of Systems  Constitutional: Negative.  Negative for chills, fever and weight loss.  HENT: Negative for nosebleeds.   Eyes: Negative.   Respiratory: Negative.  Negative for cough.   Cardiovascular: Negative for chest pain and leg swelling.  Gastrointestinal: Negative.  Negative for blood in stool, constipation, diarrhea, melena, nausea and vomiting.  Genitourinary: Negative.   Musculoskeletal: Negative.   Skin: Negative.   Neurological: Negative for weakness.       Grade 1 neuropathy in fingers/toes  stable.  Endo/Heme/Allergies: Negative.   Psychiatric/Behavioral: Negative.     Past Medical History:  Diagnosis Date  . Breast cancer of lower-outer quadrant of left female breast ([place]) 01/03/2017  . Cancer ([place]) 11/22/2016   Breast   . Family history of breast cancer   . GERD (gastroesophageal reflux disease)   . History of radiation therapy 07/30/17- 09/10/17   Left Breast 50 gy in 25 fractions, Left Breast boost 10 Gy in 5 fractions.   . Personal history of chemotherapy    from 3/18 till 7/18    Past Surgical History:  Procedure Laterality Date  . BREAST LUMPECTOMY Left 01/22/17 and 01/30/17  . BREAST LUMPECTOMY WITH AXILLARY LYMPH NODE DISSECTION Left 01/30/2017   Procedure: RE-EXCISION OF LEFT BREAST LUMPECTOMY AND LEFT AXILLARY LYMPH NODE DISSECTION;  Surgeon: Coralie Keens, MD;  Location: [facility];  Service: General;  Laterality: Left;  . BREAST LUMPECTOMY WITH RADIOACTIVE SEED AND SENTINEL LYMPH NODE BIOPSY Left 01/22/2017   Procedure: BREAST LUMPECTOMY WITH RADIOACTIVE SEED AND SENTINEL LYMPH NODE BIOPSY;  Surgeon: Coralie Keens, MD;  Location: [facility];  Service: General;  Laterality: Left;  . COLONOSCOPY N/A 10/26/2015   Procedure: COLONOSCOPY;  Surgeon: Rogene Houston, MD;  [facility];  Service: Endoscopy;  Laterality: N/A;  . DILATION AND CURETTAGE OF UTERUS    . ESOPHAGOGASTRODUODENOSCOPY N/A 10/26/2015   Procedure: ESOPHAGOGASTRODUODENOSCOPY (EGD);  Surgeon: Rogene Houston, MD;  [facility];  Service: Endoscopy;  Laterality: N/A;  1:00  . PORTACATH PLACEMENT Right 01/22/2017  Procedure: INSERTION PORT-A-CATH;  Surgeon: Coralie Keens, MD;  Location: [facility];  Service: General;  Laterality: Right;  . URETHRAL DILATION      Family History  Problem Relation Age of Onset  . Hypertension Mother   . Hyperlipidemia Mother   . Heart disease Father   . Hypertension Father   . Diabetes Father     . Aneurysm Maternal Grandmother   . Stroke Maternal Grandfather   . Heart disease Paternal Grandfather   . Breast cancer Other        PGF's sister  . Breast cancer Other        PGF's sister  . Breast cancer Other        PGF's aunt (great, great aunt)  . Breast cancer Other        PGM's mother    Social History   Socioeconomic History  . Marital status: Married    Spouse name: [name]  . Number of children: 3  . Years of education: None  . Highest education level: None  Social Needs  . Financial resource strain: Not very hard  . Food insecurity - worry: Never true  . Food insecurity - inability: Never true  . Transportation needs - medical: No  . Transportation needs - non-medical: No  Occupational History  . None  Tobacco Use  . Smoking status: Former Smoker    Last attempt to quit: 07/23/2008    Years since quitting: 9.3  . Smokeless tobacco: Never Used  . Tobacco comment: quit smoking 07/23/2008 after smoking 30 yrs. (1/2 pack a day)  Substance and Sexual Activity  . Alcohol use: Yes    Alcohol/week: 0.0 oz    Comment: rare  . Drug use: No  . Sexual activity: Yes  Other Topics Concern  . None  Social History Narrative  . None     PHYSICAL EXAMINATION  ECOG PERFORMANCE STATUS: 1 - Symptomatic but completely ambulatory  Vitals:   11/14/17 0942  BP: 127/70  Pulse: 77  Resp: 18  Temp: 98.2 F (36.8 C)  SpO2: 98%    Physical Exam  Constitutional: She is oriented to person, place, and time. She appears well-developed and well-nourished. No distress.  HENT:  Head: Normocephalic and atraumatic.  Mouth/Throat: No oropharyngeal exudate.  Eyes: EOM are normal. Pupils are equal, round, and reactive to light. No scleral icterus.  Neck: Normal range of motion. Neck supple.  Cardiovascular: Normal rate and regular rhythm. Exam reveals no gallop and no friction rub.  No murmur heard. Pulmonary/Chest: Effort normal and breath sounds normal. No respiratory distress.   Abdominal: Soft. Bowel sounds are normal. She exhibits no distension.  Musculoskeletal: Normal range of motion. She exhibits no edema.  Neurological: She is alert and oriented to person, place, and time. No cranial nerve deficit.  Skin: Skin is warm and dry. No erythema.  Psychiatric: She has a normal mood and affect. Her behavior is normal. Judgment and thought content normal.     LABORATORY DATA: CBC    Component Value Date/Time   WBC 6.8 10/28/2017 1321   RBC 4.36 10/28/2017 1321   HGB 12.9 10/28/2017 1321   HCT 39.4 10/28/2017 1321   PLT 229 10/28/2017 1321   MCV 90.4 10/28/2017 1321   MCV 90.2 07/02/2013 1523   MCH 29.6 10/28/2017 1321   MCHC 32.7 10/28/2017 1321   RDW 14.8 10/28/2017 1321   LYMPHSABS 1.7 10/28/2017 1321   MONOABS 0.4 10/28/2017 1321   EOSABS 0.1 10/28/2017  1321   BASOSABS 0.0 10/28/2017 1321      Chemistry      Component Value Date/Time   NA 136 10/28/2017 1321   NA 144 10/25/2016 0910   K 3.7 10/28/2017 1321   CL 103 10/28/2017 1321   CO2 26 10/28/2017 1321   BUN 12 10/28/2017 1321   BUN 13 10/25/2016 0910   CREATININE 0.76 10/28/2017 1321   CREATININE 0.78 07/02/2013 1448      Component Value Date/Time   CALCIUM 9.0 10/28/2017 1321   ALKPHOS 102 10/28/2017 1321   AST 27 10/28/2017 1321   ALT 25 10/28/2017 1321   BILITOT 0.8 10/28/2017 1321   BILITOT 0.9 10/25/2016 0910        ASSESSMENT AND PLAN:  1. Stage IIIA (pT2pN1AM0) left breast cancer in the lower-outer quadrant, ER/PR/HER2 NEGATIVE, S/P left lumpectomy with positive margins by Dr. Ninfa Linden on 01/22/2017 with one positive left axillary node with positive for metastatic disease resulting in a re-excision on 01/30/2017 and left axillary lymph node dissection with invasive component focally 0.1-0.2 cm away from anterior margin but inked margin surface is negative and 0/6 benign lymph nodes. Adjuvant dose dense AC q2weeks x4 cycles followed by weekly taxol x 12 cycles from  02/13/17-06/30/17. Adjuvant RT completed on 09/10/17.   -She has a bilateral diagnostic mammograms for November 21, 2017. -Port flush today and q8 weeks. I have told patient that we will keep the port in for at least 1 year in case she has a recurrence. -RTC in 3 months for follow up with breast exam on next visit.   2. Facial numbness -I have told her that her facial numbness is not related to her breast cancer or subsequent chemo. She had the facial numbness even prior to her diagnosis of breast cancer. -Patient stated that Dr. Benjamine Mola was waiting to here from [place] stating that her facial numbness is not related to her breast cancer prior to ordering MRI. He can proceed with MRI. -I have made a stat referral to neurology for evaluation. She facial numbness may be related to a problem with her facial nerves.   RTC in 3 months for follow up.   All questions were answered. The patient knows to call the clinic with any problems, questions or concerns. We can certainly see the patient much sooner if necessary.    This note is electronically signed by: Twana First, MD 11/14/2017 9:51 AM

## 2017-11-14 NOTE — Patient Instructions (Signed)
[facility] at [facility] Discharge Instructions  RECOMMENDATIONS MADE BY THE CONSULTANT AND ANY TEST RESULTS WILL BE SENT TO YOUR REFERRING PHYSICIAN.  You saw Dr. Zhou today.  Thank you for choosing [facility] at [facility] to provide your oncology and hematology care.  To afford each patient quality time with our provider, please arrive at least 15 minutes before your scheduled appointment time.    If you have a lab appointment with the Cancer Center please come in thru the  Main Entrance and check in at the main information desk  You need to re-schedule your appointment should you arrive 10 or more minutes late.  We strive to give you quality time with our providers, and arriving late affects you and other patients whose appointments are after yours.  Also, if you no show three or more times for appointments you may be dismissed from the clinic at the providers discretion.     Again, thank you for choosing [facility].  Our hope is that these requests will decrease the amount of time that you wait before being seen by our physicians.       _____________________________________________________________  Should you have questions after your visit to [facility], please contact our office at [phone] between the hours of 8:30 a.m. and 4:30 p.m.  Voicemails left after 4:30 p.m. will not be returned until the following business day.  For prescription refill requests, have your pharmacy contact our office.       Resources For Cancer Patients and their Caregivers ? American Cancer Society: Can assist with transportation, wigs, general needs, runs Look Good Feel Better.        1-888-227-6333 ? Cancer Care: Provides financial assistance, online support groups, medication/co-pay assistance.  1-800-813-HOPE (4673) ? [facility] Assists [place] cancer patients and their families through  emotional , educational and financial support.  336-427-4357 ? [place] DSS Where to apply for food stamps, Medicaid and utility assistance. 336-342-1394 ? RCATS: Transportation to medical appointments. 336-347-2287 ? Social Security Administration: May apply for disability if have a Stage IV cancer. 336-342-7796 1-800-772-1213 ? [place] Aging, Disability and Transit Services: Assists with nutrition, care and transit needs. 336-349-2343  Cancer Center Support Programs: @[email or web address]@ > Cancer Support Group  2nd Tuesday of the month 1pm-2pm, Journey Room  > Creative Journey  3rd Tuesday of the month 1130am-1pm, Journey Room  > Look Good Feel Better  1st Wednesday of the month 10am-12 noon, Journey Room (Call American Cancer Society to register 1-800-395-5775)    

## 2017-11-21 ENCOUNTER — Ambulatory Visit
Admission: RE | Admit: 2017-11-21 | Discharge: 2017-11-21 | Disposition: A | Discharge status: Home / Self Care | Payer: BLUE CROSS/BLUE SHIELD | Source: Ambulatory Visit | Attending: Oncology | Admitting: Oncology

## 2017-11-21 ENCOUNTER — Encounter: Payer: Self-pay | Admitting: *Deleted

## 2017-11-21 DIAGNOSIS — C50512 Malignant neoplasm of lower-outer quadrant of left female breast: Secondary | ICD-10-CM

## 2017-11-21 HISTORY — DX: Personal history of irradiation: Z92.3

## 2017-11-25 ENCOUNTER — Encounter (HOSPITAL_COMMUNITY): Payer: BLUE CROSS/BLUE SHIELD

## 2017-12-02 ENCOUNTER — Ambulatory Visit (HOSPITAL_COMMUNITY): Payer: BLUE CROSS/BLUE SHIELD

## 2017-12-02 ENCOUNTER — Other Ambulatory Visit (HOSPITAL_COMMUNITY): Payer: BLUE CROSS/BLUE SHIELD

## 2017-12-23 ENCOUNTER — Telehealth: Payer: Self-pay | Admitting: Pediatrics

## 2017-12-23 NOTE — Telephone Encounter (Signed)
Pt CBJS Loratidine previously by another Dr Please advise

## 2017-12-25 MED ORDER — LORATADINE 10 MG PO TABS: 10.0000 mg | ORAL_TABLET | Freq: Every day | ORAL | 1 refills | Status: DC

## 2017-12-25 NOTE — Telephone Encounter (Signed)
That's fine

## 2017-12-25 NOTE — Telephone Encounter (Signed)
Medication sent to pharmacy  

## 2017-12-26 ENCOUNTER — Encounter (HOSPITAL_COMMUNITY): Payer: BLUE CROSS/BLUE SHIELD

## 2018-01-02 ENCOUNTER — Inpatient Hospital Stay (HOSPITAL_COMMUNITY): Payer: BLUE CROSS/BLUE SHIELD | Attending: Internal Medicine

## 2018-01-02 ENCOUNTER — Encounter (HOSPITAL_COMMUNITY): Payer: Self-pay

## 2018-01-02 VITALS — BP 133/71 | HR 70 | Temp 97.6°F | Resp 18

## 2018-01-02 DIAGNOSIS — Z452 Encounter for adjustment and management of vascular access device: Secondary | ICD-10-CM | POA: Diagnosis not present

## 2018-01-02 DIAGNOSIS — C50512 Malignant neoplasm of lower-outer quadrant of left female breast: Secondary | ICD-10-CM | POA: Insufficient documentation

## 2018-01-02 DIAGNOSIS — Z171 Estrogen receptor negative status [ER-]: Secondary | ICD-10-CM

## 2018-01-02 MED ORDER — HEPARIN SOD (PORK) LOCK FLUSH 100 UNIT/ML IV SOLN
INTRAVENOUS | Status: AC
  Filled 2018-01-02: qty 5

## 2018-01-02 MED ORDER — LIDOCAINE-PRILOCAINE 2.5-2.5 % EX CREA: 1.0000 "application " | TOPICAL_CREAM | CUTANEOUS | 0 refills | Status: DC | PRN

## 2018-01-02 MED ORDER — SODIUM CHLORIDE 0.9% FLUSH
10.0000 mL | Freq: Once | INTRAVENOUS | Status: AC
  Administered 2018-01-02: 10 mL via INTRAVENOUS

## 2018-01-02 MED ORDER — HEPARIN SOD (PORK) LOCK FLUSH 100 UNIT/ML IV SOLN
500.0000 [IU] | Freq: Once | INTRAVENOUS | Status: AC
  Administered 2018-01-02: 500 [IU] via INTRAVENOUS

## 2018-01-02 NOTE — Progress Notes (Signed)
Patients port flushed per protocol.  Port site clean and dry with no bruising or swelling noted at site.  Band aid applied.  VSS with discharge and left ambulatory with family.  No s/s of distress noted.

## 2018-01-02 NOTE — Patient Instructions (Signed)
[facility]  Discharge Instructions:  Your port was flushed today. _______________________________________________________________  Thank you for choosing [facility] to provide your oncology and hematology care.  To afford each patient quality time with our providers, please arrive at least 15 minutes before your scheduled appointment.  You need to re-schedule your appointment if you arrive 10 or more minutes late.  We strive to give you quality time with our providers, and arriving late affects you and other patients whose appointments are after yours.  Also, if you no show three or more times for appointments you may be dismissed from the clinic.  Again, thank you for choosing [facility]. Our hope is that these requests will allow you access to exceptional care and in a timely manner. _______________________________________________________________  If you have questions after your visit, please contact our office at [phone] between the hours of 8:30 a.m. and 5:00 p.m. Voicemails left after 4:30 p.m. will not be returned until the following business day. _______________________________________________________________  For prescription refill requests, have your pharmacy contact our office. _______________________________________________________________  Recommendations made by the consultant and any test results will be sent to your referring physician. _______________________________________________________________ 

## 2018-02-05 ENCOUNTER — Ambulatory Visit: Payer: BLUE CROSS/BLUE SHIELD | Admitting: Pediatrics

## 2018-02-06 ENCOUNTER — Ambulatory Visit: Payer: BLUE CROSS/BLUE SHIELD | Admitting: Pediatrics

## 2018-02-13 ENCOUNTER — Encounter: Payer: Self-pay | Admitting: Pediatrics

## 2018-02-13 ENCOUNTER — Ambulatory Visit: Payer: BLUE CROSS/BLUE SHIELD | Admitting: Pediatrics

## 2018-02-13 VITALS — BP 119/83 | HR 71 | Temp 97.5°F | Ht 65.0 in | Wt 218.2 lb

## 2018-02-13 DIAGNOSIS — F458 Other somatoform disorders: Secondary | ICD-10-CM

## 2018-02-13 DIAGNOSIS — R200 Anesthesia of skin: Secondary | ICD-10-CM | POA: Diagnosis not present

## 2018-02-13 DIAGNOSIS — R0989 Other specified symptoms and signs involving the circulatory and respiratory systems: Secondary | ICD-10-CM

## 2018-02-13 DIAGNOSIS — R198 Other specified symptoms and signs involving the digestive system and abdomen: Secondary | ICD-10-CM

## 2018-02-13 NOTE — Patient Instructions (Addendum)
[facility] Phone: [phone]

## 2018-02-13 NOTE — Progress Notes (Signed)
  Subjective:   Patient ID: Shannon Logan, female    DOB: Dec 02, 1966, 52 y.o.   MRN: [id number] CC: Follow-up (3 month) face numbness and globus sensation HPI: Shannon Logan is a 52 y.o. female presenting for Follow-up (3 month)  Has symmetric numbness/dulled sensation, feels  Getting in a hurry, such as getting behind at work tend to make symptoms intensify. 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 Saw ENT. 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 Taking pantoprazole for reflux and gastritis since EGD in 09/2015. Has had globus sensation since reaction to one of her chemo medications when first starting. Lasted for about nine days. Happened next round of chemo when steroid dose was halved. After steroids stopped didn't get feeling anymore. Pt not sure if has been there since then or if it got better then came back. Did have radiation therapy with a radiation burn over L side of lower neck  No reflux symptoms as long as she takes PPI. If she misses two days had return of burning.   Relevant past medical, surgical, family and social history reviewed. Allergies and medications reviewed and updated. Social History   Tobacco Use  Smoking Status Former Smoker  . Last attempt to quit: 07/23/2008  . Years since quitting: 9.5  Smokeless Tobacco Never Used  Tobacco Comment   quit smoking 07/23/2008 after smoking 30 yrs. (1/2 pack a day)   ROS: Per HPI   Objective:    BP 119/83   Pulse 71   Temp (!) 97.5 F (36.4 C) (Oral)   Ht 5\' 5"  (1.651 m)   Wt 218 lb 3.2 oz (99 kg)   BMI 36.31 kg/m   Wt Readings from Last 3 Encounters:  02/13/18 218 lb 3.2 oz (99 kg)  11/14/17 217 lb 6.4 oz (98.6 kg)  10/31/17 215 lb 6.4 oz (97.7 kg)    Gen: NAD, alert, cooperative with exam, NCAT EYES: EOMI, no conjunctival injection, or no icterus ENT:  TMs pearly gray b/l,  OP without erythema LYMPH: no cervical LAD CV: NRRR, normal S1/S2, no murmur, distal pulses 2+ b/l Resp: CTABL, no wheezes, normal WOB Abd: +BS, soft, NTND. no guarding or organomegaly Ext: No edema, warm Neuro: Alert and oriented, strength equal b/l UE, hand grip. Altered sensation across eye brows, cheeks, around mouth. and LE, coordination grossly normal  Assessment & Plan:  Shannon Logan was seen today for follow-up med problems.  Diagnoses and all orders for this visit:  Globus sensation Cont PPI. Reasonable to follow up with GI if not improving. Possible related to radiation?  Facial numbness Symmetric. Has appt next week with neurology  Follow up plan: Return in about 1 year (around 02/14/2019). Assunta Found, MD [place]

## 2018-02-20 ENCOUNTER — Inpatient Hospital Stay (HOSPITAL_COMMUNITY): Payer: BLUE CROSS/BLUE SHIELD | Attending: Internal Medicine | Admitting: Internal Medicine

## 2018-02-20 ENCOUNTER — Encounter: Payer: Self-pay | Admitting: Neurology

## 2018-02-20 ENCOUNTER — Inpatient Hospital Stay (HOSPITAL_COMMUNITY): Payer: BLUE CROSS/BLUE SHIELD

## 2018-02-20 ENCOUNTER — Encounter (HOSPITAL_COMMUNITY): Payer: Self-pay | Admitting: Internal Medicine

## 2018-02-20 ENCOUNTER — Ambulatory Visit: Payer: BLUE CROSS/BLUE SHIELD | Admitting: Neurology

## 2018-02-20 VITALS — BP 138/82 | HR 85 | Ht 64.0 in | Wt 213.0 lb

## 2018-02-20 VITALS — BP 119/71 | HR 82 | Temp 97.9°F | Resp 14 | Wt 215.7 lb

## 2018-02-20 DIAGNOSIS — Z803 Family history of malignant neoplasm of breast: Secondary | ICD-10-CM | POA: Diagnosis not present

## 2018-02-20 DIAGNOSIS — Z87891 Personal history of nicotine dependence: Secondary | ICD-10-CM | POA: Diagnosis not present

## 2018-02-20 DIAGNOSIS — Z171 Estrogen receptor negative status [ER-]: Secondary | ICD-10-CM | POA: Diagnosis not present

## 2018-02-20 DIAGNOSIS — C50512 Malignant neoplasm of lower-outer quadrant of left female breast: Secondary | ICD-10-CM | POA: Diagnosis not present

## 2018-02-20 DIAGNOSIS — R202 Paresthesia of skin: Secondary | ICD-10-CM

## 2018-02-20 DIAGNOSIS — Z9221 Personal history of antineoplastic chemotherapy: Secondary | ICD-10-CM | POA: Diagnosis not present

## 2018-02-20 DIAGNOSIS — R200 Anesthesia of skin: Secondary | ICD-10-CM | POA: Diagnosis not present

## 2018-02-20 DIAGNOSIS — C773 Secondary and unspecified malignant neoplasm of axilla and upper limb lymph nodes: Secondary | ICD-10-CM | POA: Diagnosis not present

## 2018-02-20 DIAGNOSIS — Z923 Personal history of irradiation: Secondary | ICD-10-CM | POA: Diagnosis not present

## 2018-02-20 MED ORDER — HEPARIN SOD (PORK) LOCK FLUSH 100 UNIT/ML IV SOLN
INTRAVENOUS | Status: AC
  Filled 2018-02-20: qty 5

## 2018-02-20 MED ORDER — HEPARIN SOD (PORK) LOCK FLUSH 100 UNIT/ML IV SOLN
500.0000 [IU] | Freq: Once | INTRAVENOUS | Status: AC
  Administered 2018-02-20: 500 [IU] via INTRAVENOUS

## 2018-02-20 MED ORDER — SODIUM CHLORIDE 0.9% FLUSH
10.0000 mL | INTRAVENOUS | Status: DC | PRN
  Administered 2018-02-20: 10 mL via INTRAVENOUS
  Filled 2018-02-20: qty 10

## 2018-02-20 NOTE — Progress Notes (Signed)
Shannon Logan tolerated portacath flush well without complaints or incident. Port accessed with 20 gauge needle with blood return noted then flushed with 10 ml NS and 5 ml Heparin easily per protocol then de-accessed. Pt discharged self ambulatory in satisfactory condition accompanied by family member

## 2018-02-20 NOTE — Patient Instructions (Signed)
[facility] at [facility] Discharge Instructions  You saw Dr. Higgs today.   Thank you for choosing  [facility] at [facility] to provide your oncology and hematology care.  To afford each patient quality time with our provider, please arrive at least 15 minutes before your scheduled appointment time.   If you have a lab appointment with the Cancer Center please come in thru the  Main Entrance and check in at the main information desk  You need to re-schedule your appointment should you arrive 10 or more minutes late.  We strive to give you quality time with our providers, and arriving late affects you and other patients whose appointments are after yours.  Also, if you no show three or more times for appointments you may be dismissed from the clinic at the providers discretion.     Again, thank you for choosing [facility].  Our hope is that these requests will decrease the amount of time that you wait before being seen by our physicians.       _____________________________________________________________  Should you have questions after your visit to [facility], please contact our office at [phone] between the hours of 8:30 a.m. and 4:30 p.m.  Voicemails left after 4:30 p.m. will not be returned until the following business day.  For prescription refill requests, have your pharmacy contact our office.       Resources For Cancer Patients and their Caregivers ? American Cancer Society: Can assist with transportation, wigs, general needs, runs Look Good Feel Better.        1-888-227-6333 ? Cancer Care: Provides financial assistance, online support groups, medication/co-pay assistance.  1-800-813-HOPE (4673) ? [facility] Assists [place] cancer patients and their families through emotional , educational and financial support.  336-427-4357 ? [place] DSS Where to apply for food  stamps, Medicaid and utility assistance. 336-342-1394 ? RCATS: Transportation to medical appointments. 336-347-2287 ? Social Security Administration: May apply for disability if have a Stage IV cancer. 336-342-7796 1-800-772-1213 ? [place] Aging, Disability and Transit Services: Assists with nutrition, care and transit needs. 336-349-2343  Cancer Center Support Programs:   > Cancer Support Group  2nd Tuesday of the month 1pm-2pm, Journey Room   > Creative Journey  3rd Tuesday of the month 1130am-1pm, Journey Room     

## 2018-02-20 NOTE — Progress Notes (Signed)
Reason for visit: Facial numbness  Referring physician: Dr. Charlyne Logan is a 52 y.o. female  History of present illness:  Shannon Logan is a 52 year old right-handed white female with a history of stage III breast cancer.  The patient noted onset of facial numbness well before the diagnosis of breast cancer.  In the summer 2017, the patient had an episode of dizziness, she underwent a CT scan of the head and was told that this was unremarkable at that point.  Beginning in October 2017 the patient began having numbness around the eyes bilaterally and around the mouth with some numbness affecting the tongue.  The patient would sometimes have a pressure sensation with the numbness and would have some blurring of vision.  She denied any problems with speech or swallowing.  She has not had any double vision or loss of vision.  She has not had overt headache issues.  The numbness will come and go, at times she feels normal, she oftentimes feels better in the morning, worse as the day goes on and worse with stress.  She feels better on the weekends when she is more relaxed.  The patient denies any weakness of the extremities, she has gotten some numbness in the hands and feet following Taxol therapy for her breast cancer.  Taxol therapy was finished in July 2018 and radiation therapy was finished in September 2018.  The patient denies any balance issues, she does have some urinary urgency and occasional incontinence.  She denies problems controlling the bowels.  She does not have significant neck pain or pain down the arms.  She denies troubles with her memory or with concentration.  She is sent to this office for an evaluation.  Past Medical History:  Diagnosis Date  . Breast cancer of lower-outer quadrant of left female breast ([place]) 01/03/2017  . Cancer ([place]) 11/22/2016   Breast   . Family history of breast cancer   . GERD (gastroesophageal reflux disease)   . History of radiation therapy  07/30/17- 09/10/17   Left Breast 50 gy in 25 fractions, Left Breast boost 10 Gy in 5 fractions.   . Personal history of chemotherapy    from 3/18 till 7/18  . Personal history of radiation therapy     Past Surgical History:  Procedure Laterality Date  . BREAST LUMPECTOMY Left 01/22/17 and 01/30/17  . BREAST LUMPECTOMY WITH AXILLARY LYMPH NODE DISSECTION Left 01/30/2017   Procedure: RE-EXCISION OF LEFT BREAST LUMPECTOMY AND LEFT AXILLARY LYMPH NODE DISSECTION;  Surgeon: Coralie Keens, MD;  Location: [facility];  Service: General;  Laterality: Left;  . BREAST LUMPECTOMY WITH RADIOACTIVE SEED AND SENTINEL LYMPH NODE BIOPSY Left 01/22/2017   Procedure: BREAST LUMPECTOMY WITH RADIOACTIVE SEED AND SENTINEL LYMPH NODE BIOPSY;  Surgeon: Coralie Keens, MD;  Location: [facility];  Service: General;  Laterality: Left;  . COLONOSCOPY N/A 10/26/2015   Procedure: COLONOSCOPY;  Surgeon: Rogene Houston, MD;  [facility];  Service: Endoscopy;  Laterality: N/A;  . DILATION AND CURETTAGE OF UTERUS    . ESOPHAGOGASTRODUODENOSCOPY N/A 10/26/2015   Procedure: ESOPHAGOGASTRODUODENOSCOPY (EGD);  Surgeon: Rogene Houston, MD;  [facility];  Service: Endoscopy;  Laterality: N/A;  1:00  . PORTACATH PLACEMENT Right 01/22/2017   Procedure: INSERTION PORT-A-CATH;  Surgeon: Coralie Keens, MD;  Location: [facility];  Service: General;  Laterality: Right;  . URETHRAL DILATION      Family History  Problem  Relation Age of Onset  . Hypertension Mother   . Hyperlipidemia Mother   . Heart disease Father   . Hypertension Father   . Diabetes Father   . Aneurysm Maternal Grandmother   . Stroke Maternal Grandfather   . Heart disease Paternal Grandfather   . Breast cancer Other        PGF's sister  . Breast cancer Other        PGF's sister  . Breast cancer Other        PGF's aunt (great, great aunt)  . Breast cancer Other        PGM's mother     Social history:  reports that she quit smoking about 9 years ago. she has never used smokeless tobacco. She reports that she drinks alcohol. She reports that she does not use drugs.  Medications:  Prior to Admission medications   Medication Sig Start Date End Date Taking? Authorizing Provider  Ibuprofen (MOTRIN PO) Take by mouth.   Yes [provider]  lidocaine-prilocaine (EMLA) cream Apply 1 application topically as needed. 01/02/18  Yes Holley Bouche, NP  loratadine (ALLERGY) 10 MG tablet Take 1 tablet (10 mg total) by mouth daily. 12/25/17  Yes [name], MD  pantoprazole (PROTONIX) 40 MG tablet Take 1 tablet (40 mg total) by mouth daily. 08/25/17  Yes Setzer, Rona Ravens, NP      Allergies  Allergen Reactions  . Asa [Aspirin]     Ate a lot of baby aspirin when she was small, mom was told she was allergic, not sure the reaction  . Decadron [Dexamethasone]     Throat swelling, difficulty breathing, numbness face, tongue, facial flushing  . Prednisone     Numbness in face and arms  . Sulfa Antibiotics     ROS:  Out of a complete 14 system review of symptoms, the patient complains only of the following symptoms, and all other reviewed systems are negative.  Dizziness, vertigo, difficulty swallowing Skin rash, birthmarks, moles Snoring Joint pain, aching muscles Allergies Numbness   Blood pressure 138/82, pulse 85, height 5\' 4"  (1.626 m), weight 213 lb (96.6 kg).  Physical Exam  General: The patient is alert and cooperative at the time of the examination.  The patient is moderately obese.  Eyes: Pupils are equal, round, and reactive to light. Discs are flat bilaterally.  Neck: The neck is supple, no carotid bruits are noted.  Respiratory: The respiratory examination is clear.  Cardiovascular: The cardiovascular examination reveals a regular rate and rhythm, no obvious murmurs or rubs are noted.  Skin: Extremities are without significant  edema.  Neurologic Exam  Mental status: The patient is alert and oriented x 3 at the time of the examination. The patient has apparent normal recent and remote memory, with an apparently normal attention span and concentration ability.  Cranial nerves: Facial symmetry is present. There is good sensation of the face to pinprick and soft touch bilaterally. The strength of the facial muscles and the muscles to head turning and shoulder shrug are normal bilaterally. Speech is well enunciated, no aphasia or dysarthria is noted. Extraocular movements are full. Visual fields are full. The tongue is midline, and the patient has symmetric elevation of the soft palate. No obvious hearing deficits are noted.  Motor: The motor testing reveals 5 over 5 strength of all 4 extremities. Good symmetric motor tone is noted throughout.  Sensory: Sensory testing is intact to pinprick, soft touch, vibration sensation, and position sense  on all 4 extremities. No evidence of extinction is noted.  Coordination: Cerebellar testing reveals good finger-nose-finger and heel-to-shin bilaterally.  Gait and station: Gait is normal. Tandem gait is normal. Romberg is negative. No drift is seen.  Reflexes: Deep tendon reflexes are symmetric and normal bilaterally. Toes are downgoing bilaterally.   Assessment/Plan:  1.  Intermittent facial numbness  2.  History of breast cancer  The patient has had some intermittent issues with facial numbness, slight pressure sensations and blurred vision that have been going on for about a year and a half.  The problems have not worsened much over time.  This likely represents a benign entity, possibly an unusual manifestation of a tension type headache.  The patient will be set up for MRI of the brain with and without gadolinium enhancement.  She will follow-up through this office if needed.  The sensations do not cause discomfort per se.  Jill Alexanders MD 02/20/2018 11:05 AM  [facility] [address] [place], [place] [address]  Phone [phone] Fax [phone]

## 2018-02-20 NOTE — Patient Instructions (Signed)
   We will get MRI of the brain.

## 2018-02-20 NOTE — Patient Instructions (Addendum)
[place] at [facility] Discharge Instructions  Portacath flushed per protocol today. Follow-up as scheduled. Call clinic for any questions or concerns.  Return in December after your mammogram. We will refer you to have your port removed before your next mammogram.      Thank you for choosing [place] at [facility] to provide your oncology and hematology care.  To afford each patient quality time with our provider, please arrive at least 15 minutes before your scheduled appointment time.   If you have a lab appointment with the [place] please come in thru the  Main Entrance and check in at the main information desk  You need to re-schedule your appointment should you arrive 10 or more minutes late.  We strive to give you quality time with our providers, and arriving late affects you and other patients whose appointments are after yours.  Also, if you no show three or more times for appointments you may be dismissed from the clinic at the providers discretion.     Again, thank you for choosing [facility].  Our hope is that these requests will decrease the amount of time that you wait before being seen by our physicians.       _____________________________________________________________  Should you have questions after your visit to [facility], please contact our office at [phone] between the hours of 8:30 a.m. and 4:30 p.m.  Voicemails left after 4:30 p.m. will not be returned until the following business day.  For prescription refill requests, have your pharmacy contact our office.       Resources For Cancer Patients and their Caregivers ? American Cancer Society: Can assist with transportation, wigs, general needs, runs Look Good Feel Better.        410-299-7895 ? Cancer Care: Provides financial assistance, online support groups, medication/co-pay assistance.  1-800-813-HOPE (787) 109-0836) ? [facility] Assists [place] cancer patients and their families through emotional , educational and financial support.  5043581170 ? [place] DSS Where to apply for food stamps, Medicaid and utility assistance. 256-702-4812 ? RCATS: Transportation to medical appointments. 7090582108 ? Social Security Administration: May apply for disability if have a Stage IV cancer. 334 545 0186 832-684-6052 ? [employer], Disability and Transit Services: Assists with nutrition, care and transit needs. [place] Support Programs:   > Cancer Support Group  2nd Tuesday of the month 1pm-2pm, Journey Room   > Creative Journey  3rd Tuesday of the month 1130am-1pm, Journey Room

## 2018-02-23 ENCOUNTER — Telehealth: Payer: Self-pay | Admitting: Neurology

## 2018-02-23 NOTE — Telephone Encounter (Signed)
Due to patients schedule she would like it at [place]. Order sent to [facility].

## 2018-02-23 NOTE — Telephone Encounter (Signed)
02/23/18 [employer] Auth: [id number] (exp. 02/23/18 to 03/24/18) EE 02/23/18 lvm for pt to call back about scheduling mri EE

## 2018-03-06 ENCOUNTER — Ambulatory Visit
Admission: RE | Admit: 2018-03-06 | Discharge: 2018-03-06 | Disposition: A | Discharge status: Home / Self Care | Payer: BLUE CROSS/BLUE SHIELD | Source: Ambulatory Visit | Attending: Neurology | Admitting: Neurology

## 2018-03-06 DIAGNOSIS — R202 Paresthesia of skin: Secondary | ICD-10-CM | POA: Diagnosis not present

## 2018-03-06 MED ORDER — GADOBENATE DIMEGLUMINE 529 MG/ML IV SOLN
20.0000 mL | Freq: Once | INTRAVENOUS | Status: AC | PRN
  Administered 2018-03-06: 20 mL via INTRAVENOUS

## 2018-03-08 ENCOUNTER — Telehealth: Payer: Self-pay | Admitting: Neurology

## 2018-03-08 NOTE — Telephone Encounter (Signed)
  I called the patient.  The MRI of the brain is relatively unremarkable, single deep white matter lesion is seen in the left parietal area that does not explain the patient's symptoms of numbness on the face bilaterally and numbness affecting the tongue.  The patient has daily episodic not persistent symptoms.  I have asked her to contact me if her symptoms change or become more persistent, we can recheck the MRI study at any time in the future.  MRI brain 03/06/18:  IMPRESSION: This MRI of the brain with and without contrast shows the following: 1.    There is a single left parietal T2/FLAIR hyperintense periventricular white matter focus.  This is of unknown significance and could represent a focus of chronic microvascular ischemic change or demyelination. 2.    There is a normal enhancement pattern and there are no acute findings.

## 2018-03-19 NOTE — Progress Notes (Signed)
Diagnosis Malignant neoplasm of lower-outer quadrant of left breast of female, estrogen receptor negative (HCC) - Plan: MM Digital Diagnostic Bilat, CBC with Differential/Platelet, Comprehensive metabolic panel, Lactate dehydrogenase  Staging Cancer Staging Breast cancer of lower-outer quadrant of left female breast (HCC) Staging form: Breast, AJCC 8th Edition - Pathologic stage from 02/05/2017: Stage IIIA (pT2, pN1a, cM0, G3, ER: Negative, PR: Negative, HER2: Negative) - Signed by Kefalas, Thomas S, PA-C on 02/05/2017   Assessment and Plan:  1. Stage IIIA (pT2pN1AM0) left breast cancer in the lower-outer quadrant, ER/PR/HER2 NEGATIVE, S/P left lumpectomy with positive margins by Dr. Blackman on 01/22/2017 with one positive left axillary node with positive for metastatic disease resulting in a re-excision on 01/30/2017 and left axillary lymph node dissection with invasive component focally 0.1-0.2 cm away from anterior margin but inked margin surface is negative and 0/6 benign lymph nodes. Adjuvant dose dense AC q2weeks x4 cycles followed by weekly taxol x 12 cycles from 02/13/17-06/30/17. Adjuvant RT completed on 09/10/17. Pt was previously followed by Dr. Zhou.  She had bilateral diagnostic mammograms in November 21, 2017 that was negative.  She is set up for bilateral diagnostic mammogram in 11/2018 and will follow-up at that time to go over results.  She should notify the office if she has any problems prior to that visit.  2.  Facial numbness.  She reports this is intermittent.  She is undergoing evaluation with Dr. Willis of neurology.  Continue follow-up as directed.    3.  Family history of breast cancer.  Patient has undergone genetic testing.   Interval History:  51 y.o. female with Stage IIIA (pT2pN1AM0) invasive left breast cancer in the lower-outer quadrant, ER/PR/HER2 NEGATIVE, S/P left breast lumpectomy with positive margins by Dr. Blackman on 01/22/2017 with one positive left axillary lymph  node positive for metastatic disease resulting in a re-excision on 01/30/2017 and left axillary lymph node dissection with invasive component focally 0.1- 0.2 cm away from anterior margin, but inked margin surface is negative and 0/6 lymph nodes.  Patient was treated with RT with 50 Gy in 25 fractions, followed by 10 Gy boost in 5 fractions, completed on 09/10/2017 under the care of Dr. Squire.  Current Status:  Pt is seen today for follow-up.  She is being seen by neurology due to facial numbness.      Breast cancer of lower-outer quadrant of left female breast (HCC)   11/19/2016 Mammogram    In the lateral aspect of the left breast, posterior depth, there is an irregular spiculated mass measuring approximately 1.8 cm. There are a few pleomorphic calcifications in a linear distribution within the mass and extending slightly anterior to the mass. All together the mass and calcifications span approximately 2.5 cm.      11/21/2016 Procedure    Breast, left, needle core biopsy, 3:30 o'clock, 8 cm fn      11/22/2016 Pathology Results    Breast, left, needle core biopsy, 3:30 o'clock, 8 cm fn - INVASIVE DUCTAL CARCINOMA, GRADE 2.      01/03/2017 Initial Diagnosis    Breast cancer of lower-outer quadrant of left female breast (HCC)      01/22/2017 Procedure    Left breast lumpectomy and left axillary sentinel node biopsy by Dr. Blackman      01/22/2017 Procedure    Port placed by Dr. Blackman      01/23/2017 Pathology Results    1. Breast, lumpectomy, Left - INVASIVE GRADE 3 DUCTAL CARCINOMA, SPANNING 2.2 CM IN GREATEST   DIMENSION. 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 - LYMPH/VASCULAR INVASION IS PRESENT. - ANTERIOR / MEDIAL MARGIN IS FOCALLY POSITIVE FOR INVASIVE DUCTAL CARCINOMA AND ANTERIOR MARGIN IS BROADLY POSITIVE FOR INVASIVE DUCTAL CARCINOMA. - DUCTAL CARCINOMA IN SITU IS FOCALLY LESS THAN 0.1 CM TO POSTERIOR MARGIN. - OTHER MARGINS ARE NEGATIVE. -  SEE ONCOLOGY TEMPLATE. 2. Lymph node, sentinel, biopsy, Left axillary - ONE BENIGN LYMPH NODE WITH NO TUMOR SEEN (0/1). 3. Lymph node, sentinel, biopsy, Left axillary - ONE LYMPH NODE POSITIVE FOR METASTATIC DUCTAL CARCINOMA (1/1).      01/30/2017 Procedure    Breast, excision, Left, new anterior and medial margins and Lymph nodes, regional resection, Left axillary contents      01/31/2017 Pathology Results    Diagnosis 1. Breast, excision, Left, new anterior and medial margins - RESIDUAL INVASIVE DUCTAL CARCINOMA AND EXTENSIVE RESIDUAL DUCTAL CARCINOMA IN SITU. - INVASIVE DUCTAL CARCINOMA IS FOCALLY 0.1 TO 0.2 CM AWAY FROM ANTERIOR MARGIN BUT INKED MARGINAL SURFACE IS NEGATIVE. - DUCTAL CARCINOMA IN SITU IS FOCALLY LESS THAN 0.1 CM AWAY FROM ANTERIOR MARGIN AND FOCALLY LESS THAN 0.1 CM AWAY FROM MEDIAL ASPECT OF ANTERIOR MARGIN. - SEE COMMENT. 2. Lymph nodes, regional resection, Left axillary contents - SIX BENIGN LYMPH NODES WITH NO TUMOR SEEN (0/6).      02/10/2017 Imaging    MUGA- LEFT ventricular ejection fraction of 54%.  No focal wall motion abnormalities.       02/12/2017 Imaging    CT CAP- 1. Postoperative changes of recent left lumpectomy and left axillary lymph node dissection, without evidence of metastatic disease. 2. Aortic atherosclerosis (ICD10-170.0). Coronary artery calcification. 3. Liver appears fatty.      02/13/2017 -  Chemotherapy    The patient had DOXOrubicin (ADRIAMYCIN) chemo injection 126 mg, 60 mg/m2 = 126 mg, Intravenous,  Once, 1 of 4 cycles  palonosetron (ALOXI) injection 0.25 mg, 0.25 mg, Intravenous,  Once, 1 of 4 cycles  pegfilgrastim (NEULASTA ONPRO KIT) injection 6 mg, 6 mg, Subcutaneous, Once, 1 of 5 cycles  cyclophosphamide (CYTOXAN) 1,260 mg in sodium chloride 0.9 % 250 mL chemo infusion, 600 mg/m2 = 1,260 mg, Intravenous,  Once, 1 of 4 cycles  PACLitaxel (TAXOL) 168 mg in dextrose 5 % 250 mL chemo infusion ( fosaprepitant (EMEND) 150  mg, dexamethasone (DECADRON) 12 mg in sodium chloride 0.9 % 145 mL IVPB, , Intravenous,  Once, 1 of 4 cycles  for chemotherapy treatment.          Problem List Patient Active Problem List   Diagnosis Date Noted  . Epistaxis, recurrent [R04.0] 08/22/2017  . Gastroesophageal reflux disease [K21.9] 08/22/2017  . Pharyngeal dysphagia [R13.13] 08/22/2017  . Genetic testing [Z13.79] 01/20/2017  . Breast cancer of lower-outer quadrant of left female breast ([place]) [C50.512] 01/03/2017  . Family history of breast cancer [Z80.3]   . Prediabetes [R73.03] 10/10/2016  . Hypertriglyceridemia [E78.1] 04/05/2016  . BMI 32.0-32.9,adult [Z68.32] 09/22/2015  . Abdominal pain, right upper quadrant [R10.11] 07/11/2015    Past Medical History Past Medical History:  Diagnosis Date  . Breast cancer of lower-outer quadrant of left female breast ([place]) 01/03/2017  . Cancer ([place]) 11/22/2016   Breast   . Family history of breast cancer   . GERD (gastroesophageal reflux disease)   . History of radiation therapy 07/30/17- 09/10/17   Left Breast 50 gy in 25 fractions, Left Breast boost 10 Gy in 5 fractions.   . Personal history of chemotherapy    from  3/18 till 7/18  . Personal history of radiation therapy     Past Surgical History Past Surgical History:  Procedure Laterality Date  . BREAST LUMPECTOMY Left 01/22/17 and 01/30/17  . BREAST LUMPECTOMY WITH AXILLARY LYMPH NODE DISSECTION Left 01/30/2017   Procedure: RE-EXCISION OF LEFT BREAST LUMPECTOMY AND LEFT AXILLARY LYMPH NODE DISSECTION;  Surgeon: Coralie Keens, MD;  Location: [facility];  Service: General;  Laterality: Left;  . BREAST LUMPECTOMY WITH RADIOACTIVE SEED AND SENTINEL LYMPH NODE BIOPSY Left 01/22/2017   Procedure: BREAST LUMPECTOMY WITH RADIOACTIVE SEED AND SENTINEL LYMPH NODE BIOPSY;  Surgeon: Coralie Keens, MD;  Location: [facility];  Service: General;  Laterality: Left;  . COLONOSCOPY N/A 10/26/2015    Procedure: COLONOSCOPY;  Surgeon: Rogene Houston, MD;  [facility];  Service: Endoscopy;  Laterality: N/A;  . DILATION AND CURETTAGE OF UTERUS    . ESOPHAGOGASTRODUODENOSCOPY N/A 10/26/2015   Procedure: ESOPHAGOGASTRODUODENOSCOPY (EGD);  Surgeon: Rogene Houston, MD;  [facility];  Service: Endoscopy;  Laterality: N/A;  1:00  . PORTACATH PLACEMENT Right 01/22/2017   Procedure: INSERTION PORT-A-CATH;  Surgeon: Coralie Keens, MD;  Location: [facility];  Service: General;  Laterality: Right;  . URETHRAL DILATION      Family History Family History  Problem Relation Age of Onset  . Hypertension Mother   . Hyperlipidemia Mother   . Heart disease Father   . Hypertension Father   . Diabetes Father   . Aneurysm Maternal Grandmother   . Stroke Maternal Grandfather   . Heart disease Paternal Grandfather   . Breast cancer Other        PGF's sister  . Breast cancer Other        PGF's sister  . Breast cancer Other        PGF's aunt (great, great aunt)  . Breast cancer Other        PGM's mother     Social History  reports that she quit smoking about 9 years ago. She has never used smokeless tobacco. She reports that she drinks alcohol. She reports that she does not use drugs.  Medications  Current Outpatient Medications:  .  Ibuprofen (MOTRIN PO), Take by mouth., Disp: , Rfl:  .  lidocaine-prilocaine (EMLA) cream, Apply 1 application topically as needed., Disp: 30 g, Rfl: 0 .  loratadine (ALLERGY) 10 MG tablet, Take 1 tablet (10 mg total) by mouth daily., Disp: 90 tablet, Rfl: 1 .  pantoprazole (PROTONIX) 40 MG tablet, Take 1 tablet (40 mg total) by mouth daily., Disp: 90 tablet, Rfl: 3  Allergies Asa [aspirin]; Decadron [dexamethasone]; Prednisone; and Sulfa antibiotics  Review of Systems Review of Systems - Oncology ROS as per HPI otherwise 12 point ROS is negative.   Physical Exam  Vitals Wt Readings from Last 3 Encounters:  02/20/18  215 lb 11.2 oz (97.8 kg)  02/20/18 213 lb (96.6 kg)  02/13/18 218 lb 3.2 oz (99 kg)   Temp Readings from Last 3 Encounters:  02/20/18 97.9 F (36.6 C) (Oral)  02/13/18 (!) 97.5 F (36.4 C) (Oral)  01/02/18 97.6 F (36.4 C) (Oral)   BP Readings from Last 3 Encounters:  02/20/18 119/71  02/20/18 138/82  02/13/18 119/83   Pulse Readings from Last 3 Encounters:  02/20/18 82  02/20/18 85  02/13/18 71   Constitutional: Well-developed, well-nourished, and in no distress.   HENT: Head: Normocephalic and atraumatic.  Mouth/Throat: No oropharyngeal exudate. Mucosa moist. Eyes: Pupils  are equal, round, and reactive to light. Conjunctivae are normal. No scleral icterus.  Neck: Normal range of motion. Neck supple. No JVD present.  Cardiovascular: Normal rate, regular rhythm and normal heart sounds.  Exam reveals no gallop and no friction rub.   No murmur heard. Pulmonary/Chest: Effort normal and breath sounds normal. No respiratory distress. No wheezes.No rales.  Abdominal: Soft. Bowel sounds are normal. No distension. There is no tenderness. There is no guarding.  Musculoskeletal: No edema or tenderness.  Lymphadenopathy: No cervical, axillary or supraclavicular adenopathy.  Neurological: Alert and oriented to person, place, and time. No cranial nerve deficit.  Skin: Skin is warm and dry. No rash noted. No erythema. No pallor.  Psychiatric: Affect and judgment normal.  Bilateral breast exam: Chaperone present.Left breast: inverted nipple.  No palpable  masses noted bilaterall  Labs No visits with results within 3 Day(s) from this visit.  Latest known visit with results is:  Office Visit on 10/31/2017  Component Date Value Ref Range Status  . Vitamin B-12 10/31/2017 357  232 - 1,245 pg/mL Final  . TSH 10/31/2017 3.860  0.450 - 4.500 uIU/mL Final  . Vit D, 25-Hydroxy 10/31/2017 28.5* 30.0 - 100.0 ng/mL Final   Comment: Vitamin D deficiency has been defined by the Institute  of Medicine and an Endocrine Society practice guideline as a level of serum 25-OH vitamin D less than 20 ng/mL (1,2). The Endocrine Society went on to further define vitamin D insufficiency as a level between 21 and 29 ng/mL (2). 1. IOM (Institute of Medicine). 2010. Dietary reference    intakes for calcium and D. [place]: The    National Academies Press. 2. Holick MF, Binkley [place], Bischoff-Ferrari HA, et al.    Evaluation, treatment, and prevention of vitamin D    deficiency: an Endocrine Society clinical practice    guideline. JCEM. 2011 Jul; 96(7):1911-30.      Pathology Orders Placed This Encounter  Procedures  . MM Digital Diagnostic Bilat    Standing Status:   Future    Standing Expiration Date:   02/20/2019    Order Specific Question:   Reason for Exam (SYMPTOM  OR DIAGNOSIS REQUIRED)    Answer:   left breast cancer    Order Specific Question:   Is the patient pregnant?    Answer:   No    Order Specific Question:   Preferred imaging location?    Answer:   [facility]  . CBC with Differential/Platelet    Standing Status:   Future    Standing Expiration Date:   02/21/2019  . Comprehensive metabolic panel    Standing Status:   Future    Standing Expiration Date:   02/21/2019  . Lactate dehydrogenase    Standing Status:   Future    Standing Expiration Date:   02/21/2019         MD 

## 2018-04-17 ENCOUNTER — Encounter: Payer: Self-pay | Admitting: Family

## 2018-04-17 ENCOUNTER — Ambulatory Visit: Payer: BLUE CROSS/BLUE SHIELD | Admitting: Family

## 2018-04-17 VITALS — BP 116/66 | HR 71 | Temp 98.1°F | Ht 64.0 in | Wt 204.6 lb

## 2018-04-17 DIAGNOSIS — N3001 Acute cystitis with hematuria: Secondary | ICD-10-CM

## 2018-04-17 DIAGNOSIS — R350 Frequency of micturition: Secondary | ICD-10-CM

## 2018-04-17 LAB — URINALYSIS, COMPLETE
Bilirubin, UA: NEGATIVE
KETONES UA: NEGATIVE
Nitrite, UA: POSITIVE — AB
SPEC GRAV UA: 1.02 (ref 1.005–1.030)
Urobilinogen, Ur: 1 mg/dL (ref 0.2–1.0)
pH, UA: 5 (ref 5.0–7.5)

## 2018-04-17 LAB — MICROSCOPIC EXAMINATION: RBC, UA: >30 /hpf — AB (ref 0–2)

## 2018-04-17 MED ORDER — CIPROFLOXACIN HCL 500 MG PO TABS: 500.0000 mg | ORAL_TABLET | Freq: Two times a day (BID) | ORAL | 0 refills | Status: DC

## 2018-04-17 NOTE — Patient Instructions (Signed)

## 2018-04-17 NOTE — Progress Notes (Signed)
   Subjective:    Patient ID: Shannon Logan, female    DOB: 17-Jul-1966, 52 y.o.   MRN: [id number]  No chief complaint on file.   Urinary Frequency   This is a new problem. The current episode started yesterday. The problem occurs every urination. The problem has been waxing and waning. The quality of the pain is described as burning. The pain is at a severity of 8/10. The pain is moderate. There has been no fever. Associated symptoms include frequency. Pertinent negatives include no discharge, flank pain, hematuria, hesitancy, nausea, urgency or vomiting. Treatments tried: AZO. The treatment provided mild relief.      Review of Systems  Gastrointestinal: Negative for nausea and vomiting.  Genitourinary: Positive for frequency. Negative for flank pain, hematuria, hesitancy and urgency.  All other systems reviewed and are negative.      Objective:   Physical Exam  Constitutional: She is oriented to person, place, and time. She appears well-developed and well-nourished. No distress.  HENT:  Head: Normocephalic and atraumatic.  Right Ear: External ear normal.  Mouth/Throat: Oropharynx is clear and moist.  Eyes: Pupils are equal, round, and reactive to light.  Neck: Normal range of motion. Neck supple. No thyromegaly present.  Cardiovascular: Normal rate, regular rhythm, normal heart sounds and intact distal pulses.  No murmur heard. Pulmonary/Chest: Effort normal and breath sounds normal. No respiratory distress. She has no wheezes.  Abdominal: Soft. Bowel sounds are normal. She exhibits no distension. There is no tenderness.  Musculoskeletal: Normal range of motion. She exhibits no edema or tenderness.  Neurological: She is alert and oriented to person, place, and time. She has normal reflexes. No cranial nerve deficit.  Skin: Skin is warm and dry.  Psychiatric: She has a normal mood and affect. Her behavior is normal. Judgment and thought content normal.  Vitals  reviewed.     BP 116/66   Pulse 71   Temp 98.1 F (36.7 C) (Oral)   Ht 5\' 4"  (1.626 m)   Wt 204 lb 9.6 oz (92.8 kg)   BMI 35.12 kg/m      Assessment & Plan:  1. Urine frequency - Urinalysis, Complete  2. Acute cystitis with hematuria Force fluids AZO over the counter X2 days RTO prn Culture pending - ciprofloxacin (CIPRO) 500 MG tablet; Take 1 tablet (500 mg total) by mouth 2 (two) times daily.  Dispense: 10 tablet; Refill: 0 - Urine Culture    Evelina Dun, FNP

## 2018-04-20 LAB — URINE CULTURE

## 2018-04-22 ENCOUNTER — Encounter (HOSPITAL_COMMUNITY): Payer: BLUE CROSS/BLUE SHIELD

## 2018-04-24 ENCOUNTER — Inpatient Hospital Stay (HOSPITAL_COMMUNITY): Payer: BLUE CROSS/BLUE SHIELD | Attending: Internal Medicine

## 2018-04-24 ENCOUNTER — Encounter (HOSPITAL_COMMUNITY): Payer: Self-pay

## 2018-04-24 VITALS — BP 125/70 | Temp 97.8°F | Resp 16

## 2018-04-24 DIAGNOSIS — C50512 Malignant neoplasm of lower-outer quadrant of left female breast: Secondary | ICD-10-CM | POA: Diagnosis present

## 2018-04-24 DIAGNOSIS — Z452 Encounter for adjustment and management of vascular access device: Secondary | ICD-10-CM | POA: Diagnosis not present

## 2018-04-24 DIAGNOSIS — Z171 Estrogen receptor negative status [ER-]: Secondary | ICD-10-CM

## 2018-04-24 MED ORDER — SODIUM CHLORIDE 0.9% FLUSH
10.0000 mL | Freq: Once | INTRAVENOUS | Status: AC
  Administered 2018-04-24: 10 mL via INTRAVENOUS

## 2018-04-24 MED ORDER — HEPARIN SOD (PORK) LOCK FLUSH 100 UNIT/ML IV SOLN
500.0000 [IU] | Freq: Once | INTRAVENOUS | Status: AC
  Administered 2018-04-24: 500 [IU] via INTRAVENOUS
  Filled 2018-04-24: qty 5

## 2018-04-24 NOTE — Patient Instructions (Signed)
[facility]  Discharge Instructions:  Your port was flushed today. _______________________________________________________________  Thank you for choosing [facility] to provide your oncology and hematology care.  To afford each patient quality time with our providers, please arrive at least 15 minutes before your scheduled appointment.  You need to re-schedule your appointment if you arrive 10 or more minutes late.  We strive to give you quality time with our providers, and arriving late affects you and other patients whose appointments are after yours.  Also, if you no show three or more times for appointments you may be dismissed from the clinic.  Again, thank you for choosing  [facility]. Our hope is that these requests will allow you access to exceptional care and in a timely manner. _______________________________________________________________  If you have questions after your visit, please contact our office at [phone] between the hours of 8:30 a.m. and 5:00 p.m. Voicemails left after 4:30 p.m. will not be returned until the following business day. _______________________________________________________________  For prescription refill requests, have your pharmacy contact our office. _______________________________________________________________  Recommendations made by the consultant and any test results will be sent to your referring physician. _______________________________________________________________ 

## 2018-04-24 NOTE — Progress Notes (Signed)
Patient tolerated port flush with no complaints voiced.  Port site clean and dry with no bruising or swelling noted at site.  No complaints of pain with flush.  Band aid applied.  VSS with discharge and left ambulatory with family and no s/s of distress noted.

## 2018-04-28 ENCOUNTER — Other Ambulatory Visit: Payer: Self-pay | Admitting: Surgery

## 2018-04-28 DIAGNOSIS — Z853 Personal history of malignant neoplasm of breast: Secondary | ICD-10-CM

## 2018-06-14 ENCOUNTER — Other Ambulatory Visit: Payer: Self-pay | Admitting: Pediatrics

## 2018-06-22 ENCOUNTER — Encounter (HOSPITAL_COMMUNITY): Payer: BLUE CROSS/BLUE SHIELD

## 2018-07-10 ENCOUNTER — Ambulatory Visit
Admission: RE | Admit: 2018-07-10 | Discharge: 2018-07-10 | Disposition: A | Discharge status: Home / Self Care | Payer: BLUE CROSS/BLUE SHIELD | Source: Ambulatory Visit | Attending: Surgery | Admitting: Surgery

## 2018-07-10 DIAGNOSIS — Z853 Personal history of malignant neoplasm of breast: Secondary | ICD-10-CM

## 2018-07-24 ENCOUNTER — Ambulatory Visit: Payer: BLUE CROSS/BLUE SHIELD | Admitting: Pediatrics

## 2018-07-24 ENCOUNTER — Encounter: Payer: Self-pay | Admitting: Pediatrics

## 2018-07-24 VITALS — BP 132/78 | HR 70 | Temp 96.8°F | Ht 64.0 in | Wt 202.6 lb

## 2018-07-24 DIAGNOSIS — R198 Other specified symptoms and signs involving the digestive system and abdomen: Secondary | ICD-10-CM

## 2018-07-24 DIAGNOSIS — R0989 Other specified symptoms and signs involving the circulatory and respiratory systems: Secondary | ICD-10-CM

## 2018-07-24 DIAGNOSIS — R319 Hematuria, unspecified: Secondary | ICD-10-CM | POA: Diagnosis not present

## 2018-07-24 DIAGNOSIS — F458 Other somatoform disorders: Secondary | ICD-10-CM | POA: Diagnosis not present

## 2018-07-24 DIAGNOSIS — N390 Urinary tract infection, site not specified: Secondary | ICD-10-CM | POA: Diagnosis not present

## 2018-07-24 DIAGNOSIS — L989 Disorder of the skin and subcutaneous tissue, unspecified: Secondary | ICD-10-CM | POA: Diagnosis not present

## 2018-07-24 NOTE — Progress Notes (Signed)
  Subjective:   Patient ID: Shannon Logan, female    DOB: 04-06-1966, 52 y.o.   MRN: [id number] CC: Follow-up (Uti)  HPI: Shannon Logan is a 52 y.o. female   Seen at urgent care 2 weeks ago for UTI.  She says she had a fair amount of blood and infection cells in her urine.  The doctor there wanted her rechecked by PCP.  She finished her course of Cipro.  She has not had any further symptoms.  Elevated BMI: Working hard at decreasing carbohydrates, fatty foods.  Eating lean meats fruits and vegetables primarily.  Walking regularly.  Has been pleased with weight loss.  Paresthesias: No worsening in the numbness around her mouth.  Some days is not there, some days is there a little bit, some days she notices it more but is not very bothersome.  She continues to have a globus sensation at times with swallowing.  She has a follow-up appoint with GI upcoming.  Sometimes she feels like her ear gets trapped, this is not at a time when she is eating or drinking usually.  She will have to swallow her burp to get the air bubble up.  She has a place on her right leg that has been there for about 2 years.  She fell 2 nights ago startled by a strange dog entering her house, sitting down hard and she has been a little more achy than usual since then.  Been able to walk fine.  Her left knee has been twinging some.  Relevant past medical, surgical, family and social history reviewed. Allergies and medications reviewed and updated. Social History   Tobacco Use  Smoking Status Former Smoker  . Last attempt to quit: 07/23/2008  . Years since quitting: 10.0  Smokeless Tobacco Never Used  Tobacco Comment   quit smoking 07/23/2008 after smoking 30 yrs. (1/2 pack a day)   ROS: Per HPI   Objective:    BP 132/78   Pulse 70   Temp (!) 96.8 F (36 C) (Oral)   Ht 5\' 4"  (4.967 m)   Wt 202 lb 9.6 oz (91.9 kg)   BMI 34.78 kg/m   Wt Readings from Last 3 Encounters:  07/24/18 202 lb 9.6 oz (91.9 kg)    04/17/18 204 lb 9.6 oz (92.8 kg)  02/20/18 215 lb 11.2 oz (97.8 kg)    Gen: NAD, alert, cooperative with exam, NCAT EYES: EOMI, no conjunctival injection, or no icterus CV: NRRR, normal S1/S2, no murmur, distal pulses 2+ b/l Resp: CTABL, no wheezes, normal WOB Abd: +BS, soft, NTND. Ext: No edema, warm Neuro: Alert and oriented, strength equal b/l UE and LE, coordination grossly normal Skin: 3 mm light brown to flesh-colored flattopped slightly raised papule anterior right lower leg.  Assessment & Plan:  Shannon Logan was seen today for follow-up UTI.  Diagnoses and all orders for this visit:  Recurrent UTI -     Urinalysis, Complete  Skin lesion -     Ambulatory referral to Dermatology  Hematuria, unspecified type Now resolved.  UA normal.  No longer with symptoms.  Let us know if symptoms return.  Globus sensation Has upcoming appoint with gastroenterology.  Any worsening let me know.  She remains on a PPI.  Follow up plan: Return in about 6 months (around 01/24/2019) for CPE. Shannon Found, MD Shannon Logan

## 2018-07-28 LAB — URINALYSIS, COMPLETE
BILIRUBIN UA: NEGATIVE
GLUCOSE, UA: NEGATIVE
KETONES UA: NEGATIVE
Leukocytes, UA: NEGATIVE
NITRITE UA: NEGATIVE
PROTEIN UA: NEGATIVE
RBC, UA: NEGATIVE
SPEC GRAV UA: 1.02 (ref 1.005–1.030)
UUROB: 0.2 mg/dL (ref 0.2–1.0)
pH, UA: 6 (ref 5.0–7.5)

## 2018-07-28 LAB — MICROSCOPIC EXAMINATION
Bacteria, UA: NONE SEEN
RBC, UA: NONE SEEN /hpf (ref 0–2)
RENAL EPITHEL UA: NONE SEEN /HPF

## 2018-08-12 ENCOUNTER — Encounter: Payer: Self-pay | Admitting: Physician Assistant

## 2018-08-12 ENCOUNTER — Ambulatory Visit (INDEPENDENT_AMBULATORY_CARE_PROVIDER_SITE_OTHER): Payer: BLUE CROSS/BLUE SHIELD | Admitting: Physician Assistant

## 2018-08-12 VITALS — BP 115/77 | HR 69 | Temp 97.9°F | Ht 64.0 in | Wt 204.0 lb

## 2018-08-12 DIAGNOSIS — N3001 Acute cystitis with hematuria: Secondary | ICD-10-CM

## 2018-08-12 DIAGNOSIS — Z87442 Personal history of urinary calculi: Secondary | ICD-10-CM

## 2018-08-12 DIAGNOSIS — Z87898 Personal history of other specified conditions: Secondary | ICD-10-CM

## 2018-08-12 DIAGNOSIS — Z87448 Personal history of other diseases of urinary system: Secondary | ICD-10-CM

## 2018-08-12 MED ORDER — NITROFURANTOIN MONOHYD MACRO 100 MG PO CAPS: 100.0000 mg | ORAL_CAPSULE | Freq: Two times a day (BID) | ORAL | 0 refills | Status: DC

## 2018-08-12 NOTE — Progress Notes (Signed)
BP 115/77   Pulse 69   Temp 97.9 F (36.6 C) (Oral)   Ht 5\' 4"  (1.626 m)   Wt 204 lb (92.5 kg)   BMI 35.02 kg/m    Subjective:    Patient ID: Shannon Logan, female    DOB: 30-Mar-1966, 52 y.o.   MRN: [id number]  HPI: Shannon Logan is a 52 y.o. female presenting on 08/12/2018 for Dysuria  This patient has had several days of dysuria, frequency and nocturia. There is also pain over the bladder in the suprapubic region, no back pain. Denies leakage or hematuria.  Denies fever or chills. No pain in flank area.  She has seen some red flesh-like pieces in urine. Has had stones in the past.  Years ago Dr. Rosana Hoes operated on this patient related to scar tissue in her urethra and bladder.  She reports that even as a young child there was some difficulty with her urethra.  She has not been to urology in quite some time.  I think it is time to make a referral.  Past Medical History:  Diagnosis Date  . Breast cancer of lower-outer quadrant of left female breast ([place]) 01/03/2017  . Cancer ([place]) 11/22/2016   Breast   . Family history of breast cancer   . GERD (gastroesophageal reflux disease)   . History of radiation therapy 07/30/17- 09/10/17   Left Breast 50 gy in 25 fractions, Left Breast boost 10 Gy in 5 fractions.   . Personal history of chemotherapy    from 3/18 till 7/18  . Personal history of radiation therapy    Relevant past medical, surgical, family and social history reviewed and updated as indicated. Interim medical history since our last visit reviewed. Allergies and medications reviewed and updated. DATA REVIEWED: CHART IN EPIC  Family History reviewed for pertinent findings.  Review of Systems  Constitutional: Negative.   HENT: Negative.   Eyes: Negative.   Respiratory: Negative.   Gastrointestinal: Negative.   Genitourinary: Positive for dysuria, enuresis, flank pain, frequency, hematuria and urgency. Negative for decreased urine volume, vaginal bleeding, vaginal  discharge and vaginal pain.    Allergies as of 08/12/2018      Reactions   Asa [aspirin]    Ate a lot of baby aspirin when she was small, mom was told she was allergic, not sure the reaction   Decadron [dexamethasone]    Throat swelling, difficulty breathing, numbness face, tongue, facial flushing   Prednisone    Numbness in face and arms   Sulfa Antibiotics       Medication List        Accurate as of 08/12/18 11:59 PM. Always use your most recent med list.          lidocaine-prilocaine cream Commonly known as:  EMLA Apply 1 application topically as needed.   loratadine 10 MG tablet Commonly known as:  CLARITIN TAKE 1 TABLET BY MOUTH EVERY DAY   MOTRIN PO Take by mouth.   nitrofurantoin (macrocrystal-monohydrate) 100 MG capsule Commonly known as:  MACROBID Take 1 capsule (100 mg total) by mouth 2 (two) times daily. 1 po BId   pantoprazole 40 MG tablet Commonly known as:  PROTONIX Take 1 tablet (40 mg total) by mouth daily.          Objective:    BP 115/77   Pulse 69   Temp 97.9 F (36.6 C) (Oral)   Ht 5\' 4"  (1.626 m)   Wt 204 lb (92.5 kg)  BMI 35.02 kg/m   Allergies  Allergen Reactions  . Asa [Aspirin]     Ate a lot of baby aspirin when she was small, mom was told she was allergic, not sure the reaction  . Decadron [Dexamethasone]     Throat swelling, difficulty breathing, numbness face, tongue, facial flushing  . Prednisone     Numbness in face and arms  . Sulfa Antibiotics     Wt Readings from Last 3 Encounters:  08/12/18 204 lb (92.5 kg)  07/24/18 202 lb 9.6 oz (91.9 kg)  04/17/18 204 lb 9.6 oz (92.8 kg)    Physical Exam  Constitutional: She is oriented to person, place, and time. She appears well-developed and well-nourished.  HENT:  Head: Normocephalic and atraumatic.  Eyes: Pupils are equal, round, and reactive to light. Conjunctivae are normal.  Cardiovascular: Normal rate, regular rhythm, normal heart sounds and intact distal pulses.    Pulmonary/Chest: Effort normal and breath sounds normal.  Abdominal: Soft. Bowel sounds are normal. She exhibits no distension and no mass. There is tenderness in the suprapubic area. There is no rebound, no guarding and no CVA tenderness.  Neurological: She is alert and oriented to person, place, and time. She has normal reflexes.  Skin: Skin is warm and dry. No rash noted.  Psychiatric: She has a normal mood and affect. Her behavior is normal. Judgment and thought content normal.        Assessment & Plan:   1. Acute cystitis with hematuria - nitrofurantoin, macrocrystal-monohydrate, (MACROBID) 100 MG capsule; Take 1 capsule (100 mg total) by mouth 2 (two) times daily. 1 po BId  Dispense: 14 capsule; Refill: 0 - Ambulatory referral to Urology  2. History of kidney stones - Ambulatory referral to Urology  3. History of gross hematuria - Ambulatory referral to Urology   Continue all other maintenance medications as listed above.  Follow up plan: No follow-ups on file.  Educational handout given for [place] PA-C [facility] [address]  [place], [place] [address] [phone]   08/13/2018, 7:48 AM

## 2018-08-13 DIAGNOSIS — Z87448 Personal history of other diseases of urinary system: Secondary | ICD-10-CM | POA: Insufficient documentation

## 2018-08-13 DIAGNOSIS — Z87442 Personal history of urinary calculi: Secondary | ICD-10-CM | POA: Insufficient documentation

## 2018-08-13 DIAGNOSIS — Z87898 Personal history of other specified conditions: Secondary | ICD-10-CM | POA: Insufficient documentation

## 2018-08-21 ENCOUNTER — Ambulatory Visit (INDEPENDENT_AMBULATORY_CARE_PROVIDER_SITE_OTHER): Payer: BLUE CROSS/BLUE SHIELD | Admitting: Internal Medicine

## 2018-08-21 ENCOUNTER — Encounter (INDEPENDENT_AMBULATORY_CARE_PROVIDER_SITE_OTHER): Payer: Self-pay | Admitting: Internal Medicine

## 2018-08-21 ENCOUNTER — Other Ambulatory Visit: Payer: Self-pay | Admitting: Surgery

## 2018-08-21 VITALS — BP 148/88 | HR 72 | Temp 97.9°F | Ht 65.0 in | Wt 204.4 lb

## 2018-08-21 DIAGNOSIS — K219 Gastro-esophageal reflux disease without esophagitis: Secondary | ICD-10-CM

## 2018-08-21 NOTE — Progress Notes (Signed)
Subjective:    Patient ID: Shannon Logan, female    DOB: Aug 07, 1966, 52 y.o.   MRN: [id number]  HPI Here today for f/u. Last seen in September of 2018. Hx of chronic GERD Recent hx of breast cancer and underwent a lumpectomy in February 2018. Is followed by the Cancer in [place].  She has stopped the Claritin and her throat is better. No dysphagia.  Her appetite is good. She has weight loss of 14 pounds which was intentional. BM x 1 a day.  GERD controlled with Protonix.  Has questions about some numbness in her face and thought maybe in was the Protonix.   10/26/2015: Procedure: EGD &Colonoscopy  Indications:Patient is 52 year old Caucasian female who presents with chronic right upper quadrant postprandial pain along with bloating. Ultrasound negative for cholelithiasis and HIDA scan revealed normal EF. She also has intermittent heartburn controlled with when necessary Zantac. She is undergoing diagnostic EGD followed by average risk screening colonoscopy.   Impression:  ED findings: Erosive reflux esophagitis. Small sliding hiatal hernia. No evidence of gastritis or peptic ulcer disease.  Colonoscopy findings: Normal colonoscopy except external hemorrhoids and anal papillae.   Review of Systems Past Medical History:  Diagnosis Date  . Breast cancer of lower-outer quadrant of left female breast ([facility]) 01/03/2017  . Cancer ([facility]) 11/22/2016   Breast   . Family history of breast cancer   . GERD (gastroesophageal reflux disease)   . History of radiation therapy 07/30/17- 09/10/17   Left Breast 50 gy in 25 fractions, Left Breast boost 10 Gy in 5 fractions.   . Personal history of chemotherapy    from 3/18 till 7/18  . Personal history of radiation therapy     Past Surgical History:  Procedure Laterality Date  . BREAST LUMPECTOMY Left 01/22/17 and 01/30/17  . BREAST LUMPECTOMY WITH AXILLARY LYMPH NODE DISSECTION Left 01/30/2017   Procedure: RE-EXCISION OF LEFT BREAST  LUMPECTOMY AND LEFT AXILLARY LYMPH NODE DISSECTION;  Surgeon: Coralie Keens, MD;  Location: [facility];  Service: General;  Laterality: Left;  . BREAST LUMPECTOMY WITH RADIOACTIVE SEED AND SENTINEL LYMPH NODE BIOPSY Left 01/22/2017   Procedure: BREAST LUMPECTOMY WITH RADIOACTIVE SEED AND SENTINEL LYMPH NODE BIOPSY;  Surgeon: Coralie Keens, MD;  Location: [facility];  Service: General;  Laterality: Left;  . COLONOSCOPY N/A 10/26/2015   Procedure: COLONOSCOPY;  Surgeon: Rogene Houston, MD;  [facility];  Service: Endoscopy;  Laterality: N/A;  . DILATION AND CURETTAGE OF UTERUS    . ESOPHAGOGASTRODUODENOSCOPY N/A 10/26/2015   Procedure: ESOPHAGOGASTRODUODENOSCOPY (EGD);  Surgeon: Rogene Houston, MD;  [facility];  Service: Endoscopy;  Laterality: N/A;  1:00  . PORTACATH PLACEMENT Right 01/22/2017   Procedure: INSERTION PORT-A-CATH;  Surgeon: Coralie Keens, MD;  Location: [facility];  Service: General;  Laterality: Right;  . URETHRAL DILATION      Allergies  Allergen Reactions  . Asa [Aspirin]     Ate a lot of baby aspirin when she was small, mom was told she was allergic, not sure the reaction  . Decadron [Dexamethasone]     Throat swelling, difficulty breathing, numbness face, tongue, facial flushing  . Prednisone     Numbness in face and arms  . Sulfa Antibiotics     Current Outpatient Medications on File Prior to Visit  Medication Sig Dispense Refill  . Ibuprofen (MOTRIN PO) Take by mouth.    . lidocaine-prilocaine (EMLA) cream Apply 1 application topically  as needed. 30 g 0  . pantoprazole (PROTONIX) 40 MG tablet Take 1 tablet (40 mg total) by mouth daily. 90 tablet 3   No current facility-administered medications on file prior to visit.         Objective:   Physical Exam Blood pressure (!) 148/88, pulse 72, temperature 97.9 F (36.6 C), height 5\' 5"  (1.651 m), weight 204 lb 6.4 oz (92.7 kg). Alert and  oriented. Skin warm and dry. Oral mucosa is moist.   . Sclera anicteric, conjunctivae is pink. Thyroid not enlarged. No cervical lymphadenopathy. Lungs clear. Heart regular rate and rhythm.  Abdomen is soft. Bowel sounds are positive. No hepatomegaly. No abdominal masses felt. No tenderness.  No edema to lower extremities.          Assessment & Plan:  GERD: Will give samples of Dexilant x 1 months. If numbness resolves will send an Rx in for Dexilant. If not, will resume the Protonix. OV in 1 year.

## 2018-08-21 NOTE — Patient Instructions (Signed)
OV in 1 year.  

## 2018-08-24 ENCOUNTER — Encounter (HOSPITAL_COMMUNITY): Payer: BLUE CROSS/BLUE SHIELD

## 2018-08-25 ENCOUNTER — Ambulatory Visit (INDEPENDENT_AMBULATORY_CARE_PROVIDER_SITE_OTHER): Payer: BLUE CROSS/BLUE SHIELD | Admitting: Internal Medicine

## 2018-09-01 IMAGING — US US BREAST BX W LOC DEV 1ST LESION IMG BX SPEC US GUIDE*L*
1 series · 11 of 11 positions shown · non-contrast
Comparison: Previous exam(s).

ADDENDUM:
Pathology revealed grade II invasive ductal carcinoma in the left
breast. This was found to be concordant by Dr. Andromahi Servetas.
Pathology results were discussed with the patient by telephone. The
patient reported doing well after the biopsy with tenderness at the
site. Post biopsy instructions and care were reviewed and questions
were answered. The patient was encouraged to call The [REDACTED]
consultation has been arranged with Dr. Vaxto Kiknavelidze at [REDACTED] on December 04, 2016.

Pathology results reported by Perlova Person RN, BSN on 11/22/2016.
CLINICAL DATA: Patient presents for ultrasound-guided core needle
biopsy of a 1.4 cm mass with irregular shape and borders and
associated microcalcifications over the [DATE] position of the left
breast 8 cm from the nipple.
EXAM:
ULTRASOUND GUIDED LEFT BREAST CORE NEEDLE BIOPSY

[Series 1: us breast bx w loc dev 1st lesion img bx spec us g · 0.07mm/px · 11 of 11 slices shown]
[im 1/11]
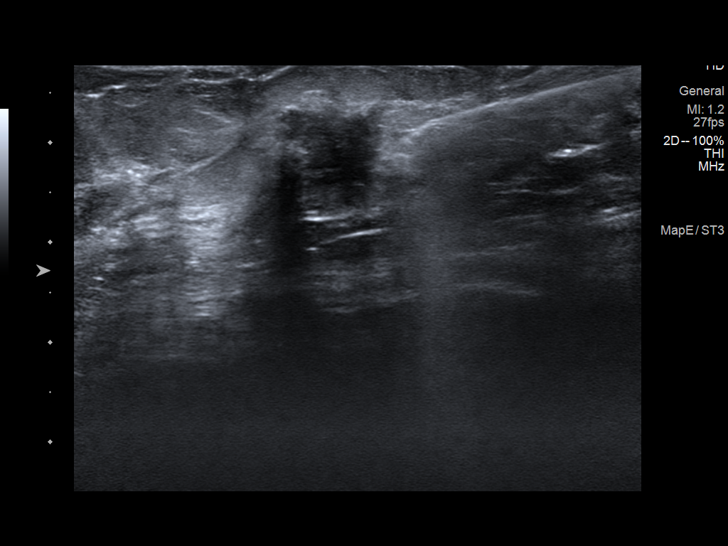
[im 2/11]
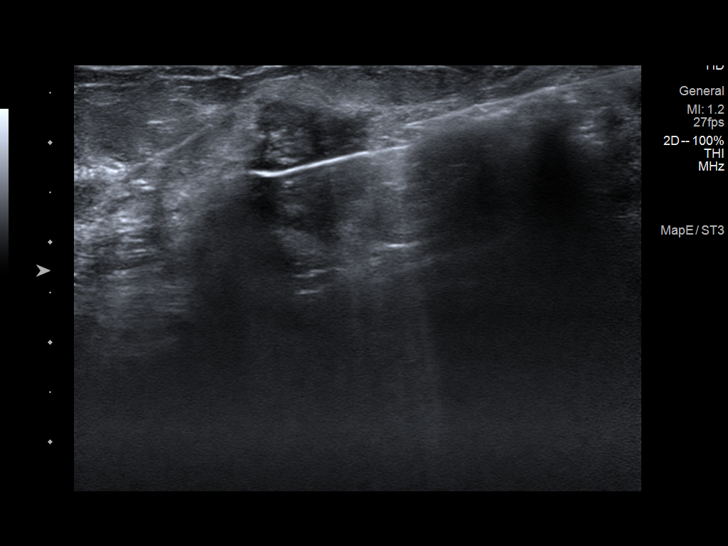
[im 3/11]
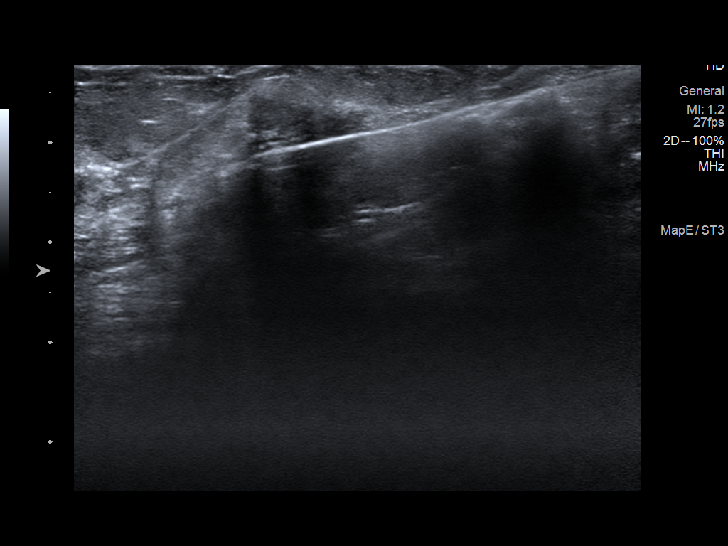
[im 4/11]
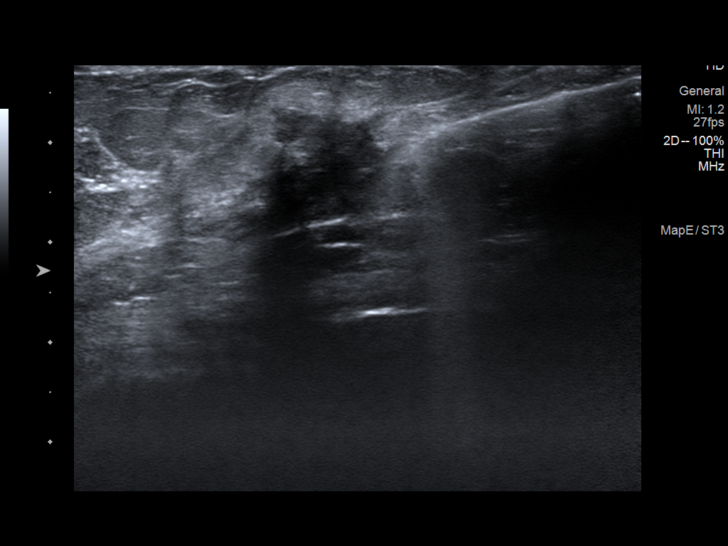
[im 5/11]
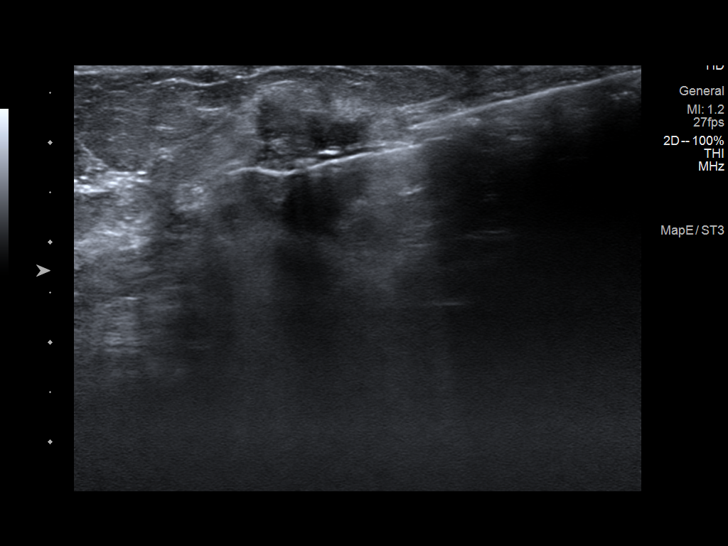
[im 6/11]
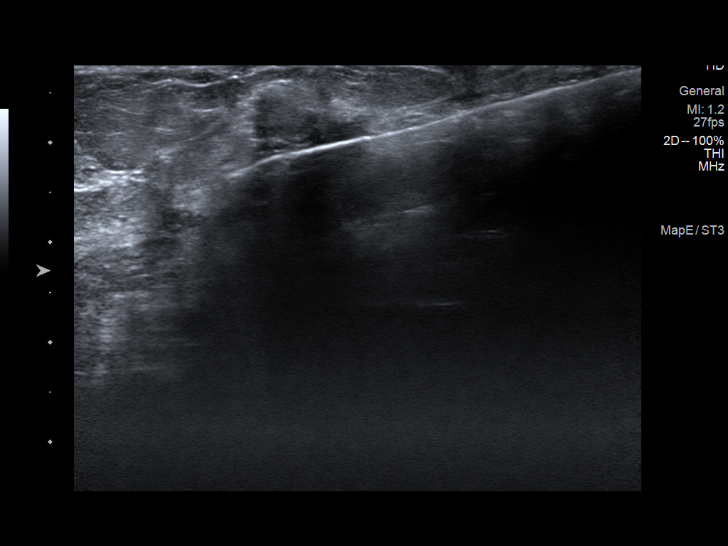
[im 7/11]
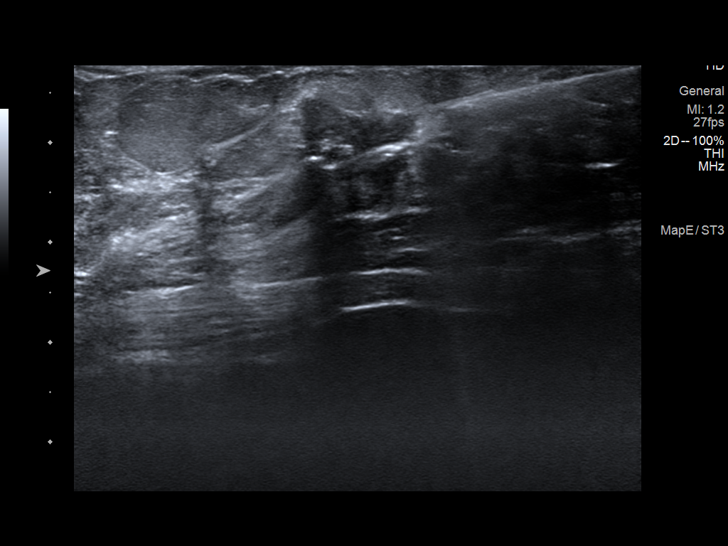
[im 8/11]
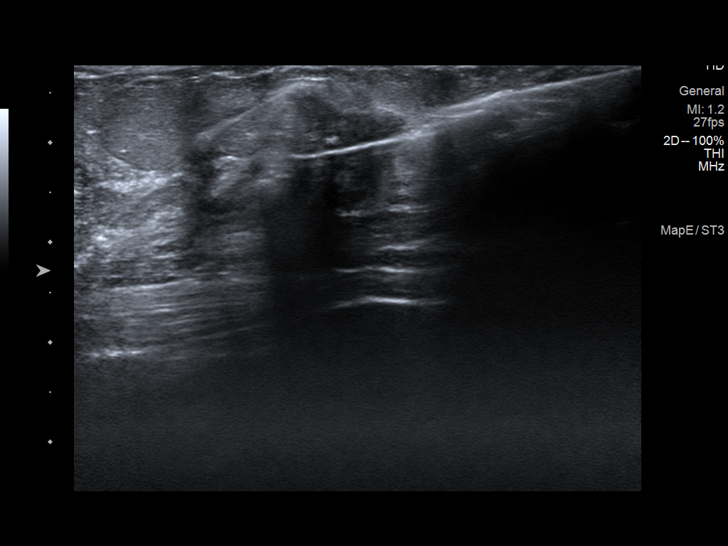
[im 9/11]
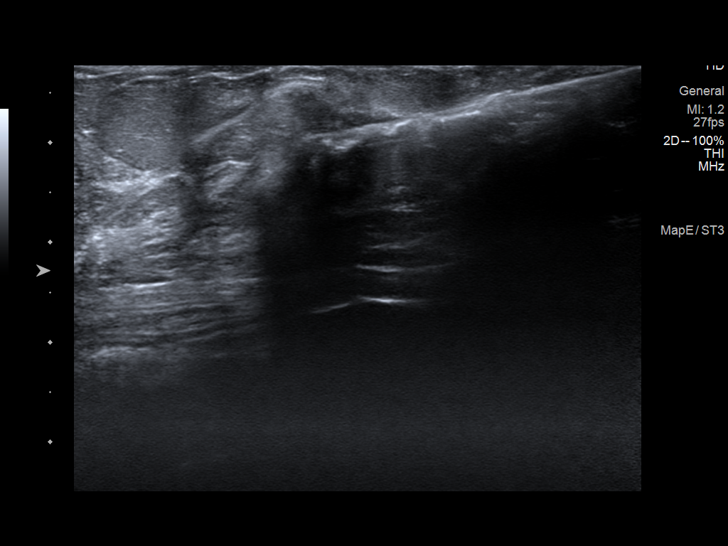
[im 10/11]
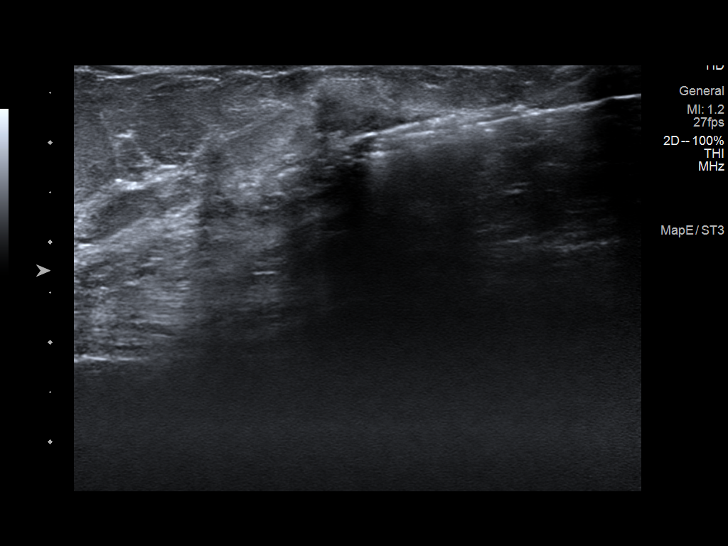
[im 11/11]
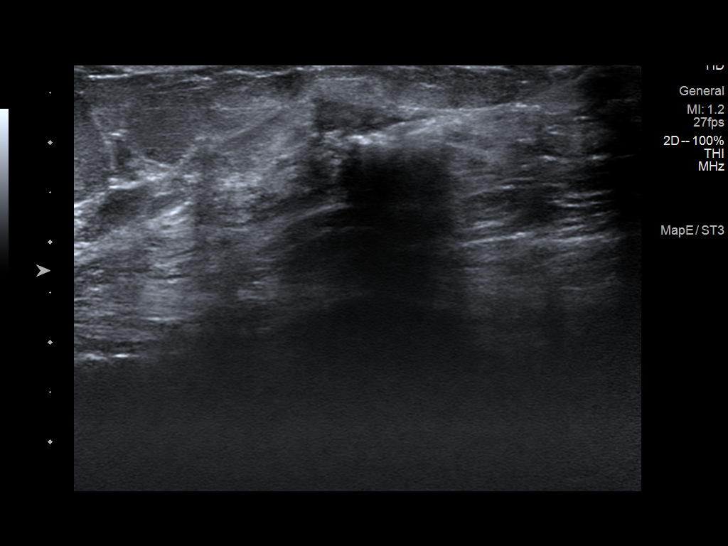

[11 of 11 positions shown; findings below may reference images not displayed]



Using sterile technique and 1% Lidocaine as local anesthetic, under
direct ultrasound visualization, a 12 gauge Belkys biopsy
device was used to perform biopsy of the targeted mass at the [DATE]
position of the left breast using a inferior to superior approach.
Three adequate tissue specimens were obtained. At the conclusion of
the procedure a coil shaped tissue marker clip was deployed into the
biopsy cavity. Follow up 2 view mammogram was performed and dictated
separately.
IMPRESSION: Ultrasound guided biopsy of highly suspicious left breast mass. No
apparent complications.

## 2018-09-04 ENCOUNTER — Inpatient Hospital Stay (HOSPITAL_COMMUNITY): Payer: BLUE CROSS/BLUE SHIELD | Attending: Internal Medicine

## 2018-09-04 VITALS — BP 122/69 | HR 70 | Temp 98.5°F | Resp 18

## 2018-09-04 DIAGNOSIS — C50512 Malignant neoplasm of lower-outer quadrant of left female breast: Secondary | ICD-10-CM | POA: Insufficient documentation

## 2018-09-04 DIAGNOSIS — Z171 Estrogen receptor negative status [ER-]: Secondary | ICD-10-CM

## 2018-09-04 DIAGNOSIS — Z452 Encounter for adjustment and management of vascular access device: Secondary | ICD-10-CM | POA: Diagnosis not present

## 2018-09-04 DIAGNOSIS — Z95828 Presence of other vascular implants and grafts: Secondary | ICD-10-CM

## 2018-09-04 MED ORDER — SODIUM CHLORIDE 0.9% FLUSH
10.0000 mL | Freq: Once | INTRAVENOUS | Status: AC
  Administered 2018-09-04: 10 mL via INTRAVENOUS

## 2018-09-04 MED ORDER — HEPARIN SOD (PORK) LOCK FLUSH 100 UNIT/ML IV SOLN
500.0000 [IU] | Freq: Once | INTRAVENOUS | Status: AC
  Administered 2018-09-04: 500 [IU] via INTRAVENOUS

## 2018-09-04 NOTE — Patient Instructions (Signed)
[facility]  Discharge Instructions:  Your port was flushed today. _______________________________________________________________  Thank you for choosing [facility] to provide your oncology and hematology care.  To afford each patient quality time with our providers, please arrive at least 15 minutes before your scheduled appointment.  You need to re-schedule your appointment if you arrive 10 or more minutes late.  We strive to give you quality time with our providers, and arriving late affects you and other patients whose appointments are after yours.  Also, if you no show three or more times for appointments you may be dismissed from the clinic.  Again, thank you for choosing [facility]. Our hope is that these requests will allow you access to exceptional care and in a timely manner. _______________________________________________________________  If you have questions after your visit, please contact our office at [phone] between the hours of 8:30 a.m. and 5:00 p.m. Voicemails left after 4:30 p.m. will not be returned until the following business day. _______________________________________________________________  For prescription refill requests, have your pharmacy contact our office. _______________________________________________________________  Recommendations made by the consultant and any test results will be sent to your referring physician. _______________________________________________________________ 

## 2018-09-04 NOTE — Progress Notes (Signed)
Patients port flushed with no complaints of pain.  Site clean and dry with no bruising or swelling noted at site.  Good blood return noted.  Band aid applied.  VSs with discharge and left ambulatory with no s/s of distress noted.

## 2018-09-11 ENCOUNTER — Telehealth (INDEPENDENT_AMBULATORY_CARE_PROVIDER_SITE_OTHER): Payer: Self-pay | Admitting: *Deleted

## 2018-09-11 NOTE — Telephone Encounter (Signed)
patine called in -- dexilant samples have worked, she would like Rx sent [facility] in [place]

## 2018-09-14 ENCOUNTER — Encounter (INDEPENDENT_AMBULATORY_CARE_PROVIDER_SITE_OTHER): Payer: Self-pay | Admitting: *Deleted

## 2018-09-14 ENCOUNTER — Telehealth (INDEPENDENT_AMBULATORY_CARE_PROVIDER_SITE_OTHER): Payer: Self-pay | Admitting: Internal Medicine

## 2018-09-14 DIAGNOSIS — K219 Gastro-esophageal reflux disease without esophagitis: Secondary | ICD-10-CM

## 2018-09-14 MED ORDER — DEXLANSOPRAZOLE 60 MG PO CPDR: 60.0000 mg | DELAYED_RELEASE_CAPSULE | Freq: Every day | ORAL | 3 refills | Status: DC

## 2018-09-14 NOTE — Telephone Encounter (Signed)
Patient aware.

## 2018-09-14 NOTE — Telephone Encounter (Signed)
This encounter was created in error - please disregard.

## 2018-09-14 NOTE — Telephone Encounter (Signed)
Rx sent to her pharmacy 

## 2018-09-14 NOTE — Telephone Encounter (Signed)
Please let patient know I sent an MM

## 2018-09-17 ENCOUNTER — Telehealth (INDEPENDENT_AMBULATORY_CARE_PROVIDER_SITE_OTHER): Payer: Self-pay | Admitting: Internal Medicine

## 2018-09-17 DIAGNOSIS — K219 Gastro-esophageal reflux disease without esophagitis: Secondary | ICD-10-CM

## 2018-09-17 MED ORDER — PANTOPRAZOLE SODIUM 40 MG PO TBEC: 40.0000 mg | DELAYED_RELEASE_TABLET | Freq: Every day | ORAL | 3 refills | Status: AC

## 2018-09-17 NOTE — Telephone Encounter (Signed)
Rx for Protonix sent to her pharmacy. 

## 2018-10-16 ENCOUNTER — Ambulatory Visit: Payer: BLUE CROSS/BLUE SHIELD | Admitting: Urology

## 2018-10-16 ENCOUNTER — Other Ambulatory Visit (HOSPITAL_COMMUNITY)
Admission: AD | Admit: 2018-10-16 | Discharge: 2018-10-16 | Disposition: A | Discharge status: Home / Self Care | Payer: BLUE CROSS/BLUE SHIELD | Source: Skilled Nursing Facility | Attending: Urology | Admitting: Urology

## 2018-10-16 DIAGNOSIS — R3912 Poor urinary stream: Secondary | ICD-10-CM

## 2018-10-16 DIAGNOSIS — Z87440 Personal history of urinary (tract) infections: Secondary | ICD-10-CM | POA: Diagnosis not present

## 2018-10-16 DIAGNOSIS — N952 Postmenopausal atrophic vaginitis: Secondary | ICD-10-CM | POA: Diagnosis not present

## 2018-10-16 LAB — URINALYSIS, COMPLETE (UACMP) WITH MICROSCOPIC
BACTERIA UA: NONE SEEN
BILIRUBIN URINE: NEGATIVE
Glucose, UA: NEGATIVE mg/dL
HGB URINE DIPSTICK: NEGATIVE
Ketones, ur: NEGATIVE mg/dL
LEUKOCYTES UA: NEGATIVE
NITRITE: NEGATIVE
PROTEIN: NEGATIVE mg/dL
Specific Gravity, Urine: 1.011 (ref 1.005–1.030)
pH: 6 (ref 5.0–8.0)

## 2018-12-04 ENCOUNTER — Ambulatory Visit (HOSPITAL_COMMUNITY): Payer: BLUE CROSS/BLUE SHIELD | Admitting: Internal Medicine

## 2018-12-07 ENCOUNTER — Inpatient Hospital Stay (HOSPITAL_COMMUNITY): Payer: BLUE CROSS/BLUE SHIELD | Attending: Internal Medicine | Admitting: Internal Medicine

## 2018-12-07 ENCOUNTER — Other Ambulatory Visit: Payer: Self-pay | Admitting: Internal Medicine

## 2018-12-07 ENCOUNTER — Encounter (HOSPITAL_COMMUNITY): Payer: Self-pay | Admitting: Internal Medicine

## 2018-12-07 ENCOUNTER — Encounter (INDEPENDENT_AMBULATORY_CARE_PROVIDER_SITE_OTHER): Payer: Self-pay

## 2018-12-07 ENCOUNTER — Other Ambulatory Visit: Payer: Self-pay

## 2018-12-07 VITALS — BP 130/74 | HR 74 | Temp 98.1°F | Resp 14 | Wt 217.7 lb

## 2018-12-07 DIAGNOSIS — R200 Anesthesia of skin: Secondary | ICD-10-CM

## 2018-12-07 DIAGNOSIS — Z791 Long term (current) use of non-steroidal anti-inflammatories (NSAID): Secondary | ICD-10-CM | POA: Diagnosis not present

## 2018-12-07 DIAGNOSIS — C50512 Malignant neoplasm of lower-outer quadrant of left female breast: Secondary | ICD-10-CM | POA: Diagnosis not present

## 2018-12-07 DIAGNOSIS — Z171 Estrogen receptor negative status [ER-]: Secondary | ICD-10-CM | POA: Diagnosis not present

## 2018-12-07 DIAGNOSIS — C773 Secondary and unspecified malignant neoplasm of axilla and upper limb lymph nodes: Secondary | ICD-10-CM | POA: Diagnosis not present

## 2018-12-07 DIAGNOSIS — Z923 Personal history of irradiation: Secondary | ICD-10-CM

## 2018-12-07 DIAGNOSIS — Z9221 Personal history of antineoplastic chemotherapy: Secondary | ICD-10-CM | POA: Diagnosis not present

## 2018-12-07 DIAGNOSIS — Z8249 Family history of ischemic heart disease and other diseases of the circulatory system: Secondary | ICD-10-CM | POA: Diagnosis not present

## 2018-12-07 DIAGNOSIS — Z803 Family history of malignant neoplasm of breast: Secondary | ICD-10-CM | POA: Diagnosis not present

## 2018-12-07 DIAGNOSIS — Z87891 Personal history of nicotine dependence: Secondary | ICD-10-CM | POA: Diagnosis not present

## 2018-12-07 NOTE — Progress Notes (Signed)
Diagnosis No diagnosis found.  Staging Cancer Staging Breast cancer of lower-outer quadrant of left female breast (HCC) Staging form: Breast, AJCC 8th Edition - Pathologic stage from 02/05/2017: Stage IIIA (pT2, pN1a, cM0, G3, ER: Negative, PR: Negative, HER2: Negative) - Signed by Kefalas, Thomas S, PA-C on 02/05/2017   Assessment and Plan:  1. Stage IIIA (pT2pN1AM0) left breast cancer in the lower-outer quadrant, ER/PR/HER2 NEGATIVE, S/P left lumpectomy with positive margins by Dr. Blackman on 01/22/2017 with one positive left axillary node with positive for metastatic disease resulting in a re-excision on 01/30/2017 and left axillary lymph node dissection with invasive component focally 0.1-0.2 cm away from anterior margin but inked margin surface is negative and 0/6 benign lymph nodes.  Endocrine panel doen 11/21/2018 was ER negative at 0%, PR negative at 0%, Her 2 negative by FISH.  Ki 67 40%.   Adjuvant dose dense AC q2weeks x4 cycles followed by weekly taxol x 12 cycles from 02/13/17-06/30/17. Adjuvant RT completed on 09/10/17. Pt was previously followed by Dr. Zhou.  Pt had bilateral diagnostic mammograms done 07/04/2018 that was negative.  She is due for bilateral diagnostic mammogram in 06/2018.  Pt reports she will have insurance change and per insurance will need to establish at [facility].  Will ask for practice administrators to reach out to pt as she desires to remain with [facility].  Pending their discussion, she will RTC in 06/2019 or records will be sent to [facility] for appointment.    2.  Facial numbness.  She reports this is intermittent.  She was seen by Dr. Willis of neurology.  Pt had MRI of brain done 03/06/2018 that showed:   IMPRESSION: This MRI of the brain with and without contrast shows the following: 1.    There is a single left parietal T2/FLAIR hyperintense periventricular white matter focus.  This is of unknown significance and could represent a focus of chronic  microvascular ischemic change or demyelination. 2.    There is a normal enhancement pattern and there are no acute findings.  CT CAP done 02/11/2017 showed IMPRESSION: 1. Postoperative changes of recent left lumpectomy and left axillary lymph node dissection, without evidence of metastatic disease. 2. Aortic atherosclerosis (ICD10-170.0). Coronary artery calcification. 3. Liver appears fatty.  Pt should continue to follow-up with neurology as directed.  She reports she has repeat MRI planned for 02/2019.  Will follow-up results.    3.  Family history of breast cancer.  Patient has undergone genetic testing.  4   Pending insurance change.  Will ask for practice administrators to reach out to pt as she desires to remain with [facility].  Pending their discussion, she will RTC in 06/2019 or records will be sent to [facility] for appointment.    25 minutes spent with more than 50% spent in counseling and coordination of care.    Interval History:  52 y.o. female with Stage IIIA (pT2pN1AM0) invasive left breast cancer in the lower-outer quadrant, ER/PR/HER2 NEGATIVE, S/P left breast lumpectomy with positive margins by Dr. Blackman on 01/22/2017 with one positive left axillary lymph node positive for metastatic disease resulting in a re-excision on 01/30/2017 and left axillary lymph node dissection with invasive component focally 0.1- 0.2 cm away from anterior margin, but inked margin surface is negative and 0/6 lymph nodes.  Patient was treated with RT with 50 Gy in 25 fractions, followed by 10 Gy boost in 5 fractions, completed on 09/10/2017 under the care of Dr. Squire.  Current   Status:  Pt is seen today for follow-up.  She continues to report facial numbness.  She is followed by neurology and reportedly had negative MRI and work-up and has been diagnosed with cluster headache.      Breast cancer of lower-outer quadrant of left female breast ([place])   11/19/2016 Mammogram    In the lateral aspect of  the left breast, posterior depth, there is an irregular spiculated mass measuring approximately 1.8 cm. There are a few pleomorphic calcifications in a linear distribution within the mass and extending slightly anterior to the mass. All together the mass and calcifications span approximately 2.5 cm.    11/21/2016 Procedure    Breast, left, needle core biopsy, 3:30 o'clock, 8 cm fn    11/22/2016 Pathology Results    Breast, left, needle core biopsy, 3:30 o'clock, 8 cm fn - INVASIVE DUCTAL CARCINOMA, GRADE 2.    01/03/2017 Initial Diagnosis    Breast cancer of lower-outer quadrant of left female breast ([place])    01/22/2017 Procedure    Left breast lumpectomy and left axillary sentinel node biopsy by Dr. Ninfa Linden    01/22/2017 Procedure    Port placed by Dr. Ninfa Linden    01/23/2017 Pathology Results    1. Breast, lumpectomy, Left - INVASIVE GRADE 3 DUCTAL CARCINOMA, SPANNING 2.2 CM IN GREATEST DIMENSION. 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 - LYMPH/VASCULAR INVASION IS PRESENT. - ANTERIOR / MEDIAL MARGIN IS FOCALLY POSITIVE FOR INVASIVE DUCTAL CARCINOMA AND ANTERIOR MARGIN IS BROADLY POSITIVE FOR INVASIVE DUCTAL CARCINOMA. - DUCTAL CARCINOMA IN SITU IS FOCALLY LESS THAN 0.1 CM TO POSTERIOR MARGIN. - OTHER MARGINS ARE NEGATIVE. - SEE ONCOLOGY TEMPLATE. 2. Lymph node, sentinel, biopsy, Left axillary - ONE BENIGN LYMPH NODE WITH NO TUMOR SEEN (0/1). 3. Lymph node, sentinel, biopsy, Left axillary - ONE LYMPH NODE POSITIVE FOR METASTATIC DUCTAL CARCINOMA (1/1).    01/30/2017 Procedure    Breast, excision, Left, new anterior and medial margins and Lymph nodes, regional resection, Left axillary contents    01/31/2017 Pathology Results    Diagnosis 1. Breast, excision, Left, new anterior and medial margins - RESIDUAL INVASIVE DUCTAL CARCINOMA AND EXTENSIVE RESIDUAL DUCTAL CARCINOMA IN SITU. - INVASIVE DUCTAL CARCINOMA IS FOCALLY 0.1 TO 0.2 CM AWAY FROM ANTERIOR  MARGIN BUT INKED MARGINAL SURFACE IS NEGATIVE. - DUCTAL CARCINOMA IN SITU IS FOCALLY LESS THAN 0.1 CM AWAY FROM ANTERIOR MARGIN AND FOCALLY LESS THAN 0.1 CM AWAY FROM MEDIAL ASPECT OF ANTERIOR MARGIN. - SEE COMMENT. 2. Lymph nodes, regional resection, Left axillary contents - SIX BENIGN LYMPH NODES WITH NO TUMOR SEEN (0/6).    02/10/2017 Imaging    MUGA- LEFT ventricular ejection fraction of 54%.  No focal wall motion abnormalities.     02/12/2017 Imaging    CT CAP- 1. Postoperative changes of recent left lumpectomy and left axillary lymph node dissection, without evidence of metastatic disease. 2. Aortic atherosclerosis (ICD10-170.0). Coronary artery calcification. 3. Liver appears fatty.    02/13/2017 -  Chemotherapy    The patient had DOXOrubicin (ADRIAMYCIN) chemo injection 126 mg, 60 mg/m2 = 126 mg, Intravenous,  Once, 1 of 4 cycles  palonosetron (ALOXI) injection 0.25 mg, 0.25 mg, Intravenous,  Once, 1 of 4 cycles  pegfilgrastim (NEULASTA ONPRO KIT) injection 6 mg, 6 mg, Subcutaneous, Once, 1 of 5 cycles  cyclophosphamide (CYTOXAN) 1,260 mg in sodium chloride 0.9 % 250 mL chemo infusion, 600 mg/m2 = 1,260 mg, Intravenous,  Once, 1 of 4 cycles  PACLitaxel (TAXOL) 168  mg in dextrose 5 % 250 mL chemo infusion ( fosaprepitant (EMEND) 150 mg, dexamethasone (DECADRON) 12 mg in sodium chloride 0.9 % 145 mL IVPB, , Intravenous,  Once, 1 of 4 cycles  for chemotherapy treatment.        Problem List Patient Active Problem List   Diagnosis Date Noted  . History of kidney stones [Z87.442] 08/13/2018  . History of gross hematuria [Z87.448] 08/13/2018  . Epistaxis, recurrent [R04.0] 08/22/2017  . Gastroesophageal reflux disease [K21.9] 08/22/2017  . Pharyngeal dysphagia [R13.13] 08/22/2017  . Genetic testing [Z13.79] 01/20/2017  . Breast cancer of lower-outer quadrant of left female breast ([place]) [C50.512] 01/03/2017  . Family history of breast cancer [Z80.3]   . Prediabetes  [R73.03] 10/10/2016  . Hypertriglyceridemia [E78.1] 04/05/2016  . BMI 32.0-32.9,adult [Z68.32] 09/22/2015  . Abdominal pain, right upper quadrant [R10.11] 07/11/2015    Past Medical History Past Medical History:  Diagnosis Date  . Breast cancer of lower-outer quadrant of left female breast ([place]) 01/03/2017  . Cancer ([place]) 11/22/2016   Breast   . Family history of breast cancer   . GERD (gastroesophageal reflux disease)   . History of radiation therapy 07/30/17- 09/10/17   Left Breast 50 gy in 25 fractions, Left Breast boost 10 Gy in 5 fractions.   . Personal history of chemotherapy    from 3/18 till 7/18  . Personal history of radiation therapy     Past Surgical History Past Surgical History:  Procedure Laterality Date  . BREAST LUMPECTOMY Left 01/22/17 and 01/30/17  . BREAST LUMPECTOMY WITH AXILLARY LYMPH NODE DISSECTION Left 01/30/2017   Procedure: RE-EXCISION OF LEFT BREAST LUMPECTOMY AND LEFT AXILLARY LYMPH NODE DISSECTION;  Surgeon: Coralie Keens, MD;  Location: [facility];  Service: General;  Laterality: Left;  . BREAST LUMPECTOMY WITH RADIOACTIVE SEED AND SENTINEL LYMPH NODE BIOPSY Left 01/22/2017   Procedure: BREAST LUMPECTOMY WITH RADIOACTIVE SEED AND SENTINEL LYMPH NODE BIOPSY;  Surgeon: Coralie Keens, MD;  Location: [facility];  Service: General;  Laterality: Left;  . COLONOSCOPY N/A 10/26/2015   Procedure: COLONOSCOPY;  Surgeon: Rogene Houston, MD;  [facility];  Service: Endoscopy;  Laterality: N/A;  . DILATION AND CURETTAGE OF UTERUS    . ESOPHAGOGASTRODUODENOSCOPY N/A 10/26/2015   Procedure: ESOPHAGOGASTRODUODENOSCOPY (EGD);  Surgeon: Rogene Houston, MD;  [facility];  Service: Endoscopy;  Laterality: N/A;  1:00  . PORTACATH PLACEMENT Right 01/22/2017   Procedure: INSERTION PORT-A-CATH;  Surgeon: Coralie Keens, MD;  Location: [facility];  Service: General;  Laterality: Right;  . URETHRAL DILATION       Family History Family History  Problem Relation Age of Onset  . Hypertension Mother   . Hyperlipidemia Mother   . Heart disease Father   . Hypertension Father   . Diabetes Father   . Aneurysm Maternal Grandmother   . Stroke Maternal Grandfather   . Heart disease Paternal Grandfather   . Breast cancer Other        PGF's sister  . Breast cancer Other        PGF's sister  . Breast cancer Other        PGF's aunt (great, great aunt)  . Breast cancer Other        PGM's mother     Social History  reports that she quit smoking about 10 years ago. She has never used smokeless tobacco. She reports current alcohol use. She reports that she does not use drugs.  Medications  Current Outpatient Medications:  .  Ibuprofen (MOTRIN PO), Take 200 mg by mouth as needed. , Disp: , Rfl:  .  loratadine (CLARITIN) 10 MG tablet, Take 10 mg by mouth daily., Disp: , Rfl: 1 .  pantoprazole (PROTONIX) 40 MG tablet, Take 1 tablet (40 mg total) by mouth daily., Disp: 90 tablet, Rfl: 3  Allergies Aspirin; Decadron [dexamethasone]; Prednisone; and Sulfa antibiotics  Review of Systems Review of Systems - Oncology ROS negative other than intermittent facial numbness that intensifies with stress.     Physical Exam  Vitals Wt Readings from Last 3 Encounters:  12/07/18 217 lb 11.2 oz (98.7 kg)  08/21/18 204 lb 6.4 oz (92.7 kg)  08/12/18 204 lb (92.5 kg)   Temp Readings from Last 3 Encounters:  12/07/18 98.1 F (36.7 C) (Oral)  09/04/18 98.5 F (36.9 C) (Oral)  08/21/18 97.9 F (36.6 C)   BP Readings from Last 3 Encounters:  12/07/18 130/74  09/04/18 122/69  08/21/18 (!) 148/88   Pulse Readings from Last 3 Encounters:  12/07/18 74  09/04/18 70  08/21/18 72   Constitutional: Well-developed, well-nourished, and in no distress.   HENT: Head: Normocephalic and atraumatic.  Mouth/Throat: No oropharyngeal exudate. Mucosa moist. Eyes: Pupils are equal, round, and reactive to light.  Conjunctivae are normal. No scleral icterus.  Neck: Normal range of motion. Neck supple. No JVD present.  Cardiovascular: Normal rate, regular rhythm and normal heart sounds.  Exam reveals no gallop and no friction rub.   No murmur heard. Pulmonary/Chest: Effort normal and breath sounds normal. No respiratory distress. No wheezes.No rales.  Abdominal: Soft. Bowel sounds are normal. No distension. There is no tenderness. There is no guarding.  Musculoskeletal: No edema or tenderness.  Lymphadenopathy: No cervical, axillary or supraclavicular adenopathy.  Neurological: Alert and oriented to person, place, and time. No cranial nerve deficit.  Skin: Skin is warm and dry. No rash noted. No erythema. No pallor.  Psychiatric: Affect and judgment normal.  Breast exam:  Chaperone present.  No palpable dominant masses appreciated bilaterally.    Labs No visits with results within 3 Day(s) from this visit.  Latest known visit with results is:  Hospital Outpatient Visit on 10/16/2018  Component Date Value Ref Range Status  . Color, Urine 10/16/2018 YELLOW  YELLOW Final  . APPearance 10/16/2018 CLEAR  CLEAR Final  . Specific Gravity, Urine 10/16/2018 1.011  1.005 - 1.030 Final  . pH 10/16/2018 6.0  5.0 - 8.0 Final  . Glucose, UA 10/16/2018 NEGATIVE  NEGATIVE mg/dL Final  . Hgb urine dipstick 10/16/2018 NEGATIVE  NEGATIVE Final  . Bilirubin Urine 10/16/2018 NEGATIVE  NEGATIVE Final  . Ketones, ur 10/16/2018 NEGATIVE  NEGATIVE mg/dL Final  . Protein, ur 10/16/2018 NEGATIVE  NEGATIVE mg/dL Final  . Nitrite 10/16/2018 NEGATIVE  NEGATIVE Final  . Leukocytes, UA 10/16/2018 NEGATIVE  NEGATIVE Final  . RBC / HPF 10/16/2018 0-5  0 - 5 RBC/hpf Final  . WBC, UA 10/16/2018 0-5  0 - 5 WBC/hpf Final  . Bacteria, UA 10/16/2018 NONE SEEN  NONE SEEN Final  . Squamous Epithelial / LPF 10/16/2018 0-5  0 - 5 Final  . Mucus 10/16/2018 PRESENT   Final   Performed at [facility], [address] [place], [place]  [address]     Pathology No orders of the defined types were placed in this encounter.      Vetta Higgs MD 

## 2018-12-07 NOTE — Patient Instructions (Signed)
[facility]  Discharge Instructions: You saw Dr. Higgs today                               _______________________________________________________________  Thank you for choosing [facility] to provide your oncology and hematology care.  To afford each patient quality time with our providers, please arrive at least 15 minutes before your scheduled appointment.  You need to re-schedule your appointment if you arrive 10 or more minutes late.  We strive to give you quality time with our providers, and arriving late affects you and other patients whose appointments are after yours.  Also, if you no show three or more times for appointments you may be dismissed from the clinic.  Again, thank you for choosing [facility]. Our hope is that these requests will allow you access to exceptional care and in a timely manner. _______________________________________________________________  If you have questions after your visit, please contact our office at [phone] between the hours of 8:30 a.m. and 5:00 p.m. Voicemails left after 4:30 p.m. will not be returned until the following business day. _______________________________________________________________  For prescription refill requests, have your pharmacy contact our office. _______________________________________________________________  Recommendations made by the consultant and any test results will be sent to your referring physician. _______________________________________________________________ 

## 2018-12-18 ENCOUNTER — Other Ambulatory Visit: Payer: Self-pay | Admitting: Pediatrics

## 2019-01-29 ENCOUNTER — Ambulatory Visit: Payer: BLUE CROSS/BLUE SHIELD | Admitting: Pediatrics

## 2019-02-19 ENCOUNTER — Encounter: Payer: BLUE CROSS/BLUE SHIELD | Admitting: Pediatrics

## 2019-04-21 IMAGING — MG 2D DIGITAL DIAGNOSTIC UNILATERAL LEFT MAMMOGRAM WITH CAD AND ADJ
6 of 9 series · 6 of 21 positions shown · non-contrast
Comparison: 01/22/2017 and earlier

CLINICAL DATA: Status post left lumpectomy and chemotherapy.

EXAM:
2D DIGITAL DIAGNOSTIC UNILATERAL LEFT MAMMOGRAM WITH CAD AND ADJUNCT
TOMO

[L CC (1 of 2)]
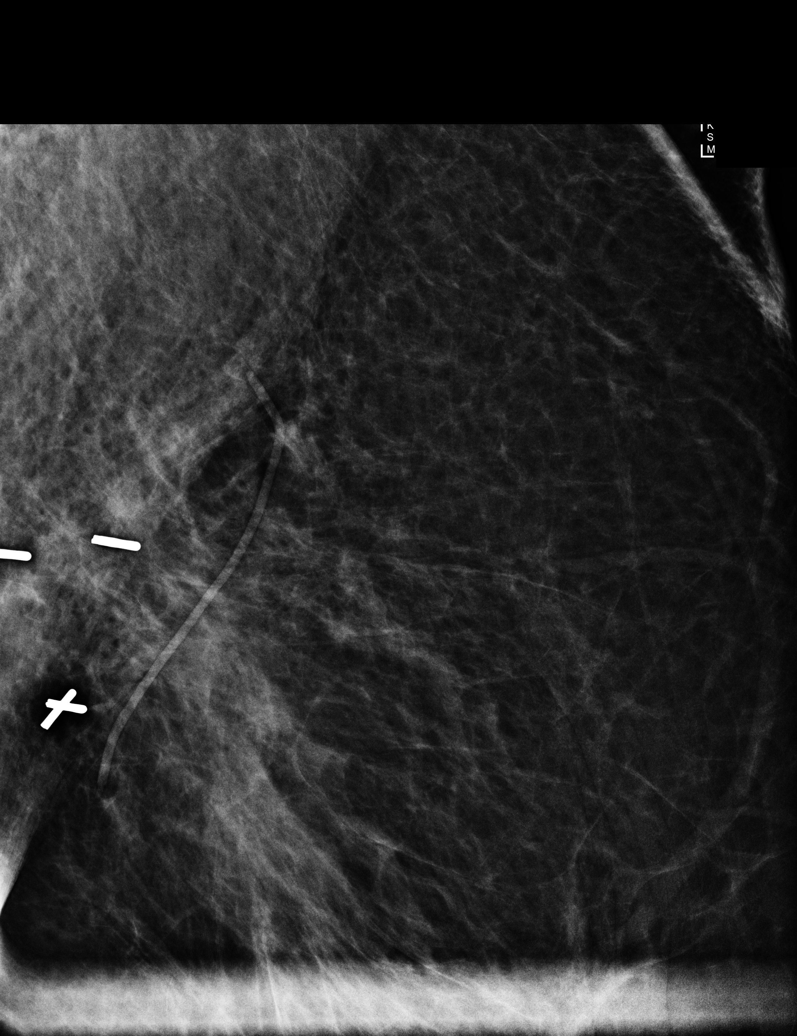

[L XCCL]
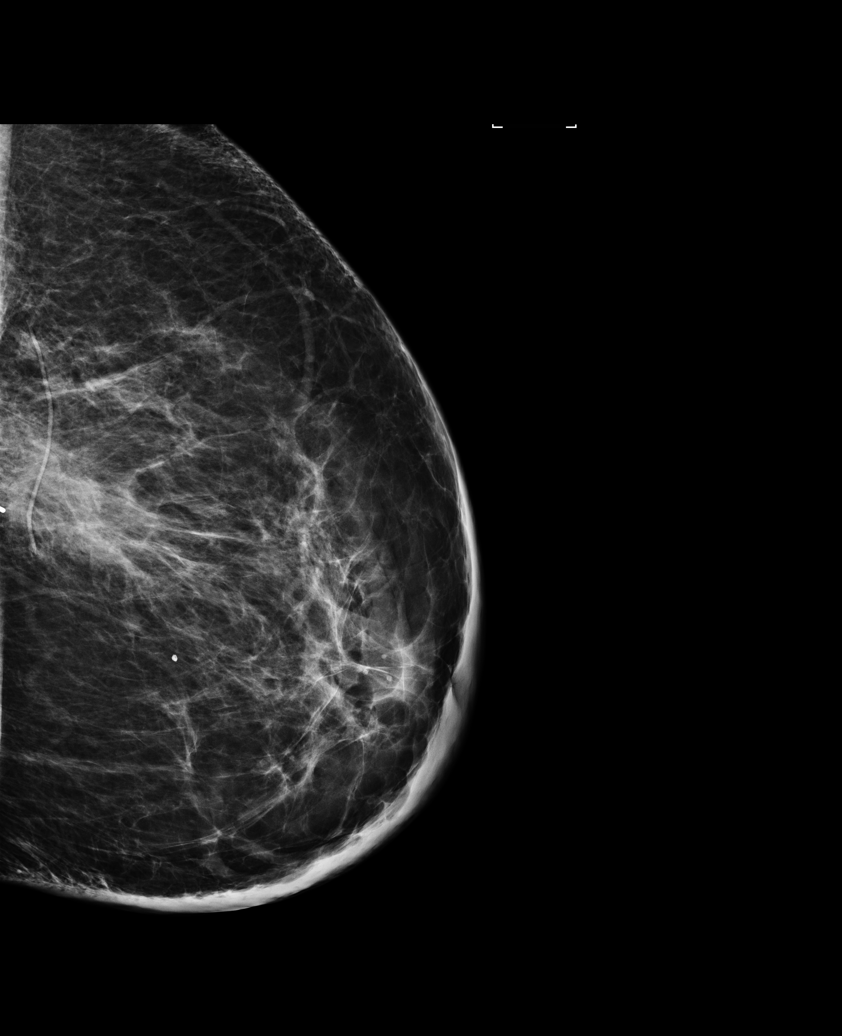

[L CC (2 of 2)]
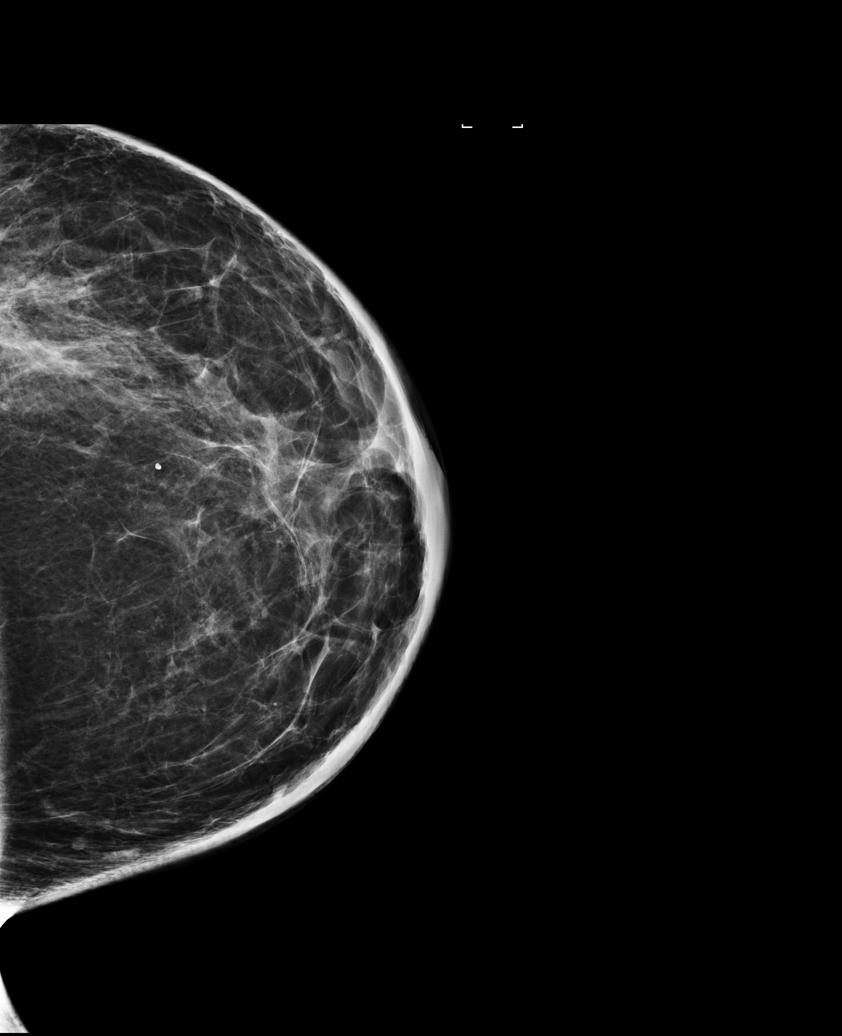

[L CC synth-2D]
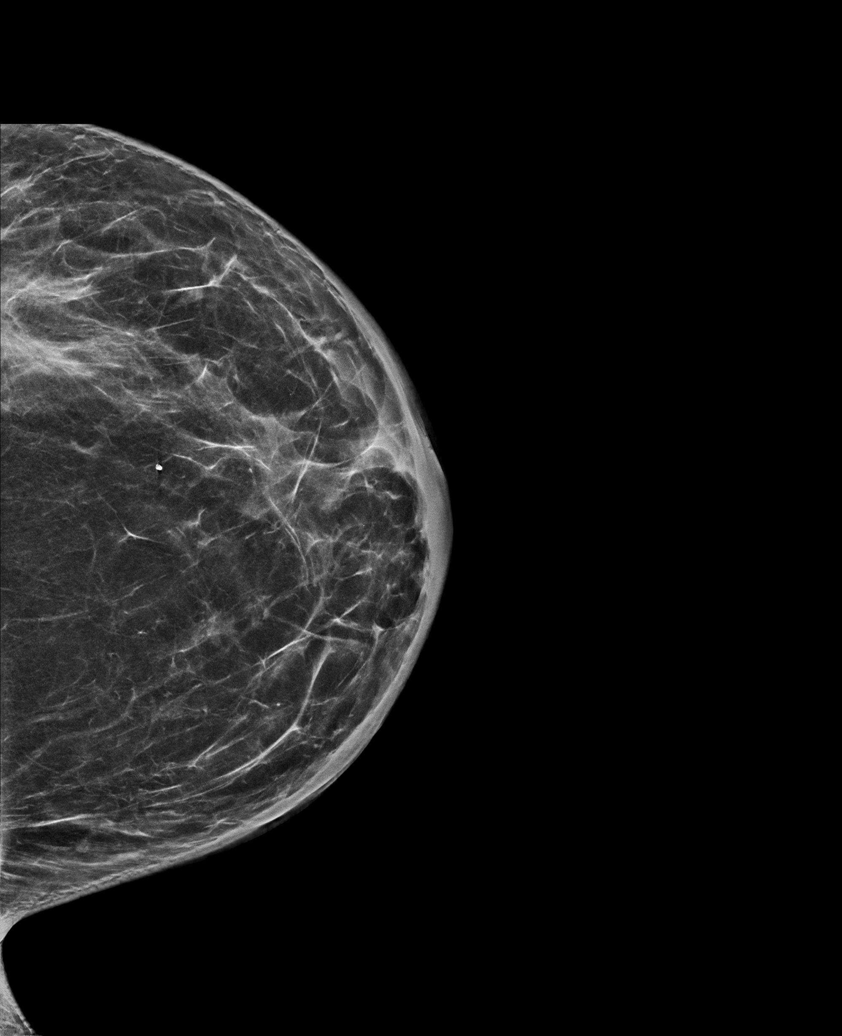

[L MLO]
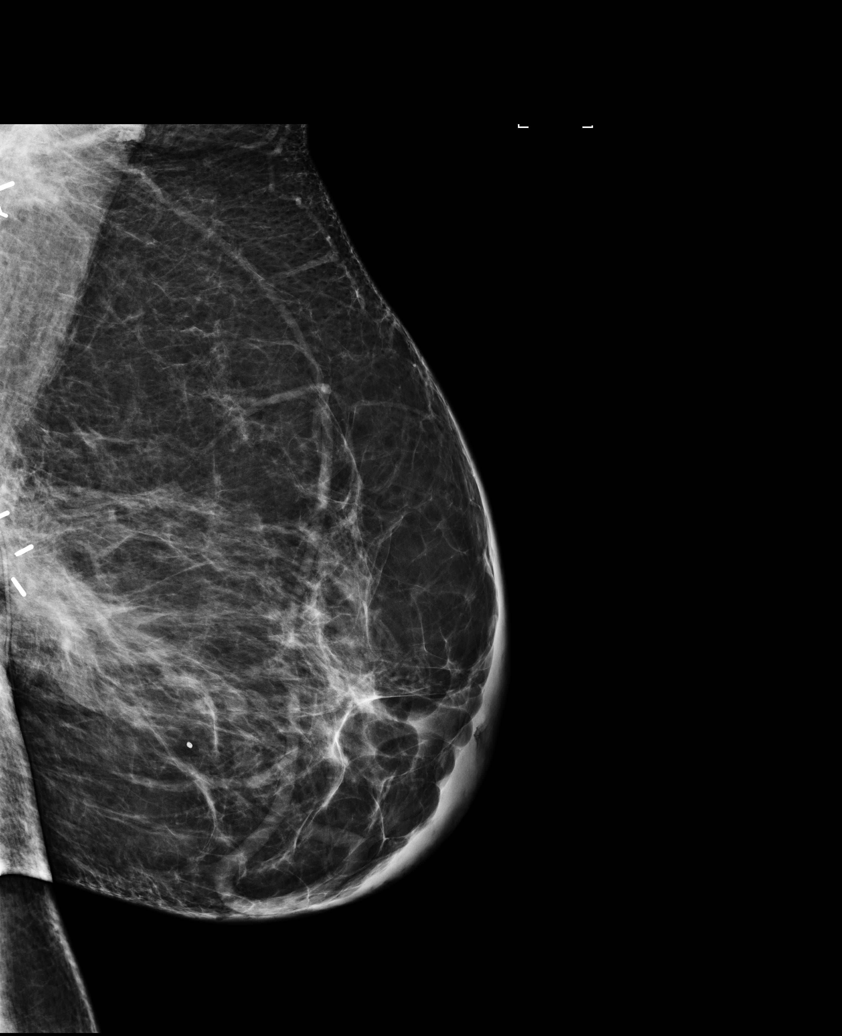

[L MLO synth-2D]
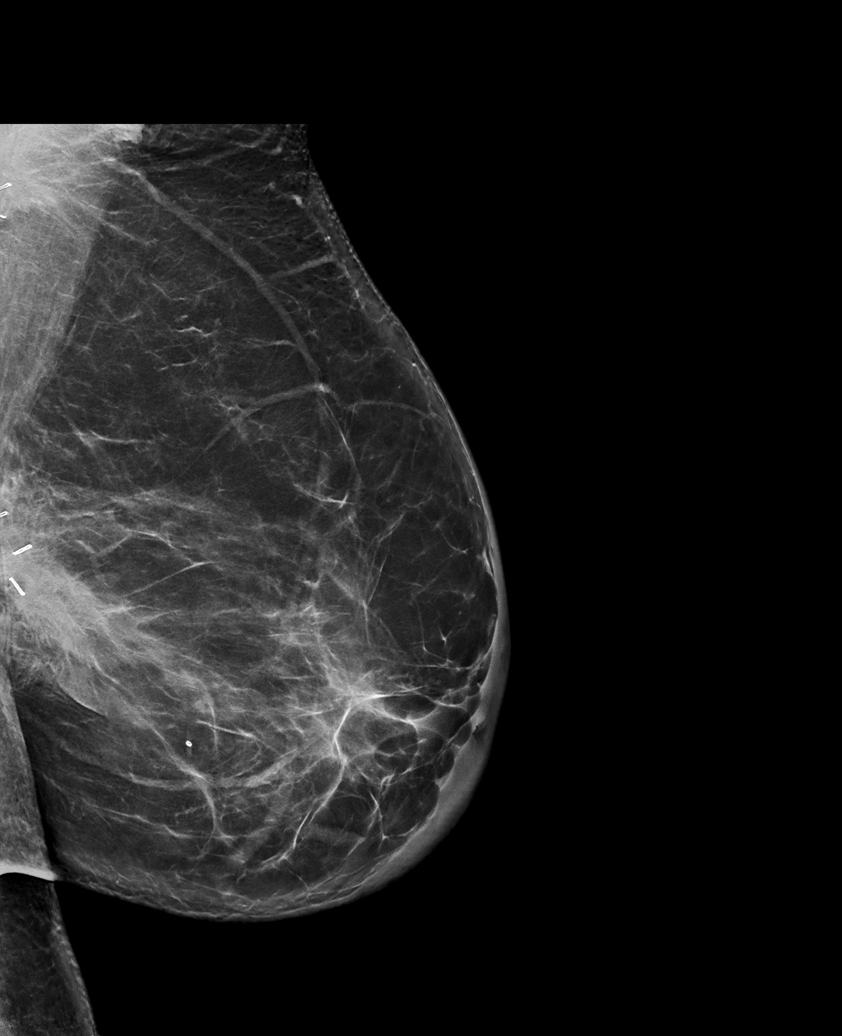

[6 of 21 positions shown; findings below may reference images not displayed]

ACR Breast Density Category b: There are scattered areas of
fibroglandular density.
FINDINGS: Post operative changes are seen in the leftbreast. No suspicious
mass, distortion, or microcalcifications are identified to suggest
presence of malignancy.

Mammographic images were processed with CAD.
IMPRESSION: No mammographic evidence for malignancy.

RECOMMENDATION:
Bilateral diagnostic mammogram is recommended in November 2017.

I have discussed the findings and recommendations with the patient.
Results were also provided in writing at the conclusion of the
visit. If applicable, a reminder letter will be sent to the patient
regarding the next appointment.

BI-RADS CATEGORY  2: Benign.

## 2019-06-15 ENCOUNTER — Other Ambulatory Visit: Payer: Self-pay | Admitting: *Deleted

## 2019-06-15 MED ORDER — LORATADINE 10 MG PO TABS: 10.0000 mg | ORAL_TABLET | Freq: Every day | ORAL | 3 refills | Status: AC

## 2019-08-19 ENCOUNTER — Encounter (INDEPENDENT_AMBULATORY_CARE_PROVIDER_SITE_OTHER): Payer: Self-pay

## 2019-08-23 NOTE — Progress Notes (Deleted)
   Subjective:    Patient ID: Shannon Logan, female    DOB: 1966/07/03, 53 y.o.   MRN: [id number]  HPI Shannon Logan is a 53 year old female with a past medical history of breast cancer diagnosed 11/2016 s/p left breast lumpectomy, chemotherapy and  Radiation and GERD.  EGD 10/26/2015: ED findings: Erosive reflux esophagitis. Small sliding hiatal hernia. No evidence of gastritis or peptic ulcer disease.  Colonoscopy 10/26/2015:  Colonoscopy findings: Normal colonoscopy except external hemorrhoids and anal papillae.  ? Recall   Past Medical History:  Diagnosis Date  . Breast cancer of lower-outer quadrant of left female breast ([place]) 01/03/2017  . Cancer ([place]) 11/22/2016   Breast   . Family history of breast cancer   . GERD (gastroesophageal reflux disease)   . History of radiation therapy 07/30/17- 09/10/17   Left Breast 50 gy in 25 fractions, Left Breast boost 10 Gy in 5 fractions.   . Personal history of chemotherapy    from 3/18 till 7/18  . Personal history of radiation therapy      Past Surgical History:  Procedure Laterality Date  . BREAST LUMPECTOMY Left 01/22/17 and 01/30/17  . BREAST LUMPECTOMY WITH AXILLARY LYMPH NODE DISSECTION Left 01/30/2017   Procedure: RE-EXCISION OF LEFT BREAST LUMPECTOMY AND LEFT AXILLARY LYMPH NODE DISSECTION;  Surgeon: Coralie Keens, MD;  Location: [facility];  Service: General;  Laterality: Left;  . BREAST LUMPECTOMY WITH RADIOACTIVE SEED AND SENTINEL LYMPH NODE BIOPSY Left 01/22/2017   Procedure: BREAST LUMPECTOMY WITH RADIOACTIVE SEED AND SENTINEL LYMPH NODE BIOPSY;  Surgeon: Coralie Keens, MD;  Location: [facility];  Service: General;  Laterality: Left;  . COLONOSCOPY N/A 10/26/2015   Procedure: COLONOSCOPY;  Surgeon: Rogene Houston, MD;  [facility];  Service: Endoscopy;  Laterality: N/A;  . DILATION AND CURETTAGE OF UTERUS    . ESOPHAGOGASTRODUODENOSCOPY N/A 10/26/2015   Procedure:  ESOPHAGOGASTRODUODENOSCOPY (EGD);  Surgeon: Rogene Houston, MD;  [facility];  Service: Endoscopy;  Laterality: N/A;  1:00  . PORTACATH PLACEMENT Right 01/22/2017   Procedure: INSERTION PORT-A-CATH;  Surgeon: Coralie Keens, MD;  Location: [facility];  Service: General;  Laterality: Right;  . URETHRAL DILATION       Review of Systems     Objective:   Physical Exam        Assessment & Plan:

## 2019-08-24 ENCOUNTER — Ambulatory Visit (INDEPENDENT_AMBULATORY_CARE_PROVIDER_SITE_OTHER): Payer: BLUE CROSS/BLUE SHIELD | Admitting: Nurse Practitioner

## 2020-06-05 ENCOUNTER — Other Ambulatory Visit: Payer: Self-pay | Admitting: Family

## 2022-01-23 ENCOUNTER — Ambulatory Visit (HOSPITAL_COMMUNITY): Payer: PRIVATE HEALTH INSURANCE | Attending: Orthopaedic Surgery | Admitting: Occupational Therapy

## 2022-01-23 ENCOUNTER — Encounter (HOSPITAL_COMMUNITY): Payer: Self-pay | Admitting: Occupational Therapy

## 2022-01-23 ENCOUNTER — Other Ambulatory Visit: Payer: Self-pay

## 2022-01-23 DIAGNOSIS — R278 Other lack of coordination: Secondary | ICD-10-CM | POA: Diagnosis present

## 2022-01-23 DIAGNOSIS — M25531 Pain in right wrist: Secondary | ICD-10-CM | POA: Diagnosis present

## 2022-01-23 DIAGNOSIS — M25611 Stiffness of right shoulder, not elsewhere classified: Secondary | ICD-10-CM | POA: Diagnosis present

## 2022-01-23 DIAGNOSIS — M25631 Stiffness of right wrist, not elsewhere classified: Secondary | ICD-10-CM

## 2022-01-23 DIAGNOSIS — M25511 Pain in right shoulder: Secondary | ICD-10-CM | POA: Diagnosis not present

## 2022-01-23 DIAGNOSIS — R29898 Other symptoms and signs involving the musculoskeletal system: Secondary | ICD-10-CM

## 2022-01-23 NOTE — Patient Instructions (Signed)
1) SHOULDER: Flexion On Table   Place hands on towel placed on table, elbows straight. Lean forward with you upper body, pushing towel away from body.  _10__ reps per set, _2-3__ sets per day  2) Abduction (Passive)   With arm out to side, resting on towel placed on table with palm DOWN, keeping trunk away from table, lean to the side while pushing towel away from body.  Repeat __10__ times. Do __2-3__ sessions per day.  Copyright  VHI. All rights reserved.     3) Internal Rotation (Assistive)   Seated with elbow bent at right angle and held against side, slide arm on table surface in an inward arc keeping elbow anchored in place. Repeat _10___ times. Do ___2-3_ sessions per day. Activity: Use this motion to brush crumbs off the table.  Copyright  VHI. All rights reserved.       Complete each exercise 10-15X, 2-3X/day  1) Towel crunch Place a small towel on a firm table top. Flatten out the towel and then place your hand on one end of it.  Next, flex your fingers 2-5 (index finger through pinky finger) as you pull the towel towards your hand.    2) Digit composite flexion/adduction (make a fist) Hold your hand up as shown. Open and close your hand into a fist and repeat. If you cannot make a full fist, then make a partial fist.    3) Thumb/finger opposition Touch the tip of the thumb to each fingertip one by one. Extend fingers fully after they are touched.      4) Finger Taps Start with the hand flat and fingers slightly spread.  One at a time, starting with the thumb, lift each finger up separately.      6) Digit Abduction/Adduction Hold hand palm down flat on table. Spread your fingers apart and back together.

## 2022-01-23 NOTE — Therapy (Signed)
[facility] [place], [place], [address] Phone: [phone]   Fax:  [phone]  Occupational Therapy Evaluation  Patient Details  Name: Shannon Logan MRN: [id number] Date of Birth: November 07, 1966 Referring Provider (OT): Dr. Berenice Primas   Encounter Date: 01/23/2022   OT End of Session - 01/23/22 1219     Visit Number 1    Number of Visits 16    Date for OT Re-Evaluation 03/24/22   Mini-reassessment 02/22/2022   Authorization Type [employer], $4.00 copay    Authorization Time Period 1st 12 visits pre-approved, need approval for additional visits    Authorization - Visit Number 1    Authorization - Number of Visits 12    OT Start Time 1120    OT Stop Time 1200    OT Time Calculation (min) 40 min    Activity Tolerance Patient tolerated treatment well    Behavior During Therapy [facility] for tasks assessed/performed             Past Medical History:  Diagnosis Date   Breast cancer of lower-outer quadrant of left female breast ([place]) 01/03/2017   Cancer ([place]) 11/22/2016   Breast    Family history of breast cancer    GERD (gastroesophageal reflux disease)    History of radiation therapy 07/30/17- 09/10/17   Left Breast 50 gy in 25 fractions, Left Breast boost 10 Gy in 5 fractions.    Personal history of chemotherapy    from 3/18 till 7/18   Personal history of radiation therapy     Past Surgical History:  Procedure Laterality Date   BREAST LUMPECTOMY Left 01/22/17 and 01/30/17   BREAST LUMPECTOMY WITH AXILLARY LYMPH NODE DISSECTION Left 01/30/2017   Procedure: RE-EXCISION OF LEFT BREAST LUMPECTOMY AND LEFT AXILLARY LYMPH NODE DISSECTION;  Surgeon: Coralie Keens, MD;  Location: [facility];  Service: General;  Laterality: Left;   BREAST LUMPECTOMY WITH RADIOACTIVE SEED AND SENTINEL LYMPH NODE BIOPSY Left 01/22/2017   Procedure: BREAST LUMPECTOMY WITH RADIOACTIVE SEED AND SENTINEL LYMPH NODE BIOPSY;  Surgeon:  Coralie Keens, MD;  Location: [facility];  Service: General;  Laterality: Left;   COLONOSCOPY N/A 10/26/2015   Procedure: COLONOSCOPY;  Surgeon: Rogene Houston, MD;  [facility];  Service: Endoscopy;  Laterality: N/A;   DILATION AND CURETTAGE OF UTERUS     ESOPHAGOGASTRODUODENOSCOPY N/A 10/26/2015   Procedure: ESOPHAGOGASTRODUODENOSCOPY (EGD);  Surgeon: Rogene Houston, MD;  [facility];  Service: Endoscopy;  Laterality: N/A;  1:00   PORTACATH PLACEMENT Right 01/22/2017   Procedure: INSERTION PORT-A-CATH;  Surgeon: Coralie Keens, MD;  Location: [facility];  Service: General;  Laterality: Right;   URETHRAL DILATION      There were no vitals filed for this visit.   Subjective Assessment - 01/23/22 1215     Subjective  S: My shoulder bothers me more than my hand.    Pertinent History Pt is a 56 y/o female s/p ORIF of right distal radius/brachioradialis tenotomy on 12/05/21 secondary to fall on 11/24/21. Pt began having right shoulder pain after the nerve block used for the surgery. Pt was referred to occupational therapy for evaluation and treatment by Dr. Berenice Primas.    Special Tests Complete DASH next session    Patient Stated Goals To have less pain and be able to use my arm and hand more.    Currently in Pain? No/denies  [facility] OT Assessment - 01/23/22 1122       Assessment   Medical Diagnosis ORIF of right intraarticular distal radius fracture; Right brachioradialis tendon open tenotomy; right shoulder pain    Referring Provider (OT) Dr. Berenice Primas    Onset Date/Surgical Date 12/05/21   sx date   Hand Dominance Right    Next MD Visit 02/28/22    Prior Therapy None      Precautions   Precautions Other (comment)    Precaution Comments no lifting more than 10#      Restrictions   Weight Bearing Restrictions Yes    RUE Weight Bearing Weight bearing as tolerated      Balance Screen   Has the  patient fallen in the past 6 months Yes    How many times? 2    Has the patient had a decrease in activity level because of a fear of falling?  No    Is the patient reluctant to leave their home because of a fear of falling?  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 Pt is using right hand during daily tasks, tasks are slow but she is incorporating the right hand in daily use. Has difficulty with gripping and lifting/carrying objects      Written Expression   Dominant Hand Right      Cognition   Overall Cognitive Status Within Functional Limits for tasks assessed      Observation/Other Assessments   Other Surveys  Select    Quick DASH  Complete next session      Edema   Edema MCPs: right 20.25cm, left 19cm; palm: right 21cm, left 20.5cm; Wrist: right 19cm, left 16.5cm      ROM / Strength   AROM / PROM / Strength AROM;PROM;Strength      Palpation   Palpation comment Mod fascial restrictions along anterior shoulder and trapezius regions      AROM   Overall AROM Comments Assessed seated, er/IR adducted    AROM Assessment Site Shoulder;Forearm;Wrist    Right/Left Shoulder Right    Right Shoulder Flexion 105 Degrees    Right Shoulder ABduction 78 Degrees    Right Shoulder Internal Rotation 90 Degrees    Right Shoulder External Rotation 30 Degrees    Right/Left Forearm Right    Right Forearm Pronation 55 Degrees    Right Forearm Supination 28 Degrees    Right/Left Wrist Right    Right Wrist Extension 18 Degrees    Right Wrist Flexion 20 Degrees    Right Wrist Radial Deviation 12 Degrees    Right Wrist Ulnar Deviation 8 Degrees      PROM   Overall PROM Comments Assessed supine, er/IR adducted    PROM  Assessment Site Shoulder;Forearm;Wrist    Right/Left Shoulder Right    Right Shoulder Flexion 132 Degrees    Right Shoulder ABduction 125 Degrees    Right Shoulder Internal Rotation 90 Degrees    Right Shoulder External Rotation 37 Degrees    Right/Left Forearm Right    Right Forearm Pronation 70 Degrees    Right Forearm Supination 35 Degrees    Right/Left Wrist Right    Right Wrist Extension  25 Degrees    Right Wrist Flexion 30 Degrees    Right Wrist Radial Deviation 14 Degrees    Right Wrist Ulnar Deviation 10 Degrees      Strength   Overall Strength Comments Assessed seated, er/IR adducted    Strength Assessment Site Hand;Shoulder    Right/Left Shoulder Right    Right Shoulder Flexion 3-/5    Right Shoulder ABduction 3-/5    Right Shoulder Internal Rotation 3/5    Right Shoulder External Rotation 3-/5    Right/Left hand Right;Left    Right Hand Gross Grasp Impaired    Right Hand Grip (lbs) 7    Right Hand Lateral Pinch 7 lbs    Right Hand 3 Point Pinch 5 lbs    Left Hand Gross Grasp Functional    Left Hand Grip (lbs) 68    Left Hand Lateral Pinch 20 lbs    Left Hand 3 Point Pinch 19 lbs                              OT Education - 01/23/22 1219     Education Details table slides for shoulder, finger A/ROM for hand    Person(s) Educated Patient    Methods Explanation;Demonstration;Handout    Comprehension Verbalized understanding;Returned demonstration              OT Short Term Goals - 01/23/22 1225       OT SHORT TERM GOAL #1   Title Pt will be provided with and educated on HEP to improve mobility required for ADL and work task completion.    Time 4    Period Weeks    Status New    Target Date 02/22/22      OT SHORT TERM GOAL #2   Title Pt will increase right shoulder and wrist P/ROM to [facility] to improve ability to perform dressing tasks with minimal compensatory strategies.    Time 4    Period Weeks    Status New      OT SHORT TERM  GOAL #3   Title Pt will increase right shoulder strength to 3+/5 or greater to improve ability to reach for items above shoulder level during ADLs.    Time 4    Period Weeks    Status New      OT SHORT TERM GOAL #4   Title Pt will decrease edema in RUE to trace amounts to improve mobility required for making a fist.    Time 4    Period Weeks    Status New      OT SHORT TERM GOAL #5   Title Pt will increase right grip strength by 20# and pinch strength by 3# to improve ability to hold dishes or drink from a full thermos/water bottle.    Time 4    Period Weeks    Status New               OT Long Term Goals - 01/23/22 1229       OT LONG TERM GOAL #1   Title Pt will return to highest level of functioning using her RUE as dominant during ADL tasks.    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    Time 8    Period Weeks    Status New      OT LONG TERM GOAL #3   Title Pt will increase right shoulder, forearm, and wrist strength to 4+/5 or greater to improve ability to grasp and lift items to various heights and positions.    Time 8    Period Weeks    Status New      OT LONG TERM GOAL #4   Title Pt will decrease fascial restrictions in RUE to min amounts or less to improve mobility required for ADL and work task completion.    Time 8    Period Weeks    Status New      OT LONG TERM GOAL #5   Title Pt will increase right grip strength by 40# and pinch strength by 6# to improve ability to grasp and manipulate tools and utensils at home and work.    Time 8    Period Weeks    Status New                   Plan - 01/23/22 1221     Clinical Impression Statement A: Pt is a 56 y/o female s/p right distal radius ORIF and brachioradialis tenotomy on 12/05/21 as well as right shoulder  pain that began immediately after the surgery, potentially secondary to the nerve block. Pt using prefabricated splint as needed, MD has instructed no lifting over 10#. Pt with notable edema in fingers, hand, and wrist, provided small sized edema glove today.    OT Occupational Profile and History Problem Focused Assessment - Including review of records relating to presenting problem    Occupational performance deficits (Please refer to evaluation for details): ADL's;IADL's;Rest and Sleep;Work;Leisure    Body Structure / Function / Physical Skills ADL;Endurance;UE functional use;Fascial restriction;Pain;ROM;IADL;Strength;Edema;Coordination;Scar mobility    Rehab Potential Good    Clinical Decision Making Limited treatment options, no task modification necessary    Comorbidities Affecting Occupational Performance: None    Modification or Assistance to Complete Evaluation  No modification of tasks or assist necessary to complete eval    OT Frequency 2x / week    OT Duration 8 weeks    OT Treatment/Interventions Self-care/ADL training;Ultrasound;DME and/or AE instruction;Patient/family education;Passive range of motion;Cryotherapy;Electrical Stimulation;Splinting;Moist Heat;Therapeutic exercise;Manual Therapy;Therapeutic activities    Plan P: Pt will benefi from skilled OT services to decrease edema, pain, and fascial restrictions, increase ROM, strength, and functional use of RUE. Treatment plan: myofascial release and manual techniques, edema management, P/ROM, AA/ROM, A/ROM, general RUE strengthening, grip and pinch strengthening, fine motor coordination tasks, modalities prn. NEXT SESSION: DASH    OT Home Exercise Plan eval: table slides, finger A/ROM, edema management    Consulted and Agree with Plan of Care Patient             Patient will benefit from skilled therapeutic intervention in order to improve the following deficits and impairments:   Body Structure / Function / Physical Skills:  ADL, Endurance, UE functional use, Fascial restriction, Pain, ROM, IADL, Strength, Edema, Coordination, Scar mobility       Visit Diagnosis: Acute pain of right shoulder  Other symptoms and signs involving the musculoskeletal system  Stiffness of right shoulder, not elsewhere classified  Pain in right wrist  Stiffness of right wrist, not elsewhere classified  Other lack of coordination    Problem List Patient Active Problem List   Diagnosis Date  Noted   History of kidney stones 08/13/2018   History of gross hematuria 08/13/2018   Epistaxis, recurrent 08/22/2017   Gastroesophageal reflux disease 08/22/2017   Pharyngeal dysphagia 08/22/2017   Genetic testing 01/20/2017   Breast cancer of lower-outer quadrant of left female breast ([place]) 01/03/2017   Family history of breast cancer    Prediabetes 10/10/2016   Hypertriglyceridemia 04/05/2016   BMI 32.0-32.9,adult 09/22/2015   Abdominal pain, right upper quadrant 07/11/2015    Guadelupe Sabin, OTR/L  [phone] 01/23/2022, 1:40 PM  [facility] [place], [place], [address] Phone: [phone]   Fax:  [phone]  Name: Shannon Logan MRN: [id number] Date of Birth: 1966/04/23

## 2022-02-05 ENCOUNTER — Encounter (HOSPITAL_COMMUNITY): Payer: Self-pay | Admitting: Occupational Therapy

## 2022-02-05 ENCOUNTER — Ambulatory Visit (HOSPITAL_COMMUNITY): Payer: PRIVATE HEALTH INSURANCE | Admitting: Occupational Therapy

## 2022-02-05 ENCOUNTER — Other Ambulatory Visit: Payer: Self-pay

## 2022-02-05 DIAGNOSIS — M25511 Pain in right shoulder: Secondary | ICD-10-CM

## 2022-02-05 DIAGNOSIS — M25531 Pain in right wrist: Secondary | ICD-10-CM

## 2022-02-05 DIAGNOSIS — R278 Other lack of coordination: Secondary | ICD-10-CM

## 2022-02-05 DIAGNOSIS — M25631 Stiffness of right wrist, not elsewhere classified: Secondary | ICD-10-CM

## 2022-02-05 DIAGNOSIS — R29898 Other symptoms and signs involving the musculoskeletal system: Secondary | ICD-10-CM

## 2022-02-05 DIAGNOSIS — M25611 Stiffness of right shoulder, not elsewhere classified: Secondary | ICD-10-CM

## 2022-02-05 NOTE — Patient Instructions (Addendum)
Repeat all exercises 10-15 times, 1-2 times per day.  1) Shoulder Protraction    Begin with elbows by your side, slowly "punch" straight out in front of you.      2) Shoulder Flexion  Supine:     Standing:         Begin with arms at your side with thumbs pointed up, slowly raise both arms up and forward towards overhead.               3) Horizontal abduction/adduction  Supine:   Standing:           Begin with arms straight out in front of you, bring out to the side in at "T" shape. Keep arms straight entire time.                 4) Internal & External Rotation   Supine:     Standing:     Stand with elbows at the side and elbows bent 90 degrees. Move your forearms away from your body, then bring back inward toward the body.     5) Shoulder Abduction  Supine:     Standing:       Lying on your back begin with your arms flat on the table next to your side. Slowly move your arms out to the side so that they go overhead, in a jumping jack or snow angel movement.      Wrist AROM Exercises  Complete exercises __10-15____ times each, ___2____ times per day  1) Wrist Flexion  Start with wrist at edge of table, palm facing up. With wrist hanging slightly off table, curl wrist upward, and back down.      2) Wrist Extension  Start with wrist at edge of table, palm facing down. With wrist slightly off the edge of the table, curl wrist up and back down.      3) Radial Deviations  Start with forearm flat against a table, wrist hanging slightly off the edge, and palm facing the wall. Bending at the wrist only, and keeping palm facing the wall, bend wrist so fist is pointing towards the floor, back up to start position, and up towards the ceiling. Return to start.        4) WRIST PRONATION  Turn your forearm towards palm face down.  Keep your elbow bent and by the side of your  Body.      5) WRIST SUPINATION  Turn your  forearm towards palm face up.  Keep your elbow bent and by the side of your  Body.

## 2022-02-05 NOTE — Therapy (Signed)
[place], [place], [address] Phone: [phone]   Fax:  [phone]  Occupational Therapy Treatment  Patient Details  Name: Shannon Logan MRN: [id number] Date of Birth: 1966/09/10 Referring Provider (OT): Dr. Berenice Primas   Encounter Date: 02/05/2022   OT End of Session - 02/05/22 1028     Visit Number 2    Number of Visits 16    Date for OT Re-Evaluation 03/24/22   Mini-reassessment 02/22/2022   Authorization Type [employer], $4.00 copay    Authorization Time Period 1st 12 visits pre-approved, need approval for additional visits    Authorization - Visit Number 2    Authorization - Number of Visits 12    OT Start Time [phone]    OT Stop Time 1029    OT Time Calculation (min) 46 min    Activity Tolerance Patient tolerated treatment well    Behavior During Therapy [facility] for tasks assessed/performed             Past Medical History:  Diagnosis Date   Breast cancer of lower-outer quadrant of left female breast ([place]) 01/03/2017   Cancer ([place]) 11/22/2016   Breast    Family history of breast cancer    GERD (gastroesophageal reflux disease)    History of radiation therapy 07/30/17- 09/10/17   Left Breast 50 gy in 25 fractions, Left Breast boost 10 Gy in 5 fractions.    Personal history of chemotherapy    from 3/18 till 7/18   Personal history of radiation therapy     Past Surgical History:  Procedure Laterality Date   BREAST LUMPECTOMY Left 01/22/17 and 01/30/17   BREAST LUMPECTOMY WITH AXILLARY LYMPH NODE DISSECTION Left 01/30/2017   Procedure: RE-EXCISION OF LEFT BREAST LUMPECTOMY AND LEFT AXILLARY LYMPH NODE DISSECTION;  Surgeon: Coralie Keens, MD;  Location: [place];  Service: General;  Laterality: Left;   BREAST LUMPECTOMY WITH RADIOACTIVE SEED AND SENTINEL LYMPH NODE BIOPSY Left 01/22/2017   Procedure: BREAST LUMPECTOMY WITH RADIOACTIVE SEED AND SENTINEL LYMPH NODE BIOPSY;  Surgeon:  Coralie Keens, MD;  Location: [place];  Service: General;  Laterality: Left;   COLONOSCOPY N/A 10/26/2015   Procedure: COLONOSCOPY;  Surgeon: Rogene Houston, MD;  [facility];  Service: Endoscopy;  Laterality: N/A;   DILATION AND CURETTAGE OF UTERUS     ESOPHAGOGASTRODUODENOSCOPY N/A 10/26/2015   Procedure: ESOPHAGOGASTRODUODENOSCOPY (EGD);  Surgeon: Rogene Houston, MD;  [facility];  Service: Endoscopy;  Laterality: N/A;  1:00   PORTACATH PLACEMENT Right 01/22/2017   Procedure: INSERTION PORT-A-CATH;  Surgeon: Coralie Keens, MD;  Location: [place];  Service: General;  Laterality: Right;   URETHRAL DILATION      There were no vitals filed for this visit.   Subjective Assessment - 02/05/22 0942     Subjective  S: The swelling is a lot better.    Special Tests DASH: 38.64    Currently in Pain? No/denies                [facility] OT Assessment - 02/05/22 0941       Assessment   Medical Diagnosis ORIF of right intraarticular distal radius fracture; Right brachioradialis tendon open tenotomy; right shoulder pain      Precautions   Precautions Other (comment)    Precaution Comments no lifting more than 10#      Observation/Other Assessments   Quick DASH  38.64  Katina Dung - 02/05/22 1025     Open a tight or new jar Unable    Do heavy household chores (wash walls, wash floors) Mild difficulty    Carry a shopping bag or briefcase No difficulty    Wash your back Unable    Use a knife to cut food Moderate difficulty    Recreational activities in which you take some force or impact through your arm, shoulder, or hand (golf, hammering, tennis) Unable    During the past week, to what extent has your arm, shoulder or hand problem interfered with your normal social activities with family, friends, neighbors, or groups? Not at all    During the past week, to what extent has your arm, shoulder or hand  problem limited your work or other regular daily activities Slightly    Arm, shoulder, or hand pain. Mild    Tingling (pins and needles) in your arm, shoulder, or hand None    Difficulty Sleeping No difficulty    DASH Score 38.64 %                  OT Treatments/Exercises (OP) - 02/05/22 0945       Exercises   Exercises Shoulder;Hand;Wrist      Shoulder Exercises: Supine   Protraction PROM;5 reps;AROM;10 reps    Horizontal ABduction PROM;5 reps;AROM;10 reps    External Rotation PROM;5 reps;AROM;10 reps    Internal Rotation PROM;5 reps;AROM;10 reps    Flexion PROM;5 reps;AROM;10 reps    ABduction PROM;5 reps;AROM;10 reps      Weighted Stretch Over Towel Roll   Wrist Flexion - Weighted Stretch 2 pounds;60 seconds   2 reps   Wrist Extension - Weighted Stretch 2 pounds;60 seconds   2 reps     Wrist Exercises   Wrist Flexion PROM;5 reps;AROM;10 reps    Wrist Extension PROM;5 reps;AROM;10 reps    Wrist Radial Deviation PROM;5 reps;AROM;10 reps    Wrist Ulnar Deviation PROM;5 reps;AROM;10 reps    Other wrist exercises forearm supination/pronation, P/ROM, 5X; A/ROM 10X      Additional Wrist Exercises   Sponges 12, 15      Manual Therapy   Manual Therapy Myofascial release    Manual therapy comments completed separately from therapeutic exercises    Myofascial Release myofascial release to right dorsal and volar wrist and forearm, upper arm, anterior shoulder, and trapezius regions to decrease pain and fascial restrictions and increase joint ROM.                    OT Education - 02/05/22 1014     Education Details A/ROM for shoulder and wrist    Person(s) Educated Patient    Methods Explanation;Demonstration;Handout    Comprehension Verbalized understanding;Returned demonstration              OT Short Term Goals - 02/05/22 1030       OT SHORT TERM GOAL #1   Title Pt will be provided with and educated on HEP to improve mobility required for ADL and  work task completion.    Time 4    Period Weeks    Status On-going    Target Date 02/22/22      OT SHORT TERM GOAL #2   Title Pt will increase right shoulder and wrist P/ROM to [facility] to improve ability to perform dressing tasks with minimal compensatory strategies.    Time 4    Period Weeks    Status On-going  OT SHORT TERM GOAL #3   Title Pt will increase right shoulder strength to 3+/5 or greater to improve ability to reach for items above shoulder level during ADLs.    Time 4    Period Weeks    Status On-going      OT SHORT TERM GOAL #4   Title Pt will decrease edema in RUE to trace amounts to improve mobility required for making a fist.    Time 4    Period Weeks    Status On-going      OT SHORT TERM GOAL #5   Title Pt will increase right grip strength by 20# and pinch strength by 3# to improve ability to hold dishes or drink from a full thermos/water bottle.    Time 4    Period Weeks    Status On-going               OT Long Term Goals - 02/05/22 1030       OT LONG TERM GOAL #1   Title Pt will return to highest level of functioning using her RUE as dominant during ADL tasks.    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    Time 8    Period Weeks    Status On-going      OT LONG TERM GOAL #3   Title Pt will increase right shoulder, forearm, and wrist strength to 4+/5 or greater to improve ability to grasp and lift items to various heights and positions.    Time 8    Period Weeks    Status On-going      OT LONG TERM GOAL #4   Title Pt will decrease fascial restrictions in RUE to min amounts or less to improve mobility required for ADL and work task completion.    Time 8    Period Weeks    Status On-going      OT LONG TERM GOAL #5   Title Pt will increase right  grip strength by 40# and pinch strength by 6# to improve ability to grasp and manipulate tools and utensils at home and work.    Time 8    Period Weeks    Status On-going                   Plan - 02/05/22 1021     Clinical Impression Statement A: Initiated myofascial release and manual techniques to right wrist and shoulder regions to decrease fascial restrictions and improve joint mobility. Completed P/ROM to wrist and shoulder, A/ROM shoulder completed supine and A/ROM for wrist completed at tabletop. Weighted wrist stretches completed using a 2# weight. Provided x-small edema glove today as small has become too loose. Verbal cuing for form and technique, positional adaptations completed as needed during exercises.    Body Structure / Function / Physical Skills ADL;Endurance;UE functional use;Fascial restriction;Pain;ROM;IADL;Strength;Edema;Coordination;Scar mobility    Plan P: Follow up on HEP    OT Home Exercise Plan eval: table slides, finger A/ROM, edema management; 2/21: shoulder and wrist A/ROM    Consulted and Agree with Plan of Care Patient             Patient will benefit from skilled therapeutic intervention in order  to improve the following deficits and impairments:   Body Structure / Function / Physical Skills: ADL, Endurance, UE functional use, Fascial restriction, Pain, ROM, IADL, Strength, Edema, Coordination, Scar mobility       Visit Diagnosis: Acute pain of right shoulder  Other symptoms and signs involving the musculoskeletal system  Stiffness of right shoulder, not elsewhere classified  Pain in right wrist  Stiffness of right wrist, not elsewhere classified  Other lack of coordination    Problem List Patient Active Problem List   Diagnosis Date Noted   History of kidney stones 08/13/2018   History of gross hematuria 08/13/2018   Epistaxis, recurrent 08/22/2017   Gastroesophageal reflux disease 08/22/2017   Pharyngeal dysphagia 08/22/2017    Genetic testing 01/20/2017   Breast cancer of lower-outer quadrant of left female breast ([place]) 01/03/2017   Family history of breast cancer    Prediabetes 10/10/2016   Hypertriglyceridemia 04/05/2016   BMI 32.0-32.9,adult 09/22/2015   Abdominal pain, right upper quadrant 07/11/2015    Guadelupe Sabin, OTR/L  [phone] 02/05/2022, 10:31 AM  [place], [place], [address] Phone: [phone]   Fax:  [phone]  Name: Shannon Logan MRN: [id number] Date of Birth: Feb 23, 1966

## 2022-02-06 ENCOUNTER — Encounter (HOSPITAL_COMMUNITY): Payer: Self-pay | Admitting: Occupational Therapy

## 2022-02-06 ENCOUNTER — Ambulatory Visit (HOSPITAL_COMMUNITY): Payer: PRIVATE HEALTH INSURANCE | Admitting: Occupational Therapy

## 2022-02-06 DIAGNOSIS — M25511 Pain in right shoulder: Secondary | ICD-10-CM

## 2022-02-06 DIAGNOSIS — M25611 Stiffness of right shoulder, not elsewhere classified: Secondary | ICD-10-CM

## 2022-02-06 DIAGNOSIS — M25631 Stiffness of right wrist, not elsewhere classified: Secondary | ICD-10-CM

## 2022-02-06 DIAGNOSIS — R29898 Other symptoms and signs involving the musculoskeletal system: Secondary | ICD-10-CM

## 2022-02-06 DIAGNOSIS — M25531 Pain in right wrist: Secondary | ICD-10-CM

## 2022-02-06 DIAGNOSIS — R278 Other lack of coordination: Secondary | ICD-10-CM

## 2022-02-06 NOTE — Therapy (Signed)
[place], [place], [address] Phone: [phone]   Fax:  [phone]  Occupational Therapy Treatment  Patient Details  Name: Shannon Logan MRN: [id number] Date of Birth: 07/06/1966 Referring Provider (OT): Dr. Berenice Primas   Encounter Date: 02/06/2022   OT End of Session - 02/06/22 0939     Visit Number 3    Number of Visits 16    Date for OT Re-Evaluation 03/24/22   Mini-reassessment 02/22/2022   Authorization Type [employer], $4.00 copay    Authorization Time Period 1st 12 visits pre-approved, need approval for additional visits    Authorization - Visit Number 3    Authorization - Number of Visits 12    OT Start Time [phone]    OT Stop Time [phone]    OT Time Calculation (min) 41 min    Activity Tolerance Patient tolerated treatment well    Behavior During Therapy [facility] for tasks assessed/performed             Past Medical History:  Diagnosis Date   Breast cancer of lower-outer quadrant of left female breast ([place]) 01/03/2017   Cancer ([place]) 11/22/2016   Breast    Family history of breast cancer    GERD (gastroesophageal reflux disease)    History of radiation therapy 07/30/17- 09/10/17   Left Breast 50 gy in 25 fractions, Left Breast boost 10 Gy in 5 fractions.    Personal history of chemotherapy    from 3/18 till 7/18   Personal history of radiation therapy     Past Surgical History:  Procedure Laterality Date   BREAST LUMPECTOMY Left 01/22/17 and 01/30/17   BREAST LUMPECTOMY WITH AXILLARY LYMPH NODE DISSECTION Left 01/30/2017   Procedure: RE-EXCISION OF LEFT BREAST LUMPECTOMY AND LEFT AXILLARY LYMPH NODE DISSECTION;  Surgeon: Coralie Keens, MD;  Location: [place];  Service: General;  Laterality: Left;   BREAST LUMPECTOMY WITH RADIOACTIVE SEED AND SENTINEL LYMPH NODE BIOPSY Left 01/22/2017   Procedure: BREAST LUMPECTOMY WITH RADIOACTIVE SEED AND SENTINEL LYMPH NODE BIOPSY;  Surgeon:  Coralie Keens, MD;  Location: [place];  Service: General;  Laterality: Left;   COLONOSCOPY N/A 10/26/2015   Procedure: COLONOSCOPY;  Surgeon: Rogene Houston, MD;  [facility];  Service: Endoscopy;  Laterality: N/A;   DILATION AND CURETTAGE OF UTERUS     ESOPHAGOGASTRODUODENOSCOPY N/A 10/26/2015   Procedure: ESOPHAGOGASTRODUODENOSCOPY (EGD);  Surgeon: Rogene Houston, MD;  [facility];  Service: Endoscopy;  Laterality: N/A;  1:00   PORTACATH PLACEMENT Right 01/22/2017   Procedure: INSERTION PORT-A-CATH;  Surgeon: Coralie Keens, MD;  Location: [place];  Service: General;  Laterality: Right;   URETHRAL DILATION      There were no vitals filed for this visit.   Subjective Assessment - 02/06/22 0858     Subjective  S: The glove fits better.    Currently in Pain? Yes    Pain Score 1     Pain Location Shoulder    Pain Orientation Right    Pain Descriptors / Indicators Sore    Pain Type Acute pain    Pain Radiating Towards N/A    Pain Onset Yesterday    Pain Frequency Occasional    Aggravating Factors  certain movements    Pain Relieving Factors rest    Effect of Pain on Daily Activities min effect on ADLs    Multiple Pain Sites No  [facility] OT Assessment - 02/06/22 0857       Assessment   Medical Diagnosis ORIF of right intraarticular distal radius fracture; Right brachioradialis tendon open tenotomy; right shoulder pain      Precautions   Precautions Other (comment)    Precaution Comments no lifting more than 10#                      OT Treatments/Exercises (OP) - 02/06/22 0901       Exercises   Exercises Shoulder;Hand;Wrist      Shoulder Exercises: Supine   Protraction PROM;5 reps;AROM;10 reps    Horizontal ABduction PROM;5 reps;AROM;10 reps    External Rotation PROM;5 reps;AROM;10 reps    Internal Rotation PROM;5 reps;AROM;10 reps    Flexion PROM;5 reps;AROM;10 reps     ABduction PROM;5 reps;AROM;10 reps      Shoulder Exercises: Seated   Extension AROM;10 reps    Row AROM;10 reps      Weighted Stretch Over Towel Roll   Wrist Flexion - Weighted Stretch 2 pounds;60 seconds   2 reps   Wrist Extension - Weighted Stretch 2 pounds;60 seconds   2 reps     Wrist Exercises   Wrist Flexion PROM;5 reps;AROM;10 reps    Wrist Extension PROM;5 reps;AROM;10 reps    Wrist Radial Deviation PROM;5 reps;AROM;10 reps    Wrist Ulnar Deviation PROM;5 reps;AROM;10 reps    Other wrist exercises forearm supination/pronation, P/ROM, 5X; A/ROM 10X      Additional Wrist Exercises   Hand Gripper with Large Beads all beads gripper at 11#    Hand Gripper with Medium Beads all beads gripper at 11#      Manual Therapy   Manual Therapy Myofascial release    Manual therapy comments completed separately from therapeutic exercises    Myofascial Release myofascial release to right dorsal and volar wrist and forearm, upper arm, anterior shoulder, and trapezius regions to decrease pain and fascial restrictions and increase joint ROM.                      OT Short Term Goals - 02/05/22 1030       OT SHORT TERM GOAL #1   Title Pt will be provided with and educated on HEP to improve mobility required for ADL and work task completion.    Time 4    Period Weeks    Status On-going    Target Date 02/22/22      OT SHORT TERM GOAL #2   Title Pt will increase right shoulder and wrist P/ROM to [facility] to improve ability to perform dressing tasks with minimal compensatory strategies.    Time 4    Period Weeks    Status On-going      OT SHORT TERM GOAL #3   Title Pt will increase right shoulder strength to 3+/5 or greater to improve ability to reach for items above shoulder level during ADLs.    Time 4    Period Weeks    Status On-going      OT SHORT TERM GOAL #4   Title Pt will decrease edema in RUE to trace amounts to improve mobility required for making a fist.    Time 4     Period Weeks    Status On-going      OT SHORT TERM GOAL #5   Title Pt will increase right grip strength by 20# and pinch strength by 3# to improve ability to hold dishes  or drink from a full thermos/water bottle.    Time 4    Period Weeks    Status On-going               OT Long Term Goals - 02/05/22 1030       OT LONG TERM GOAL #1   Title Pt will return to highest level of functioning using her RUE as dominant during ADL tasks.    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    Time 8    Period Weeks    Status On-going      OT LONG TERM GOAL #3   Title Pt will increase right shoulder, forearm, and wrist strength to 4+/5 or greater to improve ability to grasp and lift items to various heights and positions.    Time 8    Period Weeks    Status On-going      OT LONG TERM GOAL #4   Title Pt will decrease fascial restrictions in RUE to min amounts or less to improve mobility required for ADL and work task completion.    Time 8    Period Weeks    Status On-going      OT LONG TERM GOAL #5   Title Pt will increase right grip strength by 40# and pinch strength by 6# to improve ability to grasp and manipulate tools and utensils at home and work.    Time 8    Period Weeks    Status On-going                   Plan - 02/06/22 1610     Clinical Impression Statement A: Continued with myofascial release and passive stretching to right wrist and shoulder regions. Pt with decreased swelling from yesterday on observation, reports the XS edema glove does fit better. Continued with shoulder A/ROM and added scapular A/ROM. Completed weighted wrist stretches, A/ROM, and added gripper task for grip strengthening today. Verbal cuing for form and technique.    Body Structure / Function /  Physical Skills ADL;Endurance;UE functional use;Fascial restriction;Pain;ROM;IADL;Strength;Edema;Coordination;Scar mobility    Plan P: Continue with myofascial release, A/ROM, grip strengthening activities, trial pinch task    OT Home Exercise Plan eval: table slides, finger A/ROM, edema management; 2/21: shoulder and wrist A/ROM    Consulted and Agree with Plan of Care Patient             Patient will benefit from skilled therapeutic intervention in order to improve the following deficits and impairments:   Body Structure / Function / Physical Skills: ADL, Endurance, UE functional use, Fascial restriction, Pain, ROM, IADL, Strength, Edema, Coordination, Scar mobility       Visit Diagnosis: Acute pain of right shoulder  Other symptoms and signs involving the musculoskeletal system  Stiffness of right shoulder, not elsewhere classified  Pain in right wrist  Stiffness of right wrist, not elsewhere classified  Other lack of coordination    Problem List Patient Active Problem List   Diagnosis Date Noted   History of kidney stones 08/13/2018   History of gross hematuria 08/13/2018   Epistaxis, recurrent 08/22/2017   Gastroesophageal reflux disease 08/22/2017   Pharyngeal dysphagia 08/22/2017   Genetic testing 01/20/2017  Breast cancer of lower-outer quadrant of left female breast ([place]) 01/03/2017   Family history of breast cancer    Prediabetes 10/10/2016   Hypertriglyceridemia 04/05/2016   BMI 32.0-32.9,adult 09/22/2015   Abdominal pain, right upper quadrant 07/11/2015    Shannon Logan, OTR/L  [phone] 02/06/2022, 9:43 AM  [place], [place], [address] Phone: [phone]   Fax:  [phone]  Name: Shannon Logan MRN: [id number] Date of Birth: 18-Apr-1966

## 2022-02-11 ENCOUNTER — Other Ambulatory Visit: Payer: Self-pay

## 2022-02-11 ENCOUNTER — Encounter (HOSPITAL_COMMUNITY): Payer: Self-pay

## 2022-02-11 ENCOUNTER — Ambulatory Visit (HOSPITAL_COMMUNITY): Payer: PRIVATE HEALTH INSURANCE

## 2022-02-11 DIAGNOSIS — M25631 Stiffness of right wrist, not elsewhere classified: Secondary | ICD-10-CM

## 2022-02-11 DIAGNOSIS — M25531 Pain in right wrist: Secondary | ICD-10-CM

## 2022-02-11 DIAGNOSIS — R29898 Other symptoms and signs involving the musculoskeletal system: Secondary | ICD-10-CM

## 2022-02-11 DIAGNOSIS — M25511 Pain in right shoulder: Secondary | ICD-10-CM

## 2022-02-11 DIAGNOSIS — R278 Other lack of coordination: Secondary | ICD-10-CM

## 2022-02-11 DIAGNOSIS — M25611 Stiffness of right shoulder, not elsewhere classified: Secondary | ICD-10-CM

## 2022-02-11 NOTE — Therapy (Signed)
[place], [place], [address] Phone: [phone]   Fax:  [phone]  Occupational Therapy Treatment  Patient Details  Name: Shannon Logan MRN: [id number] Date of Birth: 1966/08/01 Referring Provider (OT): Dr. Berenice Primas   Encounter Date: 02/11/2022   OT End of Session - 02/11/22 1752     Visit Number 4    Number of Visits 16    Date for OT Re-Evaluation 03/24/22   Mini-reassessment 02/22/2022   Authorization Type [employer], $4.00 copay    Authorization Time Period 1st 12 visits pre-approved, need approval for additional visits    Authorization - Visit Number 4    Authorization - Number of Visits 12    OT Start Time 1120    OT Stop Time 1202    OT Time Calculation (min) 42 min    Activity Tolerance Patient tolerated treatment well    Behavior During Therapy [facility] for tasks assessed/performed             Past Medical History:  Diagnosis Date   Breast cancer of lower-outer quadrant of left female breast ([place]) 01/03/2017   Cancer ([place]) 11/22/2016   Breast    Family history of breast cancer    GERD (gastroesophageal reflux disease)    History of radiation therapy 07/30/17- 09/10/17   Left Breast 50 gy in 25 fractions, Left Breast boost 10 Gy in 5 fractions.    Personal history of chemotherapy    from 3/18 till 7/18   Personal history of radiation therapy     Past Surgical History:  Procedure Laterality Date   BREAST LUMPECTOMY Left 01/22/17 and 01/30/17   BREAST LUMPECTOMY WITH AXILLARY LYMPH NODE DISSECTION Left 01/30/2017   Procedure: RE-EXCISION OF LEFT BREAST LUMPECTOMY AND LEFT AXILLARY LYMPH NODE DISSECTION;  Surgeon: Coralie Keens, MD;  Location: [place];  Service: General;  Laterality: Left;   BREAST LUMPECTOMY WITH RADIOACTIVE SEED AND SENTINEL LYMPH NODE BIOPSY Left 01/22/2017   Procedure: BREAST LUMPECTOMY WITH RADIOACTIVE SEED AND SENTINEL LYMPH NODE BIOPSY;  Surgeon:  Coralie Keens, MD;  Location: [place];  Service: General;  Laterality: Left;   COLONOSCOPY N/A 10/26/2015   Procedure: COLONOSCOPY;  Surgeon: Rogene Houston, MD;  [facility];  Service: Endoscopy;  Laterality: N/A;   DILATION AND CURETTAGE OF UTERUS     ESOPHAGOGASTRODUODENOSCOPY N/A 10/26/2015   Procedure: ESOPHAGOGASTRODUODENOSCOPY (EGD);  Surgeon: Rogene Houston, MD;  [facility];  Service: Endoscopy;  Laterality: N/A;  1:00   PORTACATH PLACEMENT Right 01/22/2017   Procedure: INSERTION PORT-A-CATH;  Surgeon: Coralie Keens, MD;  Location: [place];  Service: General;  Laterality: Right;   URETHRAL DILATION      There were no vitals filed for this visit.   Subjective Assessment - 02/11/22 1122     Subjective  S: Today I was wearing my edema glove and a tight exam glove at work.    Currently in Pain? Yes    Pain Score 1     Pain Location Hand    Pain Orientation Right    Pain Descriptors / Indicators Sore    Pain Type Acute pain    Pain Onset More than a month ago    Pain Frequency Occasional    Aggravating Factors  certain movements    Pain Relieving Factors rest    Effect of Pain on Daily Activities min affect  [facility] OT Assessment - 02/11/22 1749       Assessment   Medical Diagnosis ORIF of right intraarticular distal radius fracture; Right brachioradialis tendon open tenotomy; right shoulder pain      Precautions   Precautions Other (comment)    Precaution Comments no lifting more than 10#                      OT Treatments/Exercises (OP) - 02/11/22 1152       Exercises   Exercises Shoulder;Hand;Wrist      Shoulder Exercises: Supine   Protraction PROM;5 reps    Horizontal ABduction PROM;5 reps    External Rotation PROM;5 reps    Internal Rotation PROM;5 reps    Flexion PROM;5 reps    ABduction PROM;5 reps      Weighted Stretch Over Towel Roll   Supination -  Weighted Stretch 2 pounds;60 seconds   2 sets   Wrist Flexion - Weighted Stretch 2 pounds;60 seconds   2 sets   Wrist Extension - Weighted Stretch 2 pounds;60 seconds   2 sets     Wrist Exercises   Other wrist exercises Wrist extension stretch at wall; 2x20"      Manual Therapy   Manual Therapy Myofascial release    Manual therapy comments completed separately from therapeutic exercises    Myofascial Release myofascial release to right dorsal and volar wrist and forearm, upper arm, anterior shoulder, and trapezius regions to decrease pain and fascial restrictions and increase joint ROM.                    OT Education - 02/11/22 1751     Education Details wrist extension stretch using wall. Starting overhead slide hand down wall keeping palm in contact with the wall.    Person(s) Educated Patient    Methods Explanation;Demonstration;Handout    Comprehension Verbalized understanding;Returned demonstration              OT Short Term Goals - 02/05/22 1030       OT SHORT TERM GOAL #1   Title Pt will be provided with and educated on HEP to improve mobility required for ADL and work task completion.    Time 4    Period Weeks    Status On-going    Target Date 02/22/22      OT SHORT TERM GOAL #2   Title Pt will increase right shoulder and wrist P/ROM to [facility] to improve ability to perform dressing tasks with minimal compensatory strategies.    Time 4    Period Weeks    Status On-going      OT SHORT TERM GOAL #3   Title Pt will increase right shoulder strength to 3+/5 or greater to improve ability to reach for items above shoulder level during ADLs.    Time 4    Period Weeks    Status On-going      OT SHORT TERM GOAL #4   Title Pt will decrease edema in RUE to trace amounts to improve mobility required for making a fist.    Time 4    Period Weeks    Status On-going      OT SHORT TERM GOAL #5   Title Pt will increase right grip strength by 20# and pinch strength  by 3# to improve ability to hold dishes or drink from a full thermos/water bottle.    Time 4    Period Weeks    Status  On-going               OT Long Term Goals - 02/05/22 1030       OT LONG TERM GOAL #1   Title Pt will return to highest level of functioning using her RUE as dominant during ADL tasks.    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    Time 8    Period Weeks    Status On-going      OT LONG TERM GOAL #3   Title Pt will increase right shoulder, forearm, and wrist strength to 4+/5 or greater to improve ability to grasp and lift items to various heights and positions.    Time 8    Period Weeks    Status On-going      OT LONG TERM GOAL #4   Title Pt will decrease fascial restrictions in RUE to min amounts or less to improve mobility required for ADL and work task completion.    Time 8    Period Weeks    Status On-going      OT LONG TERM GOAL #5   Title Pt will increase right grip strength by 40# and pinch strength by 6# to improve ability to grasp and manipulate tools and utensils at home and work.    Time 8    Period Weeks    Status On-going                   Plan - 02/11/22 1752     Clinical Impression Statement A: Pt reports that she was able to pick up a 16.9 oz. water bottle from her cup holder in the car with her right hand! Focused on manual techniques to decrease edema and fascial restrictions prior to stretching. Focused on ROM over strength during session as patient reports this is her area that is the most difficult. Provided wrist extension stretch to HEP. VC for form and technique. Pt demonstrate functional P/ROM with her right shoulder.    Body Structure / Function / Physical Skills ADL;Endurance;UE functional use;Fascial  restriction;Pain;ROM;IADL;Strength;Edema;Coordination;Scar mobility    Plan P: Work on increasing ROM in the wrist and forearm. grip and pinch    OT Home Exercise Plan eval: table slides, finger A/ROM, edema management; 2/21: shoulder and wrist A/ROM 2/27: wrist extension stretch    Consulted and Agree with Plan of Care Patient             Patient will benefit from skilled therapeutic intervention in order to improve the following deficits and impairments:   Body Structure / Function / Physical Skills: ADL, Endurance, UE functional use, Fascial restriction, Pain, ROM, IADL, Strength, Edema, Coordination, Scar mobility       Visit Diagnosis: Acute pain of right shoulder  Stiffness of right shoulder, not elsewhere classified  Other symptoms and signs involving the musculoskeletal system  Pain in right wrist  Stiffness of right wrist, not elsewhere classified  Other lack of coordination    Problem List Patient Active Problem List   Diagnosis Date Noted   History of kidney stones 08/13/2018   History of gross hematuria 08/13/2018   Epistaxis, recurrent 08/22/2017   Gastroesophageal reflux disease 08/22/2017   Pharyngeal dysphagia 08/22/2017  Genetic testing 01/20/2017   Breast cancer of lower-outer quadrant of left female breast ([place]) 01/03/2017   Family history of breast cancer    Prediabetes 10/10/2016   Hypertriglyceridemia 04/05/2016   BMI 32.0-32.9,adult 09/22/2015   Abdominal pain, right upper quadrant 07/11/2015    Ailene Ravel, OTR/L,CBIS  [phone]  02/11/2022, 5:56 PM  [place], [place], [address] Phone: [phone]   Fax:  [phone]  Name: Shannon Logan MRN: [id number] Date of Birth: Feb 13, 1966

## 2022-02-11 NOTE — Patient Instructions (Signed)
°  Try to complete twice a day.  Keep elbow straight. Start up overhead then slide down the wall until you feel a stretch. Hold for 20-30 seconds. Complete 2 times.

## 2022-02-12 ENCOUNTER — Ambulatory Visit (HOSPITAL_COMMUNITY): Payer: PRIVATE HEALTH INSURANCE | Admitting: Occupational Therapy

## 2022-02-13 ENCOUNTER — Other Ambulatory Visit: Payer: Self-pay

## 2022-02-13 ENCOUNTER — Ambulatory Visit (HOSPITAL_COMMUNITY): Payer: PRIVATE HEALTH INSURANCE | Attending: Orthopaedic Surgery

## 2022-02-13 DIAGNOSIS — R278 Other lack of coordination: Secondary | ICD-10-CM | POA: Diagnosis present

## 2022-02-13 DIAGNOSIS — M25511 Pain in right shoulder: Secondary | ICD-10-CM | POA: Insufficient documentation

## 2022-02-13 DIAGNOSIS — M25611 Stiffness of right shoulder, not elsewhere classified: Secondary | ICD-10-CM | POA: Insufficient documentation

## 2022-02-13 DIAGNOSIS — M25631 Stiffness of right wrist, not elsewhere classified: Secondary | ICD-10-CM | POA: Insufficient documentation

## 2022-02-13 DIAGNOSIS — R29898 Other symptoms and signs involving the musculoskeletal system: Secondary | ICD-10-CM | POA: Insufficient documentation

## 2022-02-13 DIAGNOSIS — M25531 Pain in right wrist: Secondary | ICD-10-CM | POA: Insufficient documentation

## 2022-02-13 NOTE — Therapy (Signed)
[place], [place], [address] Phone: [phone]   Fax:  [phone]  Occupational Therapy Treatment  Patient Details  Name: FELISA ZECHMAN MRN: [id number] Date of Birth: [birth year] Referring Provider (OT): Dr. Berenice Primas   Encounter Date: 02/13/2022   OT End of Session - 02/13/22 1636     Visit Number 5    Number of Visits 16    Date for OT Re-Evaluation 03/24/22   Mini-reassessment 02/22/2022   Authorization Type [employer], $4.00 copay    Authorization Time Period 1st 12 visits pre-approved, need approval for additional visits    Authorization - Visit Number 5    Authorization - Number of Visits 12    OT Start Time 1600    OT Stop Time 1638    OT Time Calculation (min) 38 min    Activity Tolerance Patient tolerated treatment well    Behavior During Therapy [facility] for tasks assessed/performed             Past Medical History:  Diagnosis Date   Breast cancer of lower-outer quadrant of left female breast ([place]) 01/03/2017   Cancer ([place]) 11/22/2016   Breast    Family history of breast cancer    GERD (gastroesophageal reflux disease)    History of radiation therapy 07/30/17- 09/10/17   Left Breast 50 gy in 25 fractions, Left Breast boost 10 Gy in 5 fractions.    Personal history of chemotherapy    from 3/18 till 7/18   Personal history of radiation therapy     Past Surgical History:  Procedure Laterality Date   BREAST LUMPECTOMY Left 01/22/17 and 01/30/17   BREAST LUMPECTOMY WITH AXILLARY LYMPH NODE DISSECTION Left 01/30/2017   Procedure: RE-EXCISION OF LEFT BREAST LUMPECTOMY AND LEFT AXILLARY LYMPH NODE DISSECTION;  Surgeon: Coralie Keens, MD;  Location: [place];  Service: General;  Laterality: Left;   BREAST LUMPECTOMY WITH RADIOACTIVE SEED AND SENTINEL LYMPH NODE BIOPSY Left 01/22/2017   Procedure: BREAST LUMPECTOMY WITH RADIOACTIVE SEED AND SENTINEL LYMPH NODE BIOPSY;  Surgeon:  Coralie Keens, MD;  Location: [place];  Service: General;  Laterality: Left;   COLONOSCOPY N/A 10/26/2015   Procedure: COLONOSCOPY;  Surgeon: Rogene Houston, MD;  [facility];  Service: Endoscopy;  Laterality: N/A;   DILATION AND CURETTAGE OF UTERUS     ESOPHAGOGASTRODUODENOSCOPY N/A 10/26/2015   Procedure: ESOPHAGOGASTRODUODENOSCOPY (EGD);  Surgeon: Rogene Houston, MD;  [facility];  Service: Endoscopy;  Laterality: N/A;  1:00   PORTACATH PLACEMENT Right 01/22/2017   Procedure: INSERTION PORT-A-CATH;  Surgeon: Coralie Keens, MD;  Location: [place];  Service: General;  Laterality: Right;   URETHRAL DILATION      There were no vitals filed for this visit.   Subjective Assessment - 02/13/22 1629     Subjective  S: I'm sore from the work day    Currently in Pain? Yes    Pain Score 2     Pain Location Hand    Pain Orientation Right    Pain Descriptors / Indicators Sore    Pain Type Acute pain    Pain Onset More than a month ago    Pain Frequency Occasional    Aggravating Factors  certain movements    Pain Relieving Factors rest    Effect of Pain on Daily Activities min-mod effect    Multiple Pain Sites No  [facility] OT Assessment - 02/13/22 1622       Assessment   Medical Diagnosis ORIF of right intraarticular distal radius fracture; Right brachioradialis tendon open tenotomy; right shoulder pain      Precautions   Precautions Other (comment)    Precaution Comments no lifting more than 10#                      OT Treatments/Exercises (OP) - 02/13/22 1622       Exercises   Exercises Shoulder;Hand;Wrist;Theraputty      Wrist Exercises   Wrist Flexion AROM;5 reps    Wrist Extension PROM;5 reps    Wrist Radial Deviation PROM;5 reps    Wrist Ulnar Deviation PROM;5 reps      Hand Exercises   Other Hand Exercises Utilized red resistive clothespins with a 3 point pinch to pick up  25 high resistive sponges and place in container.    Other Hand Exercises Utilized pvc pipe to press circles into flattened yellow putty focusing on shoulder/UB strength, grip strength, and wrist flexion/extension      Theraputty   Theraputty - Flatten yellow standing    Theraputty - Roll yellow      Manual Therapy   Manual Therapy Myofascial release    Manual therapy comments completed separately from therapeutic exercises    Myofascial Release myofascial release to right dorsal and volar wrist and forearm, to decrease pain and fascial restrictions and increase joint ROM.                    OT Education - 02/13/22 1635     Education Details yellow putty - grip and pinch    Person(s) Educated Patient    Methods Explanation;Demonstration;Handout;Verbal cues    Comprehension Verbalized understanding;Returned demonstration              OT Short Term Goals - 02/05/22 1030       OT SHORT TERM GOAL #1   Title Pt will be provided with and educated on HEP to improve mobility required for ADL and work task completion.    Time 4    Period Weeks    Status On-going    Target Date 02/22/22      OT SHORT TERM GOAL #2   Title Pt will increase right shoulder and wrist P/ROM to [facility] to improve ability to perform dressing tasks with minimal compensatory strategies.    Time 4    Period Weeks    Status On-going      OT SHORT TERM GOAL #3   Title Pt will increase right shoulder strength to 3+/5 or greater to improve ability to reach for items above shoulder level during ADLs.    Time 4    Period Weeks    Status On-going      OT SHORT TERM GOAL #4   Title Pt will decrease edema in RUE to trace amounts to improve mobility required for making a fist.    Time 4    Period Weeks    Status On-going      OT SHORT TERM GOAL #5   Title Pt will increase right grip strength by 20# and pinch strength by 3# to improve ability to hold dishes or drink from a full thermos/water bottle.     Time 4    Period Weeks    Status On-going               OT Long Term Goals -  02/05/22 1030       OT LONG TERM GOAL #1   Title Pt will return to highest level of functioning using her RUE as dominant during ADL tasks.    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    Time 8    Period Weeks    Status On-going      OT LONG TERM GOAL #3   Title Pt will increase right shoulder, forearm, and wrist strength to 4+/5 or greater to improve ability to grasp and lift items to various heights and positions.    Time 8    Period Weeks    Status On-going      OT LONG TERM GOAL #4   Title Pt will decrease fascial restrictions in RUE to min amounts or less to improve mobility required for ADL and work task completion.    Time 8    Period Weeks    Status On-going      OT LONG TERM GOAL #5   Title Pt will increase right grip strength by 40# and pinch strength by 6# to improve ability to grasp and manipulate tools and utensils at home and work.    Time 8    Period Weeks    Status On-going                   Plan - 02/13/22 1636     Clinical Impression Statement A: Manual techniques completed to address fascial restrictions in the forearm and wrist. Focused on pinch and grip strength. VC for form and technique. Provided yellow putty for HEP. More range was noted in wrist during dynamic stretching and active, functional activities versus static stretching.    Body Structure / Function / Physical Skills ADL;Endurance;UE functional use;Fascial restriction;Pain;ROM;IADL;Strength;Edema;Coordination;Scar mobility    Plan P: Follow up on HEP. Continue working on wrist ROM (Use wrist flexion/extension bar with the appropriate weight amount tolerated.)    OT Home Exercise Plan eval: table slides,  finger A/ROM, edema management; 2/21: shoulder and wrist A/ROM 2/27: wrist extension stretch 3/1: yellow putty - grip and pinch    Consulted and Agree with Plan of Care Patient             Patient will benefit from skilled therapeutic intervention in order to improve the following deficits and impairments:   Body Structure / Function / Physical Skills: ADL, Endurance, UE functional use, Fascial restriction, Pain, ROM, IADL, Strength, Edema, Coordination, Scar mobility       Visit Diagnosis: Acute pain of right shoulder  Stiffness of right shoulder, not elsewhere classified  Other symptoms and signs involving the musculoskeletal system  Pain in right wrist  Other lack of coordination  Stiffness of right wrist, not elsewhere classified    Problem List Patient Active Problem List   Diagnosis Date Noted   History of kidney stones 08/13/2018   History of gross hematuria 08/13/2018   Epistaxis, recurrent 08/22/2017   Gastroesophageal reflux disease 08/22/2017   Pharyngeal dysphagia 08/22/2017   Genetic testing 01/20/2017   Breast cancer of lower-outer quadrant of left female breast ([place]) 01/03/2017   Family history of breast cancer    Prediabetes 10/10/2016   Hypertriglyceridemia 04/05/2016   BMI 32.0-32.9,adult 09/22/2015  Abdominal pain, right upper quadrant 07/11/2015    Ailene Ravel, OTR/L,CBIS  [phone]  02/13/2022, 5:48 PM  [place] [address] [place], [place], [address] Phone: [phone]   Fax:  [phone]  Name: ANALYS RYDEN MRN: [id number] Date of Birth: 02/23/1966

## 2022-02-13 NOTE — Patient Instructions (Signed)
Theraputty Home Exercise Program  Complete 1-2 times a day.  putty squeeze  Pt. should squeeze putty in hand trying to keep it round by rotating putty after each squeeze. push fingers through putty to palm each time. Complete for ___3-5___ minutes.   PUTTY KEY GRIP  Hold the putty at the top of your hand. Squeeze the putty between your thumb and the side of your 2nd finger as shown. Complete for ___3-5_____ minutes.    PUTTY 3 JAW CHUCK  Roll up some putty into a ball then flatten it. Then, firmly squeeze it with your first 3 fingers as shown. Complete for __3-5____ minutes.           

## 2022-02-15 ENCOUNTER — Ambulatory Visit (HOSPITAL_COMMUNITY): Payer: PRIVATE HEALTH INSURANCE | Admitting: Occupational Therapy

## 2022-02-19 ENCOUNTER — Ambulatory Visit (HOSPITAL_COMMUNITY): Payer: PRIVATE HEALTH INSURANCE | Admitting: Occupational Therapy

## 2022-02-19 ENCOUNTER — Other Ambulatory Visit: Payer: Self-pay

## 2022-02-19 ENCOUNTER — Encounter (HOSPITAL_COMMUNITY): Payer: Self-pay | Admitting: Occupational Therapy

## 2022-02-19 DIAGNOSIS — M25511 Pain in right shoulder: Secondary | ICD-10-CM | POA: Diagnosis not present

## 2022-02-19 DIAGNOSIS — M25531 Pain in right wrist: Secondary | ICD-10-CM

## 2022-02-19 DIAGNOSIS — R278 Other lack of coordination: Secondary | ICD-10-CM

## 2022-02-19 DIAGNOSIS — R29898 Other symptoms and signs involving the musculoskeletal system: Secondary | ICD-10-CM

## 2022-02-19 NOTE — Therapy (Signed)
[facility], [place], [address] Phone: [phone]   Fax:  [phone]  Occupational Therapy Treatment  Patient Details  Name: Shannon Logan MRN: [id number] Date of Birth: [birth year] Referring Provider (OT): Dr. Berenice Primas   Encounter Date: 02/19/2022   OT End of Session - 02/19/22 1559     Visit Number 6    Number of Visits 16    Date for OT Re-Evaluation 03/24/22   Mini-reassessment 02/22/2022   Authorization Type [employer], $4.00 copay    Authorization Time Period 1st 12 visits pre-approved, need approval for additional visits    Authorization - Visit Number 6    Authorization - Number of Visits 12    OT Start Time 1520    OT Stop Time 1558    OT Time Calculation (min) 38 min    Activity Tolerance Patient tolerated treatment well    Behavior During Therapy [facility] for tasks assessed/performed             Past Medical History:  Diagnosis Date   Breast cancer of lower-outer quadrant of left female breast ([place]) 01/03/2017   Cancer ([place]) 11/22/2016   Breast    Family history of breast cancer    GERD (gastroesophageal reflux disease)    History of radiation therapy 07/30/17- 09/10/17   Left Breast 50 gy in 25 fractions, Left Breast boost 10 Gy in 5 fractions.    Personal history of chemotherapy    from 3/18 till 7/18   Personal history of radiation therapy     Past Surgical History:  Procedure Laterality Date   BREAST LUMPECTOMY Left 01/22/17 and 01/30/17   BREAST LUMPECTOMY WITH AXILLARY LYMPH NODE DISSECTION Left 01/30/2017   Procedure: RE-EXCISION OF LEFT BREAST LUMPECTOMY AND LEFT AXILLARY LYMPH NODE DISSECTION;  Surgeon: Coralie Keens, MD;  Location: [place];  Service: General;  Laterality: Left;   BREAST LUMPECTOMY WITH RADIOACTIVE SEED AND SENTINEL LYMPH NODE BIOPSY Left 01/22/2017   Procedure: BREAST LUMPECTOMY WITH RADIOACTIVE SEED AND SENTINEL LYMPH NODE BIOPSY;  Surgeon:  Coralie Keens, MD;  Location: [place];  Service: General;  Laterality: Left;   COLONOSCOPY N/A 10/26/2015   Procedure: COLONOSCOPY;  Surgeon: Rogene Houston, MD;  [facility];  Service: Endoscopy;  Laterality: N/A;   DILATION AND CURETTAGE OF UTERUS     ESOPHAGOGASTRODUODENOSCOPY N/A 10/26/2015   Procedure: ESOPHAGOGASTRODUODENOSCOPY (EGD);  Surgeon: Rogene Houston, MD;  [facility];  Service: Endoscopy;  Laterality: N/A;  1:00   PORTACATH PLACEMENT Right 01/22/2017   Procedure: INSERTION PORT-A-CATH;  Surgeon: Coralie Keens, MD;  Location: [place];  Service: General;  Laterality: Right;   URETHRAL DILATION      There were no vitals filed for this visit.   Subjective Assessment - 02/19/22 1512     Subjective  S: I had to use my other glove for one day but now the xs has been fine.    Currently in Pain? No/denies                [facility] OT Assessment - 02/19/22 1512       Assessment   Medical Diagnosis ORIF of right intraarticular distal radius fracture; Right brachioradialis tendon open tenotomy; right shoulder pain      Precautions   Precautions Other (comment)    Precaution Comments no lifting more than 10#  OT Treatments/Exercises (OP) - 02/19/22 1524       Exercises   Exercises Shoulder;Hand;Wrist;Theraputty      Weighted Stretch Over Towel Roll   Supination - Weighted Stretch 2 pounds;60 seconds   2 reps   Wrist Flexion - Weighted Stretch 3 pounds;60 seconds   2 reps   Wrist Extension - Weighted Stretch 2 pounds;60 seconds   2 reps   Radial Deviation - Weighted Stretch 3 pounds;60 seconds      Wrist Exercises   Wrist Flexion AROM;10 reps    Wrist Extension AROM;10 reps    Wrist Radial Deviation AROM;10 reps    Wrist Ulnar Deviation AROM;10 reps    Other wrist exercises Wrist flexion stretch, prayer stretch for extension, 3x20"      Additional Wrist Exercises    Sponges Pt used green clothespin and 3 point pinch to stack 4 piles of 5 sponges. Pt then using green clothespin and lateral pinch to replace 20 sponges into bucket.    Hand Gripper with Large Beads all beads gripper at 18# vertical    Hand Gripper with Medium Beads all beads gripper at 18# vertical      Fine Motor Coordination (Hand/Wrist)   Fine Motor Coordination Manipulating coins    Manipulating coins Pt holding coins in palm and translating to fingertips to place into container working on thumb ROM.                      OT Short Term Goals - 02/05/22 1030       OT SHORT TERM GOAL #1   Title Pt will be provided with and educated on HEP to improve mobility required for ADL and work task completion.    Time 4    Period Weeks    Status On-going    Target Date 02/22/22      OT SHORT TERM GOAL #2   Title Pt will increase right shoulder and wrist P/ROM to [facility] to improve ability to perform dressing tasks with minimal compensatory strategies.    Time 4    Period Weeks    Status On-going      OT SHORT TERM GOAL #3   Title Pt will increase right shoulder strength to 3+/5 or greater to improve ability to reach for items above shoulder level during ADLs.    Time 4    Period Weeks    Status On-going      OT SHORT TERM GOAL #4   Title Pt will decrease edema in RUE to trace amounts to improve mobility required for making a fist.    Time 4    Period Weeks    Status On-going      OT SHORT TERM GOAL #5   Title Pt will increase right grip strength by 20# and pinch strength by 3# to improve ability to hold dishes or drink from a full thermos/water bottle.    Time 4    Period Weeks    Status On-going               OT Long Term Goals - 02/05/22 1030       OT LONG TERM GOAL #1   Title Pt will return to highest level of functioning using her RUE as dominant during ADL tasks.    Time 8    Period Weeks    Status On-going    Target Date 03/24/22      OT LONG TERM  GOAL #2  Title Pt will increase right shoulder, forearm, and wrist A/ROM to 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    Time 8    Period Weeks    Status On-going      OT LONG TERM GOAL #3   Title Pt will increase right shoulder, forearm, and wrist strength to 4+/5 or greater to improve ability to grasp and lift items to various heights and positions.    Time 8    Period Weeks    Status On-going      OT LONG TERM GOAL #4   Title Pt will decrease fascial restrictions in RUE to min amounts or less to improve mobility required for ADL and work task completion.    Time 8    Period Weeks    Status On-going      OT LONG TERM GOAL #5   Title Pt will increase right grip strength by 40# and pinch strength by 6# to improve ability to grasp and manipulate tools and utensils at home and work.    Time 8    Period Weeks    Status On-going                   Plan - 02/19/22 1553     Clinical Impression Statement A: Pt reporting she is back to assisting duties at work and is able to do most duties with exception of squeezing the impression gun. Continued with wrist A/ROM and weighted wrist stretches. Increased hand gripper resistance to 18# today, also increased clothespin resistance to green for all pinch tasks. Verbal cuing for form and technique.    Body Structure / Function / Physical Skills ADL;Endurance;UE functional use;Fascial restriction;Pain;ROM;IADL;Strength;Edema;Coordination;Scar mobility    Plan P: Continue working on wrist ROM and grip strength, check in on shoulder and complete ROM or stretching as needed.    OT Home Exercise Plan eval: table slides, finger A/ROM, edema management; 2/21: shoulder and wrist A/ROM 2/27: wrist extension stretch 3/1: yellow putty - grip and pinch    Consulted and Agree with Plan of Care Patient             Patient will benefit from skilled therapeutic intervention in order to improve the following deficits  and impairments:   Body Structure / Function / Physical Skills: ADL, Endurance, UE functional use, Fascial restriction, Pain, ROM, IADL, Strength, Edema, Coordination, Scar mobility       Visit Diagnosis: Other symptoms and signs involving the musculoskeletal system  Pain in right wrist  Other lack of coordination    Problem List Patient Active Problem List   Diagnosis Date Noted   History of kidney stones 08/13/2018   History of gross hematuria 08/13/2018   Epistaxis, recurrent 08/22/2017   Gastroesophageal reflux disease 08/22/2017   Pharyngeal dysphagia 08/22/2017   Genetic testing 01/20/2017   Breast cancer of lower-outer quadrant of left female breast ([place]) 01/03/2017   Family history of breast cancer    Prediabetes 10/10/2016   Hypertriglyceridemia 04/05/2016   BMI 32.0-32.9,adult 09/22/2015   Abdominal pain, right upper quadrant 07/11/2015    Guadelupe Sabin, OTR/L  [phone] 02/19/2022, 4:00 PM  [facility] [address] [place], [place], [address] Phone: [phone]   Fax:  [phone]  Name: Shannon Logan MRN: [id number] Date of Birth: 1966-09-12

## 2022-02-21 ENCOUNTER — Encounter (HOSPITAL_COMMUNITY): Payer: Self-pay | Admitting: Occupational Therapy

## 2022-02-21 ENCOUNTER — Other Ambulatory Visit: Payer: Self-pay

## 2022-02-21 ENCOUNTER — Ambulatory Visit (HOSPITAL_COMMUNITY): Payer: PRIVATE HEALTH INSURANCE | Admitting: Occupational Therapy

## 2022-02-21 DIAGNOSIS — M25511 Pain in right shoulder: Secondary | ICD-10-CM

## 2022-02-21 DIAGNOSIS — M25611 Stiffness of right shoulder, not elsewhere classified: Secondary | ICD-10-CM

## 2022-02-21 DIAGNOSIS — M25531 Pain in right wrist: Secondary | ICD-10-CM

## 2022-02-21 DIAGNOSIS — R29898 Other symptoms and signs involving the musculoskeletal system: Secondary | ICD-10-CM

## 2022-02-21 DIAGNOSIS — M25631 Stiffness of right wrist, not elsewhere classified: Secondary | ICD-10-CM

## 2022-02-21 DIAGNOSIS — R278 Other lack of coordination: Secondary | ICD-10-CM

## 2022-02-21 NOTE — Patient Instructions (Signed)
?  1) Flexion Wall Stretch ? ? ? ?Face wall, place affected handon wall in front of you. Slide hand up the wall  ?and lean body in towards the wall. Hold for 10 seconds. Repeat 3-5 times. 1-2 times/day.  ? ? ? ?2) Towel Stretch with Internal Rotation ?  Or    ? ?Gently pull up (or to the side) your affected arm  behind your back with the assist of a towel. Hold 10 seconds, repeat 3-5 times. 1-2 times/day.  ? ? ? ? ? ? ? ? ? ? ? ?3) Corner Stretch  ? ? ?Stand at a corner of a wall, place your arms on the walls with elbows bent. Lean into the corner until a stretch is felt along the front of your chest and/or shoulders. Hold for 10 seconds. Repeat 3-5X, 1-2 times/day.  ? ? ?4) Posterior Capsule Stretch ? ? ? ?Bring the involved arm across chest. Grasp elbow and pull toward chest until you feel a stretch in the back of the upper arm and shoulder. Hold 10 seconds. Repeat 3-5X. Complete 1-2 times/day.  ? ? ?5) Wrist Extensor Stretch:  ?While standing or sitting upright, hold your injured arm straight out in front of you and point your fingers down toward the ground. With the hand of the uninjured arm, grasp the hand of the injured arm, thumb pressing on the palm, and try to bend the wrist further towards your body.  ? ? ? ?6) Wrist Flexor Stretch:  ?While standing or sitting upright, hold injured arm straight out in front of you with palm pointed up. With uninjured arm, grasp hand with fingers on palm and try to stretch the wrist back towards you, palm away from body.  ? ? ? ? ? ? ?

## 2022-02-21 NOTE — Therapy (Signed)
OUTPATIENT OCCUPATIONAL THERAPY TREATMENT NOTE (Mini-Reassessment)   Patient Name: Shannon Logan MRN: [id number] DOB:1966/07/09, 56 y.o., female Today's Date: 02/21/2022  PCP: Elby Showers, DO REFERRING PROVIDER: Berenice Primas, MD   OT End of Session - 02/21/22 1607     Visit Number 7    Number of Visits 16    Date for OT Re-Evaluation 03/24/22   Mini-reassessment 02/22/2022   Authorization Type [employer], $40.00 copay    Authorization Time Period 1st 12 visits pre-approved, need approval for additional visits    Authorization - Visit Number 7    Authorization - Number of Visits 12    OT Start Time 1517    OT Stop Time 1600    OT Time Calculation (min) 43 min    Activity Tolerance Patient tolerated treatment well    Behavior During Therapy WFL for tasks assessed/performed             Past Medical History:  Diagnosis Date   Breast cancer of lower-outer quadrant of left female breast ([place]) 01/03/2017   Cancer ([place]) 11/22/2016   Breast    Family history of breast cancer    GERD (gastroesophageal reflux disease)    History of radiation therapy 07/30/17- 09/10/17   Left Breast 50 gy in 25 fractions, Left Breast boost 10 Gy in 5 fractions.    Personal history of chemotherapy    from 3/18 till 7/18   Personal history of radiation therapy    Past Surgical History:  Procedure Laterality Date   BREAST LUMPECTOMY Left 01/22/17 and 01/30/17   BREAST LUMPECTOMY WITH AXILLARY LYMPH NODE DISSECTION Left 01/30/2017   Procedure: RE-EXCISION OF LEFT BREAST LUMPECTOMY AND LEFT AXILLARY LYMPH NODE DISSECTION;  Surgeon: Coralie Keens, MD;  Location: [place];  Service: General;  Laterality: Left;   BREAST LUMPECTOMY WITH RADIOACTIVE SEED AND SENTINEL LYMPH NODE BIOPSY Left 01/22/2017   Procedure: BREAST LUMPECTOMY WITH RADIOACTIVE SEED AND SENTINEL LYMPH NODE BIOPSY;  Surgeon: Coralie Keens, MD;  Location: [place];  Service: General;   Laterality: Left;   COLONOSCOPY N/A 10/26/2015   Procedure: COLONOSCOPY;  Surgeon: Rogene Houston, MD;  [facility];  Service: Endoscopy;  Laterality: N/A;   DILATION AND CURETTAGE OF UTERUS     ESOPHAGOGASTRODUODENOSCOPY N/A 10/26/2015   Procedure: ESOPHAGOGASTRODUODENOSCOPY (EGD);  Surgeon: Rogene Houston, MD;  [facility];  Service: Endoscopy;  Laterality: N/A;  1:00   PORTACATH PLACEMENT Right 01/22/2017   Procedure: INSERTION PORT-A-CATH;  Surgeon: Coralie Keens, MD;  Location: [place];  Service: General;  Laterality: Right;   URETHRAL DILATION     Patient Active Problem List   Diagnosis Date Noted   History of kidney stones 08/13/2018   History of gross hematuria 08/13/2018   Epistaxis, recurrent 08/22/2017   Gastroesophageal reflux disease 08/22/2017   Pharyngeal dysphagia 08/22/2017   Genetic testing 01/20/2017   Breast cancer of lower-outer quadrant of left female breast ([place]) 01/03/2017   Family history of breast cancer    Prediabetes 10/10/2016   Hypertriglyceridemia 04/05/2016   BMI 32.0-32.9,adult 09/22/2015   Abdominal pain, right upper quadrant 07/11/2015    ONSET DATE: 12/05/21  REFERRING DIAG: s/p ORIF of right distal radius/brachioradialis tenotomy; right shoulder adhesive capsulitis  THERAPY DIAG:  Other symptoms and signs involving the musculoskeletal system  Pain in right wrist  Other lack of coordination  Acute pain of right shoulder  Stiffness of right shoulder, not elsewhere classified  Stiffness of  right wrist, not elsewhere classified   PERTINENT HISTORY: Pt is a 57 y/o female s/p ORIF of right distal radius/brachioradialis tenotomy on 12/05/21 secondary to fall on 11/24/21. Pt began having right shoulder pain after the nerve block used for the surgery.   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   PAIN:  Are you having pain? No    OBJECTIVE:      02/21/22 1519  Assessment  Medical Diagnosis ORIF of right intraarticular distal radius fracture; Right brachioradialis tendon open tenotomy; right shoulder pain  Precautions  Precautions Other (comment)  Precaution Comments no lifting more than 10#  Observation/Other Assessments  Quick DASH  18.18 (38.64 previous)  AROM  AROM Assessment Site Shoulder  Right/Left Shoulder Right  Right Shoulder Flexion 145 Degrees (105 previous)  Right Shoulder ABduction 150 Degrees (78 previous)  Right Shoulder Internal Rotation 90 Degrees (same as previous)  Right Shoulder External Rotation 50 Degrees (30 previous)  Right/Left Forearm Right  Right Forearm Pronation 90 Degrees (55 previous)  Right Forearm Supination 80 Degrees (28 previous)  Right/Left Wrist Right  Right Wrist Extension 35 Degrees (18 previous)  Right Wrist Flexion 34 Degrees (20 previous)  Right Wrist Radial Deviation 20 Degrees (12 previous)  Right Wrist Ulnar Deviation 20 Degrees (8 previous)  PROM  Right Wrist Extension 35 Degrees (25 previous)  Right Wrist Flexion 40 Degrees (30 previous)  Right Wrist Radial Deviation 20 Degrees (14 previous)  Right Wrist Ulnar Deviation 20 Degrees (10 previous)  Right/Left Wrist Right  Strength  Right Hand Lateral Pinch 12 lbs (7)  Right Hand 3 Point Pinch 10 lbs (5)  Right Shoulder Flexion 5/5 (3-/5 previous)  Right Shoulder ABduction 4/5 (3-/5 previous)  Right Shoulder Internal Rotation 5/5 (3/5 previous)  Right Shoulder External Rotation 4+/5 (3-/5 previous)  Overall Strength Comments Assessed seated, er/IR adducted  Strength Assessment Site Shoulder;Wrist;Forearm  Right/Left Shoulder Right  Right/Left hand Right  Right Hand Gross Grasp Functional  Right Hand Grip (lbs) 32 (7 previous)  Right/Left Forearm Right  Right/Left Wrist Right  Right Forearm Supination 4-/5 (not previously assessed)  Right Forearm Pronation 4/5 (not previously assessed)  Right  Wrist Flexion 4-/5 (not previously assessed)  Right Wrist Extension 4-/5 (not previously assessed)  Right Wrist Radial Deviation 4/5 (not previously assessed)  Right Wrist Ulnar Deviation 4/5 (not previously assessed)    TODAY'S TREATMENT:  -Shoulder Exercises:  -Stretches: Flexion at wall, doorway stretch, IR behind back with horizontal towel, cross chest stretch, 2X10" holds.  -Wrist Exercises:  -P/ROM wrist flexion, extension, forearm supination/pronation, 5X each  -Wrist flexion stretch 2X20"  -Wrist extension prayer stretch 2X20" -Hand Exercises:  -Hand gripper: large, medium, and small beads, gripper at 20#, vertical for large and medium beads, horizontal for small beads.    PATIENT EDUCATION: Education details: shoulder stretches, wrist flexion/extension stretches Person educated: Patient Education method: Explanation, Demonstration, and Handouts Education comprehension: verbalized understanding and returned demonstration   HOME EXERCISE PROGRAM   1) Flexion Wall Stretch    Face wall, place affected handon wall in front of you. Slide hand up the wall  and lean body in towards the wall. Hold for 10 seconds. Repeat 3-5 times. 1-2 times/day.     2) Towel Stretch with Internal Rotation      Gently pull up (or to the side) your affected arm  behind your back with the assist of a towel. Hold 10 seconds, repeat 3-5 times.  1-2 times/day.    3) Corner Stretch    Stand at a corner of a wall, place your arms on the walls with elbows bent. Lean into the corner until a stretch is felt along the front of your chest and/or shoulders. Hold for 10 seconds. Repeat 3-5X, 1-2 times/day.    4) Posterior Capsule Stretch    Bring the involved arm across chest. Grasp elbow and pull toward chest until you feel a stretch in the back of the upper arm and shoulder. Hold 10 seconds. Repeat 3-5X. Complete 1-2 times/day.   5) Wrist Extensor Stretch: Hold for 20 seconds, 2X While  standing or sitting upright, hold your injured arm straight out in front of you and point your fingers down toward the ground. With the hand of the uninjured arm, grasp the hand of the injured arm, thumb pressing on the palm, and try to bend the wrist further towards your body.     6) Wrist Flexor Stretch: Hold for 20 seconds, 2X While standing or sitting upright, hold injured arm straight out in front of you with palm pointed up. With uninjured arm, grasp hand with fingers on palm and try to stretch the wrist back towards you, palm away from body.          OT Short Term Goals - 02/21/22 1552       OT SHORT TERM GOAL #1   Title Pt will be provided with and educated on HEP to improve mobility required for ADL and work task completion.    Time 4    Period Weeks    Status Achieved    Target Date 02/22/22      OT SHORT TERM GOAL #2   Title Pt will increase right shoulder and wrist P/ROM to [facility] to improve ability to perform dressing tasks with minimal compensatory strategies.    Baseline 3/9: shoulder is WFL    Time 4    Period Weeks    Status Partially Met      OT SHORT TERM GOAL #3   Title Pt will increase right shoulder strength to 3+/5 or greater to improve ability to reach for items above shoulder level during ADLs.    Time 4    Period Weeks    Status Achieved      OT SHORT TERM GOAL #4   Title Pt will decrease edema in RUE to trace amounts to improve mobility required for making a fist.    Time 4    Period Weeks    Status Achieved      OT SHORT TERM GOAL #5   Title Pt will increase right grip strength by 20# and pinch strength by 3# to improve ability to hold dishes or drink from a full thermos/water bottle.    Time 4    Period Weeks    Status Achieved              OT Long Term Goals - 02/21/22 1556       OT LONG TERM GOAL #1   Title Pt will return to highest level of functioning using her RUE as dominant during ADL tasks.    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  Time 8    Period Weeks    Status Partially Met      OT LONG TERM GOAL #3   Title Pt will increase right shoulder, forearm, and wrist strength to 4+/5 or greater to improve ability to grasp and lift items to various heights and positions.    Time 8    Period Weeks    Status Partially Met      OT LONG TERM GOAL #4   Title Pt will decrease fascial restrictions in RUE to min amounts or less to improve mobility required for ADL and work task completion.    Time 8    Period Weeks    Status On-going      OT LONG TERM GOAL #5   Title Pt will increase right grip strength by 40# and pinch strength by 6# to improve ability to grasp and manipulate tools and utensils at home and work.    Time 8    Period Weeks    Status On-going              Plan - 02/21/22 1607     Clinical Impression Statement A: Mini-reassessment completed this session, pt has met 4/5 STGs with remaining STG partially met, and has also partially met 2 LTGs. Pt demonstrates improvement in shoulder, forearm, and wrist ROM, is achieving shoulder ROM WFL at this time. Shoulder strength has also greatly improved and is now WNL. Pt completing shoulder and wrist stretches today, grip strengthening and increased hand gripper to 20#. Updated HEP. Verbal cuing for form and technique during tasks.    Body Structure / Function / Physical Skills ADL;Endurance;UE functional use;Fascial restriction;Pain;ROM;IADL;Strength;Edema;Coordination;Scar mobility    Plan P: Follow up on HEP, continue with wrist ROM and stretches, increase hand gripper resistance and add pinch task.    OT Home Exercise Plan eval: table slides, finger A/ROM, edema management; 2/21: shoulder and wrist A/ROM 2/27: wrist extension stretch 3/1: yellow putty -  grip and pinch; 3/9: shoulder stretches, wrist flexion/extension stretch    Consulted and Agree with Plan of Care Patient              Guadelupe Sabin, OTR/L  [phone] 02/21/2022, 4:14 PM

## 2022-02-26 ENCOUNTER — Ambulatory Visit (HOSPITAL_COMMUNITY): Payer: PRIVATE HEALTH INSURANCE | Admitting: Occupational Therapy

## 2022-02-26 ENCOUNTER — Other Ambulatory Visit: Payer: Self-pay

## 2022-02-26 ENCOUNTER — Encounter (HOSPITAL_COMMUNITY): Payer: Self-pay | Admitting: Occupational Therapy

## 2022-02-26 DIAGNOSIS — R29898 Other symptoms and signs involving the musculoskeletal system: Secondary | ICD-10-CM

## 2022-02-26 DIAGNOSIS — M25511 Pain in right shoulder: Secondary | ICD-10-CM | POA: Diagnosis not present

## 2022-02-26 DIAGNOSIS — R278 Other lack of coordination: Secondary | ICD-10-CM

## 2022-02-26 DIAGNOSIS — M25531 Pain in right wrist: Secondary | ICD-10-CM

## 2022-02-26 NOTE — Patient Instructions (Signed)
Strengthening Exercises:  ?*Complete exercises using __2__ pound weight, _10___times each, ___1_times per day* ? ?1) WRIST EXTENSION CURLS - TABLE ? ?Hold a small free weight, rest your forearm on a table and bend your wrist up and down with your palm face down as shown. ? ? ? ? ? ?2) WRIST FLEXION CURLS - TABLE ? ?Hold a small free weight, rest your forearm on a table and bend your wrist up and down with your palm face up as shown. ? ? ? ? ?3) FREE WEIGHT RADIAL/ULNAR DEVIATION - TABLE ? ?Hold a small free weight, rest your forearm on a table and bend your wrist up and down with your palm facing towards the side as shown. ? ? ? ? ?4) Pronation ? ?Forearm supported on table with wrist in neutral position. Using a weight, roll wrist so that palm faces downward. Hold for 2 seconds and return to starting position. ? ? ? ? ?5) Supination ? ?Forearm supported on table with wrist in neutral position. Using a weight, roll wrist so that palm is now facing upward. Hold for 2 seconds and return to starting position.  ? ? ? ? ? ?

## 2022-02-26 NOTE — Therapy (Signed)
?OUTPATIENT OCCUPATIONAL THERAPY TREATMENT NOTE ?Discharge Summary  ? ? ?Patient Name: Shannon Logan ?MRN: [id number] ?DOB:1966/04/08, 56 y.o., female ?Today's Date: 02/26/2022 ? ?PCP: Elby Showers, DO ?REFERRING PROVIDER: Berenice Primas, MD ? ? OT End of Session - 02/26/22 1550   ? ? Visit Number 8   ? Number of Visits 16   ? Date for OT Re-Evaluation 03/24/22   ? Authorization Type [employer], $4.00 copay   ? Authorization Time Period 1st 12 visits pre-approved, need approval for additional visits   ? Authorization - Visit Number 8   ? Authorization - Number of Visits 12   ? OT Start Time 1512   ? OT Stop Time 1550   ? OT Time Calculation (min) 38 min   ? Activity Tolerance Patient tolerated treatment well   ? Behavior During Therapy [facility] for tasks assessed/performed   ? ?  ?  ? ?  ? ? ?Past Medical History:  ?Diagnosis Date  ? Breast cancer of lower-outer quadrant of left female breast ([place]) 01/03/2017  ? Cancer ([place]) 11/22/2016  ? Breast   ? Family history of breast cancer   ? GERD (gastroesophageal reflux disease)   ? History of radiation therapy 07/30/17- 09/10/17  ? Left Breast 50 gy in 25 fractions, Left Breast boost 10 Gy in 5 fractions.   ? Personal history of chemotherapy   ? from 3/18 till 7/18  ? Personal history of radiation therapy   ? ?Past Surgical History:  ?Procedure Laterality Date  ? BREAST LUMPECTOMY Left 01/22/17 and 01/30/17  ? BREAST LUMPECTOMY WITH AXILLARY LYMPH NODE DISSECTION Left 01/30/2017  ? Procedure: RE-EXCISION OF LEFT BREAST LUMPECTOMY AND LEFT AXILLARY LYMPH NODE DISSECTION;  Surgeon: Coralie Keens, MD;  Location: [place];  Service: General;  Laterality: Left;  ? BREAST LUMPECTOMY WITH RADIOACTIVE SEED AND SENTINEL LYMPH NODE BIOPSY Left 01/22/2017  ? Procedure: BREAST LUMPECTOMY WITH RADIOACTIVE SEED AND SENTINEL LYMPH NODE BIOPSY;  Surgeon: Coralie Keens, MD;  Location: [place];  Service: General;  Laterality: Left;  ? COLONOSCOPY N/A  10/26/2015  ? Procedure: COLONOSCOPY;  Surgeon: Rogene Houston, MD;  [facility];  Service: Endoscopy;  Laterality: N/A;  ? DILATION AND CURETTAGE OF UTERUS    ? ESOPHAGOGASTRODUODENOSCOPY N/A 10/26/2015  ? Procedure: ESOPHAGOGASTRODUODENOSCOPY (EGD);  Surgeon: Rogene Houston, MD;  [facility];  Service: Endoscopy;  Laterality: N/A;  1:00  ? PORTACATH PLACEMENT Right 01/22/2017  ? Procedure: INSERTION PORT-A-CATH;  Surgeon: Coralie Keens, MD;  Location: [place];  Service: General;  Laterality: Right;  ? URETHRAL DILATION    ? ?Patient Active Problem List  ? Diagnosis Date Noted  ? History of kidney stones 08/13/2018  ? History of gross hematuria 08/13/2018  ? Epistaxis, recurrent 08/22/2017  ? Gastroesophageal reflux disease 08/22/2017  ? Pharyngeal dysphagia 08/22/2017  ? Genetic testing 01/20/2017  ? Breast cancer of lower-outer quadrant of left female breast ([place]) 01/03/2017  ? Family history of breast cancer   ? Prediabetes 10/10/2016  ? Hypertriglyceridemia 04/05/2016  ? BMI 32.0-32.9,adult 09/22/2015  ? Abdominal pain, right upper quadrant 07/11/2015  ? ? ?ONSET DATE: 12/05/21 ? ?REFERRING DIAG: s/p ORIF of right distal radius/brachioradialis tenotomy; right shoulder adhesive capsulitis ? ?THERAPY DIAG:  ?Other symptoms and signs involving the musculoskeletal system ? ?Pain in right wrist ? ?Other lack of coordination ? ? ?PERTINENT HISTORY: Pt is a 56 y/o female s/p ORIF of right distal radius/brachioradialis tenotomy on  12/05/21 secondary to fall on 11/24/21. Pt began having right shoulder pain after the nerve block used for the surgery.  ? ?PRECAUTIONS: No lifting over 10# ? ?SUBJECTIVE: S: I don't really notice it much anymore.  ? ?PAIN:  ?Are you having pain? No ? ? ? ? ?OBJECTIVE:  ? ?TODAY'S TREATMENT:  ?02/26/22 ?-Wrist Exercises: ?            -P/ROM wrist flexion, extension, forearm supination/pronation, 5X each ?            -Wrist flexion stretch 2X20" ?             -Wrist extension prayer stretch 2X20" ? -Strengthening: 2#, wrist flexion, extension, supination 10X each ?-Hand Exercises:  ?-Hand gripper: large, medium, and small beads, gripper at 29#, vertical for large beads and horizontal for medium and small beads.  ?-Pinch: pt using green clothespin and 3 point pinch to stack 4 piles of 5 sponges. Pt then unstacking using green clothespin and lateral pinch.  ?-Red Theraputty: flatten, pinch, using pvc pipe to cut circles into theraputty working on wrist flexion/extension and grip ? ? ? ? ?02/21/22 ?-Shoulder Exercises:  ?-Stretches: Flexion at wall, doorway stretch, IR behind back with horizontal towel, cross chest stretch, 2X10" holds.  ?-Wrist Exercises: ?            -P/ROM wrist flexion, extension, forearm supination/pronation, 5X each ?            -Wrist flexion stretch 2X20" ?            -Wrist extension prayer stretch 2X20" ?-Hand Exercises:  ?-Hand gripper: large, medium, and small beads, gripper at 20#, vertical for large and medium beads, horizontal for small beads.  ? ? ?PATIENT EDUCATION: ?Education details: upgraded to red theraputty for grip strengthening, wrist strengthening ?Person educated: Patient ?Education method: Explanation and Demonstration ?Education comprehension: verbalized understanding and returned demonstration ? ? ?[place] ? AROM Exercises  ?Strengthening Exercises: *Complete exercises using __2__ pound weight, __10__times each, __1__time per day* ? ?1) WRIST EXTENSION CURLS - TABLE ? ?Hold a small free weight, rest your forearm on a table and bend your wrist up and down with your palm face down as shown. ? ? ? ? ? ?2) WRIST FLEXION CURLS - TABLE ? ?Hold a small free weight, rest your forearm on a table and bend your wrist up and down with your palm face up as shown. ? ? ? ? ?3) FREE WEIGHT RADIAL/ULNAR DEVIATION - TABLE ? ?Hold a small free weight, rest your forearm on a table and bend your wrist up and down with your palm  facing towards the side as shown. ? ? ? ? ?4) Pronation ? ?Forearm supported on table with wrist in neutral position. Using a weight, roll wrist so that palm faces downward. Hold for 2 seconds and return to starting position. ? ? ? ? ?5) Supination ? ?Forearm supported on table with wrist in neutral position. Using a weight, roll wrist so that palm is now facing upward. Hold for 2 seconds and return to starting position.  ? ? ? ? ? ? ? ? OT Short Term Goals   ? ?  ? OT SHORT TERM GOAL #1  ? Title Pt will be provided with and educated on HEP to improve mobility required for ADL and work task completion.   ? Time 4   ? Period Weeks   ? Status Achieved   ? Target Date 02/22/22   ?  ?  OT SHORT TERM GOAL #2  ? Title Pt will increase right shoulder and wrist P/ROM to [facility] to improve ability to perform dressing tasks with minimal compensatory strategies.   ? Baseline 3/9: shoulder is WFL   ? Time 4   ? Period Weeks   ? Status Partially Met   ?  ? OT SHORT TERM GOAL #3  ? Title Pt will increase right shoulder strength to 3+/5 or greater to improve ability to reach for items above shoulder level during ADLs.   ? Time 4   ? Period Weeks   ? Status Achieved   ?  ? OT SHORT TERM GOAL #4  ? Title Pt will decrease edema in RUE to trace amounts to improve mobility required for making a fist.   ? Time 4   ? Period Weeks   ? Status Achieved   ?  ? OT SHORT TERM GOAL #5  ? Title Pt will increase right grip strength by 20# and pinch strength by 3# to improve ability to hold dishes or drink from a full thermos/water bottle.   ? Time 4   ? Period Weeks   ? Status Achieved   ? ?  ?  ? ?  ? ? ? OT Long Term Goals  ? ?  ? OT LONG TERM GOAL #1  ? Title Pt will return to highest level of functioning using her RUE as dominant during ADL tasks.   ? Time 8   ? Period Weeks   ? Status Met  ? Target Date 03/24/22   ?  ? OT LONG TERM GOAL #2  ? 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   ? Time 8   ? Period Weeks   ? Status Partially Met   ?  ? OT LONG TERM GOAL #3  ? Title Pt will increase right shoulder, forearm, and wrist strength to 4+/5 or greater

## 2022-02-28 ENCOUNTER — Ambulatory Visit (HOSPITAL_COMMUNITY): Payer: PRIVATE HEALTH INSURANCE | Admitting: Occupational Therapy

## 2022-03-05 ENCOUNTER — Ambulatory Visit (HOSPITAL_COMMUNITY): Payer: PRIVATE HEALTH INSURANCE | Admitting: Occupational Therapy

## 2022-03-07 ENCOUNTER — Encounter (HOSPITAL_COMMUNITY): Payer: PRIVATE HEALTH INSURANCE | Admitting: Occupational Therapy

## 2022-03-12 ENCOUNTER — Encounter (HOSPITAL_COMMUNITY): Payer: PRIVATE HEALTH INSURANCE | Admitting: Occupational Therapy

## 2022-03-14 ENCOUNTER — Encounter (HOSPITAL_COMMUNITY): Payer: PRIVATE HEALTH INSURANCE | Admitting: Occupational Therapy

## 2022-03-19 ENCOUNTER — Encounter (HOSPITAL_COMMUNITY): Payer: PRIVATE HEALTH INSURANCE | Admitting: Occupational Therapy

## 2022-03-21 ENCOUNTER — Ambulatory Visit (HOSPITAL_COMMUNITY): Payer: PRIVATE HEALTH INSURANCE | Admitting: Occupational Therapy
